# Patient Record
Sex: Male | Born: 1940
Health system: Southern US, Community
[De-identification: ages and names within clinical notes are randomized; demographics above are authoritative.]

## PROBLEM LIST (undated history)

## (undated) DIAGNOSIS — F1011 Alcohol abuse, in remission: Secondary | ICD-10-CM

## (undated) DIAGNOSIS — I219 Acute myocardial infarction, unspecified: Secondary | ICD-10-CM

## (undated) DIAGNOSIS — E119 Type 2 diabetes mellitus without complications: Secondary | ICD-10-CM

## (undated) DIAGNOSIS — I340 Nonrheumatic mitral (valve) insufficiency: Secondary | ICD-10-CM

## (undated) DIAGNOSIS — E785 Hyperlipidemia, unspecified: Secondary | ICD-10-CM

## (undated) DIAGNOSIS — J449 Chronic obstructive pulmonary disease, unspecified: Secondary | ICD-10-CM

## (undated) DIAGNOSIS — I509 Heart failure, unspecified: Secondary | ICD-10-CM

## (undated) DIAGNOSIS — I2510 Atherosclerotic heart disease of native coronary artery without angina pectoris: Secondary | ICD-10-CM

## (undated) DIAGNOSIS — E669 Obesity, unspecified: Secondary | ICD-10-CM

## (undated) DIAGNOSIS — I4892 Unspecified atrial flutter: Secondary | ICD-10-CM

## (undated) DIAGNOSIS — I4891 Unspecified atrial fibrillation: Secondary | ICD-10-CM

## (undated) DIAGNOSIS — M109 Gout, unspecified: Secondary | ICD-10-CM

## (undated) DIAGNOSIS — I639 Cerebral infarction, unspecified: Secondary | ICD-10-CM

## (undated) DIAGNOSIS — M1990 Unspecified osteoarthritis, unspecified site: Secondary | ICD-10-CM

## (undated) DIAGNOSIS — I10 Essential (primary) hypertension: Secondary | ICD-10-CM

## (undated) HISTORY — DX: Heart failure, unspecified: I50.9

## (undated) HISTORY — DX: Essential (primary) hypertension: I10

## (undated) HISTORY — DX: Hyperlipidemia, unspecified: E78.5

## (undated) HISTORY — PX: INGUINAL HERNIA REPAIR: SUR1180

## (undated) HISTORY — DX: Nonrheumatic mitral (valve) insufficiency: I34.0

## (undated) HISTORY — DX: Unspecified atrial fibrillation: I48.91

## (undated) HISTORY — DX: Atherosclerotic heart disease of native coronary artery without angina pectoris: I25.10

## (undated) HISTORY — DX: Alcohol abuse, in remission: F10.11

## (undated) HISTORY — DX: Unspecified atrial flutter: I48.92

## (undated) HISTORY — DX: Obesity, unspecified: E66.9

## (undated) HISTORY — PX: CATARACT EXTRACTION W/ INTRAOCULAR LENS  IMPLANT, BILATERAL: SHX1307

---

## 1985-07-12 DIAGNOSIS — I219 Acute myocardial infarction, unspecified: Secondary | ICD-10-CM

## 1985-07-12 HISTORY — PX: INCISION AND DRAINAGE ABSCESS ANAL: SUR669

## 1985-07-12 HISTORY — PX: CARDIAC CATHETERIZATION: SHX172

## 1985-07-12 HISTORY — DX: Acute myocardial infarction, unspecified: I21.9

## 1997-07-12 HISTORY — PX: CORONARY ARTERY BYPASS GRAFT: SHX141

## 1998-02-03 ENCOUNTER — Inpatient Hospital Stay (HOSPITAL_COMMUNITY): Admission: EM | Admit: 1998-02-03 | Discharge: 1998-02-15 | Payer: Self-pay | Admitting: Emergency Medicine

## 2000-01-10 DIAGNOSIS — I639 Cerebral infarction, unspecified: Secondary | ICD-10-CM

## 2000-01-10 HISTORY — PX: CAROTID ENDARTERECTOMY: SUR193

## 2000-01-10 HISTORY — DX: Cerebral infarction, unspecified: I63.9

## 2000-01-25 ENCOUNTER — Encounter: Payer: Self-pay | Admitting: Vascular Surgery

## 2000-01-27 ENCOUNTER — Ambulatory Visit (HOSPITAL_COMMUNITY): Admission: RE | Admit: 2000-01-27 | Discharge: 2000-01-27 | Payer: Self-pay | Admitting: Vascular Surgery

## 2000-01-27 ENCOUNTER — Encounter: Payer: Self-pay | Admitting: Vascular Surgery

## 2000-02-08 ENCOUNTER — Encounter: Payer: Self-pay | Admitting: Vascular Surgery

## 2000-02-08 ENCOUNTER — Inpatient Hospital Stay: Admission: RE | Admit: 2000-02-08 | Discharge: 2000-02-09 | Payer: Self-pay | Admitting: Vascular Surgery

## 2000-02-08 ENCOUNTER — Encounter (INDEPENDENT_AMBULATORY_CARE_PROVIDER_SITE_OTHER): Payer: Self-pay

## 2000-03-08 ENCOUNTER — Encounter: Payer: Self-pay | Admitting: Thoracic Surgery (Cardiothoracic Vascular Surgery)

## 2004-09-25 ENCOUNTER — Ambulatory Visit: Payer: Self-pay | Admitting: Cardiology

## 2004-09-26 ENCOUNTER — Encounter: Payer: Self-pay | Admitting: Cardiology

## 2004-09-30 ENCOUNTER — Encounter: Payer: Self-pay | Admitting: Cardiology

## 2004-11-09 ENCOUNTER — Ambulatory Visit: Payer: Self-pay | Admitting: Cardiology

## 2005-05-28 ENCOUNTER — Encounter: Payer: Self-pay | Admitting: Cardiology

## 2009-06-23 ENCOUNTER — Encounter: Payer: Self-pay | Admitting: Cardiology

## 2009-08-25 ENCOUNTER — Encounter: Payer: Self-pay | Admitting: Cardiology

## 2009-09-24 ENCOUNTER — Encounter (INDEPENDENT_AMBULATORY_CARE_PROVIDER_SITE_OTHER): Payer: Self-pay | Admitting: *Deleted

## 2009-09-24 ENCOUNTER — Ambulatory Visit: Payer: Self-pay | Admitting: Cardiology

## 2009-09-24 DIAGNOSIS — E785 Hyperlipidemia, unspecified: Secondary | ICD-10-CM

## 2009-09-24 DIAGNOSIS — E1169 Type 2 diabetes mellitus with other specified complication: Secondary | ICD-10-CM

## 2009-09-24 DIAGNOSIS — I509 Heart failure, unspecified: Secondary | ICD-10-CM | POA: Insufficient documentation

## 2009-09-24 DIAGNOSIS — J449 Chronic obstructive pulmonary disease, unspecified: Secondary | ICD-10-CM | POA: Insufficient documentation

## 2009-09-24 DIAGNOSIS — I2589 Other forms of chronic ischemic heart disease: Secondary | ICD-10-CM | POA: Insufficient documentation

## 2009-09-24 DIAGNOSIS — I080 Rheumatic disorders of both mitral and aortic valves: Secondary | ICD-10-CM

## 2009-09-24 DIAGNOSIS — I679 Cerebrovascular disease, unspecified: Secondary | ICD-10-CM | POA: Insufficient documentation

## 2009-09-24 DIAGNOSIS — I482 Chronic atrial fibrillation, unspecified: Secondary | ICD-10-CM

## 2009-09-24 DIAGNOSIS — I4892 Unspecified atrial flutter: Secondary | ICD-10-CM | POA: Insufficient documentation

## 2009-09-24 DIAGNOSIS — F1010 Alcohol abuse, uncomplicated: Secondary | ICD-10-CM | POA: Insufficient documentation

## 2009-09-24 DIAGNOSIS — I079 Rheumatic tricuspid valve disease, unspecified: Secondary | ICD-10-CM | POA: Insufficient documentation

## 2009-09-24 DIAGNOSIS — Z951 Presence of aortocoronary bypass graft: Secondary | ICD-10-CM

## 2009-09-24 DIAGNOSIS — E119 Type 2 diabetes mellitus without complications: Secondary | ICD-10-CM

## 2009-09-25 DIAGNOSIS — E669 Obesity, unspecified: Secondary | ICD-10-CM

## 2009-10-02 ENCOUNTER — Ambulatory Visit: Payer: Self-pay | Admitting: Cardiology

## 2009-10-02 ENCOUNTER — Encounter: Payer: Self-pay | Admitting: Cardiology

## 2010-08-11 NOTE — Assessment & Plan Note (Signed)
Summary: NP/LAST EST 2006   Visit Type:  Follow-up Referring Provider:  Dr. Leanna Battles Primary Provider:  Dr. Kirstie Peri  CC:  Arrhythmia.  History of Present Illness: The patient presents for follow up of his known he is status post bypass grafting in 1994. We last saw him in 2006. At that time he had atrial fibrillation. He apparently has been in this rhythm or flutter since that time and has been managed with anticoagulation. There has been some bradycardia noted. However, the patient does not notice this rhythm. He was referred here as he was recently noted to have an irregular heart rhythm. He does not feel this rhythm and denies any palpitations, presyncope or syncope. He does not report any chest discomfort, neck or arm discomfort. He does get dyspnea with exertion which is noticed more by his wife and by him. She thinks this is slowly progressive. He does do some exercising with walking or other activities about one half hour per day. He says he can tolerate this and he is not describing PND or orthopnea.  Preventive Screening-Counseling & Management  Alcohol-Tobacco     Smoking Status: quit > 6 months     Year Quit: 1987  Comments: smoked for about 30 yrs and quit in 1987. He quit chewing tobacco around 1988  Current Medications (verified): 1)  Lipitor 10 Mg Tabs (Atorvastatin Calcium) .... Take One Tablet By Mouth Daily. 2)  Glyburide 5 Mg Tabs (Glyburide) .... Take 1 Tablet By Mouth Two Times A Day 3)  Allopurinol 300 Mg Tabs (Allopurinol) .... Take 1 Tablet By Mouth Once A Day 4)  Quinapril Hcl 40 Mg Tabs (Quinapril Hcl) .... Take 1 Tablet By Mouth Once A Day 5)  Furosemide 40 Mg Tabs (Furosemide) .... Take One Tablet By Mouth Daily. 6)  Warfarin Sodium 5 Mg Tabs (Warfarin Sodium) .... Use As Directed By Anticoagulation Clinic 7)  Hydrocodone-Acetaminophen 5-500 Mg Tabs (Hydrocodone-Acetaminophen) .... As Needed Pain 8)  Fish Oil 1000 Mg Caps (Omega-3 Fatty Acids) ....  Take 1 Capsule By Mouth Two Times A Day 9)  Garlic Oil 1000 Mg Caps (Garlic) .... Take 1 Capsule By Mouth Two Times A Day 10)  Aspirin 81 Mg Tbec (Aspirin) .... Take One Tablet By Mouth Daily 11)  Amlodipine Besylate 5 Mg Tabs (Amlodipine Besylate) .... Take One Tablet By Mouth Daily 12)  Co Q-10 100 Mg Caps (Coenzyme Q10) .... Take 1 Capsule By Mouth Once A Day 13)  Prednisolone Acetate 1 % Susp (Prednisolone Acetate) .... Use Eye Drops As Directed 14)  Ketorolac Tromethamine 0.4 % Soln (Ketorolac Tromethamine) .... Use Eye Drops As Directed  Allergies (verified): 1)  * Some Type of Rhythm Medicaiton 2)  Augmentin  Comments:  Nurse/Medical Assistant: The patient's medications were reviewed with the patient and were updated in the Medication List. Pt brought a list of medications to office visit.  Cyril Loosen, RN, BSN (September 24, 2009 3:03 PM)  Past History:  Past Medical History: CHF (EF 37%) ATRIAL FIBRILLATION MITRAL VALVE INSUFF ATRIAL FLUTTER TOBACCO ABUSE (Previous) CAD DM  HYPERLIPIDEMIA UNSPECIFIED CEREBROVASCULAR DISEASE OBESITY HISTORY OF GOUT Hypertension Sleep apnea EtOH use (previous)  Past Surgical History: Right carotid endarterectomy with Dacron patch (July 2001) CABG (1999 x 4) Cataract Left anal hernia repair  Family History: Father died age 66 with MI Mother died age 3 unknown cause  Social History: FORMER TOBACCO STOPPED IN THE 80'S DISABLED FROM [employer] AFTER A STROKE 2001 [employer] EtOH Smoking  Status:  quit > 6 months  Review of Systems       Right heal pain.  Otherwise as stated in the history of present illness negative for all other systems.  Vital Signs:  Patient profile:   70 year old male Height:      70 inches Weight:      226 pounds BMI:     32.54 Pulse rate:   80 / minute BP sitting:   134 / 76  (left arm) Cuff size:   regular  Vitals Entered By: Cyril Loosen, RN, BSN (September 24, 2009 2:54 PM)  Nutrition  Counseling: Patient's BMI is greater than 25 and therefore counseled on weight management options. CC: Arrhythmia Comments Pt states he had CABG in '99 but hasn't had f/u since then b/c he's been doing well. He states he was told at the time of his recent eye surgery that his hear beat was irregular.    Physical Exam  General:  Well developed, well nourished, in no acute distress. Head:  normocephalic and atraumatic Eyes:  PERRLA/EOM intact; conjunctiva and lids normal. Mouth:  Oral mucosa normal. Neck:  Neck supple, no JVD. No masses, thyromegaly or abnormal cervical nodes. Chest Wall:  well-healed surgical scar Lungs:  Clear bilaterally to auscultation and percussion. Abdomen:  Bowel sounds positive; abdomen soft and non-tender without masses, organomegaly, or hernias noted. No hepatosplenomegaly, obese Msk:  Back normal, normal gait. Muscle strength and tone normal. Extremities:  No clubbing or cyanosis. Neurologic:  Alert and oriented x 3. Skin:  Intact without lesions or rashes. Cervical Nodes:  no significant adenopathy Axillary Nodes:  no significant adenopathy Psych:  Normal affect.   Detailed Cardiovascular Exam  Neck    Carotids: Carotids full and equal bilaterally without bruits.      Neck Veins: Normal, no JVD.    Heart    Inspection: no deformities or lifts noted.      Palpation: normal PMI with no thrills palpable.      Auscultation: regular rate and rhythm, S1, S2 without murmurs, rubs, gallops, or clicks.    Vascular    Abdominal Aorta: no palpable masses, pulsations, or audible bruits.      Femoral Pulses: normal femoral pulses bilaterally.      Pedal Pulses: normal pedal pulses bilaterally.      Radial Pulses: normal radial pulses bilaterally.      Peripheral Circulation: no clubbing, cyanosis, or edema noted with normal capillary refill.     EKG  Procedure date:  09/25/2009  Findings:      atrial fibrillation, slow ventricular rates, axis within  normal limits, intervals within normal limits, no acute ST-T wave changes.  Impression & Recommendations:  Problem # 1:  ATRIAL FIBRILLATION (ICD-427.31) The patient does not feel this rhythm. He tolerates Coumadin. He has a slow rate but no presyncope or syncope. No change in therapy is indicated. Orders: EKG w/ Interpretation (93000) Nuclear Med (Nuc Med)  Problem # 2:  CAD (ICD-414.00) He has CAD status post CABG. He has dyspnea. He never really had chest discomfort as an anginal equivalent. It was always shortness of breath. His bypass grafts are now 72 years old. He needs further testing with stress perfusion imaging. He does have an abnormal baseline study to compare this to in 2006. Orders: Nuclear Med (Nuc Med)  Problem # 3:  HYPERLIPIDEMIA (ICD-272.4) I don't see a recent lipid profile. I'll defer to his primary care with a goal LDL less than 70 and  HDL greater than 40.  Problem # 4:  OBESITY, UNSPECIFIED (ICD-278.00) He and I had a long discussion about weight loss with diet and exercise.  Other Orders: Carotid Duplex (Carotid Duplex)  Patient Instructions: 1)  Your physician has requested that you have an exercise stress myoview.  For further information please visit [email or web address]  Please follow instruction sheet, as given. 2)  Your physician recommends that you continue on your current medications as directed. Please refer to the Current Medication list given to you today. 3)  Your physician wants you to follow-up in: 12months. You will receive a reminder letter in the mail about two months in advance. If you don't receive a letter, please call our office to schedule the follow-up appointment. 4)  Your physician has requested that you have a carotid duplex. This test is an ultrasound of the carotid arteries in your neck. It looks at blood flow through these arteries that supply the brain with blood. Allow one hour for this exam. There are no restrictions or special  instructions.

## 2010-08-11 NOTE — Letter (Signed)
Summary: [facility] D/C DR. Weyman Pedro  [facility] D[facility]C DR. Weyman Pedro   Imported By: Zachary George 09/24/2009 14:02:49  _____________________________________________________________________  External Attachment:    Type:   Image     Comment:   External Document

## 2010-08-11 NOTE — Op Note (Signed)
Summary: Operative Report  Operative Report   Imported By: Zachary George 09/24/2009 14:03:20  _____________________________________________________________________  External Attachment:    Type:   Image     Comment:   External Document

## 2010-08-11 NOTE — Consult Note (Signed)
Summary: CARDIOLOGY CONSULT/ [facility]  CARDIOLOGY CONSULT/ [facility]   Imported By: Zachary George 09/24/2009 13:51:04  _____________________________________________________________________  External Attachment:    Type:   Image     Comment:   External Document

## 2010-08-11 NOTE — Letter (Signed)
Summary: Pharmacist, community at [facility]. [address]   [place], [place] [address]   Phone: [phone]  Fax: [phone]      [facility] Cardiovascular Services  Cardiolite Stress Test     [facility]  Your doctor has ordered a Cardiolite Stress Test to help determine the condition of your heart during stress. If you take blood pressure medicine, ask your doctor if you should take it the day of your test. You may take your medication the day of your test. You should not have anything to eat or drink at least 4 hours before your test is scheduled, and no caffeine (coffee, tea, decaf. or chocolate) for 24 hours before your test.   You will need to register at the Outpatient/Main Entrance at the hospital 30 minutes before your appointment time. It is a good idea to bring a copy of your order with you. They will direct you to the Diagnostic Imaging (Radiology) Department.  You will be asked to undress from the waist up and be given a hospital gown to wear, so dress comfortably from the waist down, for example:    Sweat pants, shorts or skirt   Rubber-soled lace up shoes (i.e. tennis shoes)  Plan on about three hours from registration to release from the hospital.    Date of Test:              Time of Test

## 2010-08-11 NOTE — Op Note (Signed)
Summary: Operative Report  Operative Report   Imported By: Zachary George 09/24/2009 14:42:35  _____________________________________________________________________  External Attachment:    Type:   Image     Comment:   External Document

## 2010-08-11 NOTE — Letter (Signed)
Summary: Internal Other/ PATIENT HISTORY FORM  Internal Other/ PATIENT HISTORY FORM   Imported By: Dorise Hiss 09/26/2009 12:10:22  _____________________________________________________________________  External Attachment:    Type:   Image     Comment:   External Document

## 2010-08-11 NOTE — Letter (Signed)
Summary: Discharge Summary  Discharge Summary   Imported By: Zachary George 09/24/2009 14:41:26  _____________________________________________________________________  External Attachment:    Type:   Image     Comment:   External Document

## 2010-11-09 ENCOUNTER — Ambulatory Visit (INDEPENDENT_AMBULATORY_CARE_PROVIDER_SITE_OTHER): Payer: Medicare Other | Admitting: Cardiology

## 2010-11-09 ENCOUNTER — Encounter: Payer: Self-pay | Admitting: Cardiology

## 2010-11-09 ENCOUNTER — Encounter: Payer: Self-pay | Admitting: *Deleted

## 2010-11-09 VITALS — BP 143/75 | HR 52 | Ht 70.0 in | Wt 226.0 lb

## 2010-11-09 DIAGNOSIS — E785 Hyperlipidemia, unspecified: Secondary | ICD-10-CM

## 2010-11-09 DIAGNOSIS — I2510 Atherosclerotic heart disease of native coronary artery without angina pectoris: Secondary | ICD-10-CM

## 2010-11-09 DIAGNOSIS — I4891 Unspecified atrial fibrillation: Secondary | ICD-10-CM

## 2010-11-09 DIAGNOSIS — E669 Obesity, unspecified: Secondary | ICD-10-CM

## 2010-11-09 NOTE — Progress Notes (Signed)
HPI The patient presents for followup of atrial fibrillation and known coronary disease. Since I last saw him he has had no new cardiovascular complaints. He does not notice his atrial fibrillation. He exercises one half hour daily. With this he denies any chest pressure, neck or arm discomfort. He denies any palpitations, and has had no presyncope. He has had no syncopal episodes. There is no shortness of breath, PND or orthopnea though he did have a pneumonia recently.  Allergies  Allergen Reactions  . WJX:[id number]+HYQMVHQIO+[id number] Acid+Aspartame     Current Outpatient Prescriptions  Medication Sig Dispense Refill  . allopurinol (ZYLOPRIM) 300 MG tablet Take 300 mg by mouth daily.        Marland Kitchen amLODipine (NORVASC) 5 MG tablet Take 5 mg by mouth daily.        Marland Kitchen aspirin 81 MG tablet Take 81 mg by mouth daily.        Marland Kitchen atorvastatin (LIPITOR) 10 MG tablet Take 10 mg by mouth daily.        . Cinnamon 500 MG TABS Take 2 tablets by mouth daily.        . Coenzyme Q10 60 MG TABS Take 2 tablets by mouth daily.        . furosemide (LASIX) 40 MG tablet Take 40 mg by mouth daily.        Marland Kitchen glyBURIDE (DIABETA) 5 MG tablet Take 5 mg by mouth 2 (two) times daily with a meal.        . HYDROcodone-acetaminophen (VICODIN) 5-500 MG per tablet Take 1 tablet by mouth every 6 (six) hours as needed.        . Omega-3 Fatty Acids (FISH OIL) 1000 MG CAPS Take 1 capsule by mouth 2 (two) times daily.        . quinapril (ACCUPRIL) 40 MG tablet Take 40 mg by mouth at bedtime.        Marland Kitchen warfarin (COUMADIN) 5 MG tablet Take 5 mg by mouth as directed.        Marland Kitchen DISCONTD: Coenzyme Q10 (COQ-10) 100 MG CAPS Take 1 capsule by mouth daily.        Marland Kitchen DISCONTD: Garlic Oil 1000 MG CAPS Take 1 capsule by mouth 2 (two) times daily.        Marland Kitchen DISCONTD: ketorolac (ACULAR) 0.4 % SOLN Place 1 drop into both eyes 4 (four) times daily.        Marland Kitchen DISCONTD: prednisoLONE acetate (PRED FORTE) 1 % ophthalmic suspension Place 1 drop into both eyes  4 (four) times daily.          Past Medical History  Diagnosis Date  . Congestive heart failure, unspecified     (EF 37%)  . Atrial fibrillation   . Atrial flutter   . Mitral valve insufficiency   . Tobacco abuse     PREVIOUS  . Coronary atherosclerosis of native coronary artery   . Type II or unspecified type diabetes mellitus without mention of complication, not stated as uncontrolled   . Hyperlipidemia   . CVA (cerebral infarction)     UNSPECIFIED  . Obesity   . Gout     HSITORY OF  . Unspecified essential hypertension   . Sleep apnea   . History of ETOH abuse     Past Surgical History  Procedure Date  . Carotid endarterectomy JULY 2001    RIGHT WITH DACRON PATCH  . Coronary artery bypass graft 1999    X 4  . Cataract extraction   .  Hernia repair     LEFT ANAL    ROS:  As stated in the HPI and negative for all other systems.  PHYSICAL EXAM BP 143/75  Pulse 52  Ht 5\' 10"  (1.778 m)  Wt 226 lb (102.513 kg)  BMI 32.43 kg/m2 GENERAL:  Well appearing HEENT:  Pupils equal round and reactive, fundi not visualized, oral mucosa unremarkable NECK:  No jugular venous distention, waveform within normal limits, carotid upstroke brisk and symmetric, no bruits, no thyromegaly, CEA scar LYMPHATICS:  No cervical, inguinal adenopathy LUNGS:  Clear to auscultation bilaterally BACK:  No CVA tenderness CHEST:  Well healed sternotomy scar. HEART:  PMI not displaced or sustained,S1 and S2 within normal limits, no S3, no S4, no clicks, no rubs, no murmurs, irregular ABD:  Flat, positive bowel sounds normal in frequency in pitch, no bruits, no rebound, no guarding, no midline pulsatile mass, no hepatomegaly, no splenomegaly EXT:  2 plus pulses throughout, no edema, no cyanosis no clubbing SKIN:  No rashes no nodules NEURO:  Cranial nerves II through XII grossly intact, motor grossly intact throughout PSYCH:  Cognitively intact, oriented to person place and time   EKG:  Atrial  fibrillation with a slow ventricular response, left axis deviation, left anterior fascicular block. No significant change since previous.  ASSESSMENT AND PLAN

## 2010-11-09 NOTE — Assessment & Plan Note (Signed)
He tolerates Coumadin. He does have a slow rate but doesn't feel this. No change in therapy is indicated.

## 2010-11-09 NOTE — Patient Instructions (Signed)
Your physician you to follow up in 1 year. You will receive a reminder letter in the mail one-two months in advance. If you don't receive a letter, please call our office to schedule the follow-up appointment. Your physician recommends that you continue on your current medications as directed. Please refer to the Current Medication list given to you today. 

## 2010-11-09 NOTE — Assessment & Plan Note (Signed)
Per Kirstie Peri, MD

## 2010-11-09 NOTE — Assessment & Plan Note (Signed)
The patient had a stress perfusion study last year demonstrating an EF of 40% with a prior inferolateral infarct. No further testing is indicated. No change in therapy is indicated. He will continue with risk reduction.

## 2010-11-09 NOTE — Assessment & Plan Note (Signed)
The patient understands the need to lose weight with diet and exercise. We have discussed specific strategies for this.  

## 2010-11-27 NOTE — Discharge Summary (Signed)
[facility]  Patient:    Adrian Barrett                       MRN: [id number] Adm. Date:  [id number] Disc. Date: [id number] Attending:  Bennye Alm Dictator:   Sherrie George, P.A. CC:         Weyman Pedro, M.D.             Rollene Rotunda, M.D. [facility]                           Discharge Summary  DATE OF BIRTH:  1941-03-06  ADMISSION DIAGNOSES: 1. Status post right posterior middle cerebral artery cerebrovascular accident    with 80-90% right internal carotid artery stenosis. 2. Coronary artery disease, status post myocardial infarction in 1984 and    1995, status post coronary artery bypass grafting, January 21, 1998,    Dr. Cornelius Moras. 3. History of cardiac arrhythmias with atrial fibrillation, sustained    ventricular tachycardia. 4. Chronic obstructive pulmonary disease with a history of tobacco use. 5. Adult onset diabetes mellitus, non-insulin dependent. 6. History of gout.  DISCHARGE DIAGNOSES: 1. Status post right posterior middle cerebral artery cerebrovascular accident    with 80-90% right internal carotid artery stenosis. 2. Coronary artery disease, status post myocardial infarction in 1984 and    1995, status post coronary artery bypass grafting, January 21, 1998,    Dr. Cornelius Moras. 3. History of cardiac arrhythmias with atrial fibrillation, sustained    ventricular tachycardia. 4. Chronic obstructive pulmonary disease with a history of tobacco use. 5. Adult onset diabetes mellitus, non-insulin dependent. 6. History of gout.  PROCEDURES:  Right carotid endarterectomy with Dacron patch angioplasty, February 08, 2000, by Dr. Edilia Bo.  BRIEF HISTORY:  The patient is a 70 year old white male, medical patient of Dr. Weyman Pedro and Dr. Rollene Rotunda, who while walking in July had an episode where his legs simply gave way.  The patient did not have any pain or any other symptoms.  He eventually struggled to a tree.  His arm strength was felt  to be normal.  He had some discomfort in the frontal part of head as well as some dizziness.  He eventually made his way back to the house.  He regained his strength over 24 hours but still felt a bit off balance.  He was subsequently seen by his physicians and an MRI was done and this was consistent with a right middle cerebral artery CVA.  Findings of a CT scan dated January 19, 2000, showed an impression consistent with a subcu infarction involving the right middle posterior cerebral artery distribution.  There is luxury perfusion and a very small amount of petechial hemorrhages.  Also noted were some soft tissue swelling along the posterior right calvarium possibly from previous trauma.  Dr. Eliberto Ivory referred the patient to CVTS and Dr. Waverly Ferrari.  Doppler studies revealed right carotid stenosis and this was repeated in the [facility] office which confirmed an 80% lesion in the ICA. Dr. Edilia Bo noted the patient had a very large neck and due to the anatomical findings on the duplex, he felt the lesion might be more severe than that and recommended arteriogram to delineate his anatomy.  This was performed and based on the study results he recommended right carotid endarterectomy.  The patient and family discussed this with Dr. Edilia Bo in detail and his is admitted for elective right  carotid endarterectomy.  PAST MEDICAL HISTORY:  Includes coronary artery disease as noted above.  He has had two infarctions in 1994 and 1999.  He subsequently underwent coronary artery bypass grafting x 4 on January 21, 1998, by Dr. Cornelius Moras.  He has also had a left inguinal hernia.  He has had multiple cardiac arrhythmias including sustained ventricular tachycardia and atrial fibrillation; currently he is in sinus rhythm.  Other diagnosis include COPD with a history of tobacco use, history of alcohol use, history of hyperlipidemia, pneumonia, mitral regurgitation, history of right ventricular dysfunction, history  of diabetes, type 2, non-insulin dependent, and gout.  ALLERGIES:  AUGMENTIN.  MEDICATIONS ON ADMISSION: 1. Colchicine ______ 2. Allopurinol 300 mg q.d. 3. Accupril 10 mg b.i.d. 4. Atenolol 50 mg q.d. 5. Glyburide 5 mg q.d. 6. Plavix 75 mg q.d. 7. Tylenol No. 3 p.r.n.  For further history and physical, please see the dictated note.  HOSPITAL COURSE:  The patient was admitted and taken to the operating room where he underwent the procedure described above.  He tolerated the procedure well and returned to the step-down care unit in satisfactory condition.  He had a good evening.  The first postoperative morning he was neurologically intact.  Wounds looked good.  He was mobile and was ambulating, was able to eat, drink, walk and void.  He was subsequently discharged home on February 09, 2000.  DISCHARGE MEDICATIONS:  Were his preadmission he was on before.  He was also given Tylox one or two p.o. q.4h. p.r.n. for pain.  FOLLOWUP:  He is scheduled to return to see Dr. Edilia Bo in two weeks.  DISCHARGE ACTIVITY:  Light to moderate.  No lifting over 10 pounds.  No driving.  No strenuous activity.  CONDITION ON DISCHARGE:  Improving. DD:  03/08/00 TD:  03/09/00 Job: [id number]

## 2010-11-27 NOTE — Op Note (Signed)
[facility]  Patient:    Adrian Barrett                       MRN: [id number] Proc. Date: 02/08/00 Adm. Date:  [id number] Disc. Date: [id number] Attending:  Bennye Alm CC:         Di Kindle. Edilia Bo, M.D.   Operative Report  PREOPERATIVE DIAGNOSIS:  Symptomatic right carotid stenosis.  POSTOPERATIVE DIAGNOSIS:  Symptomatic right carotid stenosis.  PROCEDURE PERFORMED:  Right carotid endarterectomy with Dacron patch angioplasty.  SURGEON:  Di Kindle. Edilia Bo, M.D.  ASSISTANT:  Steele Sizer Chi, R.N., F.N.A.  ANESTHESIA:  General.  BRIEF HISTORY:  This is a 70 year old gentleman who had developed the sudden onset of weakness in both lower extremities although his symptoms were more pronounced on the left side.  It was felt that these symptoms could be consistent with a right hemispheric transient ischemic attack.  He had a carotid Doppler study done which showed evidence of a tight greater than 80% right carotid stenosis.  It was very difficult to see distally as the artery was quite deep and rotated posteriorly and an arteriogram was obtained to be sure that this was surgically accessible which confirmed the duplex findings. He was brought in for elective right carotid endarterectomy in order to lower his risk of stroke.  DESCRIPTION OF PROCEDURE:  The patient was taken to the operating room and sedated by anesthesia.  The entire right neck and upper chest were prepped and draped in the usual sterile fashion.  Of note, he had a very deep neck and his neck did not turn well.  A longitudinal incision was made along the anterior border of the sternocleidomastoid and dissection carried down to the common carotid artery which was dissected free and controlled with a Rumel tourniquet.  The internal carotid artery was rotated posteriorly and I had to divide the superior thyroid artery in order to rotate the external carotid artery  laterally so that I could get access to this very deep internal carotid artery.  I was able to control the artery above the plaque with a blue vessel loop.  The external carotid artery was controlled with a blue vessel loop. The patient was then systemically heparinized and then the internal, external and common carotid arteries were clamped and a longitudinal arteriotomy made in the common carotid artery.  This was extended through the plaque into the internal carotid artery.  A 10 Bard shunt was placed into the internal carotid artery, back bled and then placed into the common carotid artery and secured with a Rumel tourniquet.  Flow was reestablished to the shunt.  An endarterectomy plane was established proximally and the plaque was sharply divided.  An eversion endarterectomy was performed of the external carotid artery.  Distally, there was a nice taper in the plaque.  One tacking suture was placed with 6-0 Prolene.  The Dacron patch was then sewn using continuous 6-0 Prolene suture.  Upon completing the patch closure, the shunt was removed, the vessels back bled and flushed appropriately, and the anastomosis completed.  Flow was reestablished first to the external carotid artery and then to the internal carotid artery.  At the completion, there was a good Doppler signal distal to the patch and a good pulse.  Hemostasis was obtained in the wound.  The wound was closed with a deep layer of 3-0 Vicryl, the platysma was closed with running 3-0 Vicryl and the skin was closed with  a 4-0 subcuticular stitch.  A sterile dressing was applied.  The patient tolerated the procedure well and was transferred to the recovery room in satisfactory condition.  All needle and sponge counts were correct. DD:  02/08/00 TD:  02/09/00 Job: [id number]

## 2011-12-14 ENCOUNTER — Encounter: Payer: Self-pay | Admitting: Cardiology

## 2011-12-14 ENCOUNTER — Ambulatory Visit (INDEPENDENT_AMBULATORY_CARE_PROVIDER_SITE_OTHER): Payer: Medicare Other | Admitting: Cardiology

## 2011-12-14 VITALS — BP 151/82 | HR 61 | Resp 16 | Ht 71.0 in | Wt 232.0 lb

## 2011-12-14 DIAGNOSIS — I10 Essential (primary) hypertension: Secondary | ICD-10-CM

## 2011-12-14 DIAGNOSIS — R5383 Other fatigue: Secondary | ICD-10-CM

## 2011-12-14 DIAGNOSIS — I2510 Atherosclerotic heart disease of native coronary artery without angina pectoris: Secondary | ICD-10-CM

## 2011-12-14 DIAGNOSIS — I6529 Occlusion and stenosis of unspecified carotid artery: Secondary | ICD-10-CM

## 2011-12-14 DIAGNOSIS — R5381 Other malaise: Secondary | ICD-10-CM

## 2011-12-14 DIAGNOSIS — I4891 Unspecified atrial fibrillation: Secondary | ICD-10-CM

## 2011-12-14 DIAGNOSIS — I509 Heart failure, unspecified: Secondary | ICD-10-CM

## 2011-12-14 DIAGNOSIS — E669 Obesity, unspecified: Secondary | ICD-10-CM

## 2011-12-14 NOTE — Assessment & Plan Note (Signed)
Because of this he will stop his beta blocker. I will check a TSH and CBC. Further evaluation will be based on these results.

## 2011-12-14 NOTE — Assessment & Plan Note (Signed)
The patient understands the need to lose weight with diet and exercise. We have discussed specific strategies for this.  

## 2011-12-14 NOTE — Patient Instructions (Signed)
Your physician recommends that you schedule a follow-up appointment in: 6 months. You will receive a reminder letter in the mail in about 4 months reminding you to call and schedule your appointment. If you don't receive this letter, please contact our office.  Your physician has recommended you make the following change in your medication: stop metoprolol.  Your physician has requested that you have a carotid duplex. This test is an ultrasound of the carotid arteries in your neck. It looks at blood flow through these arteries that supply the brain with blood. Allow one hour for this exam. There are no restrictions or special instructions.   Your physician recommends that you return for lab work in: today at [facility].

## 2011-12-14 NOTE — Progress Notes (Signed)
HPI The patient presents for followup of atrial fibrillation and known coronary disease. Since I last saw him he had problems with dizziness.  He had an echocardiogram which demonstrated an EF of 50% (slightly better than previous) and no obvious cause for the dizziness. His wife weeks he hasn't interfered problem. Apparently his blood pressure was elevated so metoprolol was started. However, he and his wife both report that he has had increased sluggishness and irritability since starting that medication. He's also had a problem with weight gain as he's had a heel spur and has not been able to be as active. He thinks this has contributed to some of his fatigue. He denies any chest pressure, neck or arm discomfort. He has had no palpitations, presyncope or syncope. He denies any PND or orthopnea. He's had no new swelling.  Allergies  Allergen Reactions  . Amoxicillin-Pot Clavulanate     Current Outpatient Prescriptions  Medication Sig Dispense Refill  . allopurinol (ZYLOPRIM) 300 MG tablet Take 300 mg by mouth daily.        Marland Kitchen amLODipine (NORVASC) 5 MG tablet Take 5 mg by mouth daily.        Marland Kitchen aspirin 81 MG tablet Take 81 mg by mouth daily.        Marland Kitchen atorvastatin (LIPITOR) 10 MG tablet Take 10 mg by mouth daily.        . Cinnamon 500 MG TABS Take 2 tablets by mouth daily.        . Coenzyme Q10 60 MG TABS Take 2 tablets by mouth daily.        . furosemide (LASIX) 40 MG tablet Take 20 mg by mouth daily.       Marland Kitchen glyBURIDE (DIABETA) 5 MG tablet Take 5 mg by mouth 2 (two) times daily with a meal.        . HYDROcodone-acetaminophen (VICODIN) 5-500 MG per tablet Take 1 tablet by mouth every 6 (six) hours as needed.        . metoprolol succinate (TOPROL-XL) 25 MG 24 hr tablet Take 12.5 mg by mouth daily.      . Omega-3 Fatty Acids (FISH OIL) 1000 MG CAPS Take 1 capsule by mouth 2 (two) times daily.        . quinapril (ACCUPRIL) 40 MG tablet Take 40 mg by mouth at bedtime.        Marland Kitchen warfarin (COUMADIN)  5 MG tablet Take 5 mg by mouth as directed.          Past Medical History  Diagnosis Date  . Congestive heart failure, unspecified     (EF 37%)  . Atrial fibrillation   . Atrial flutter   . Mitral valve insufficiency   . Tobacco abuse     PREVIOUS  . Coronary atherosclerosis of native coronary artery   . Type II or unspecified type diabetes mellitus without mention of complication, not stated as uncontrolled   . Hyperlipidemia   . CVA (cerebral infarction)     UNSPECIFIED  . Obesity   . Gout     HSITORY OF  . Unspecified essential hypertension   . Sleep apnea   . History of ETOH abuse     Past Surgical History  Procedure Date  . Carotid endarterectomy JULY 2001    RIGHT WITH DACRON PATCH  . Coronary artery bypass graft 1999    X 4  . Cataract extraction   . Hernia repair     LEFT ANAL  ROS:  As stated in the HPI and negative for all other systems.  PHYSICAL EXAM Resp 16  Ht 5\' 11"  (1.803 m)  Wt 232 lb (105.235 kg)  BMI 32.36 kg/m2 GENERAL:  Well appearing HEENT:  Pupils equal round and reactive, fundi not visualized, oral mucosa unremarkable NECK:  No jugular venous distention, waveform within normal limits, carotid upstroke brisk and symmetric, no bruits, no thyromegaly, CEA scar LYMPHATICS:  No cervical, inguinal adenopathy LUNGS:  Clear to auscultation bilaterally BACK:  No CVA tenderness CHEST:  Well healed sternotomy scar. HEART:  PMI not displaced or sustained,S1 and S2 within normal limits, no S3, no S4, no clicks, no rubs, no murmurs, irregular ABD:  Flat, positive bowel sounds normal in frequency in pitch, no bruits, no rebound, no guarding, no midline pulsatile mass, no hepatomegaly, no splenomegaly EXT:  2 plus pulses throughout, no edema, no cyanosis no clubbing SKIN:  No rashes no nodules NEURO:  Cranial nerves II through XII grossly intact, motor grossly intact throughout PSYCH:  Cognitively intact, oriented to person place and time   EKG:   Atrial fibrillation with a slow ventricular response, 55, left axis deviation, left anterior fascicular block. No significant change since previous.  12/14/2011   ASSESSMENT AND PLAN

## 2011-12-14 NOTE — Assessment & Plan Note (Signed)
He seems to be euvolemic.  At this point, no change in therapy is indicated.  We have reviewed salt and fluid restrictions.  No further cardiovascular testing is indicated.   

## 2011-12-14 NOTE — Assessment & Plan Note (Signed)
He had carotid endarterectomy on the right in the past and has 50% stenosis on the left. I will schedule followup Doppler.

## 2011-12-14 NOTE — Assessment & Plan Note (Signed)
He has had no new symptoms since his perfusion study in 2011 which demonstrated old inferior and anterolateral infarct. He will continue with risk reduction.

## 2011-12-14 NOTE — Assessment & Plan Note (Signed)
The patient  tolerates this rhythm and rate control and anticoagulation. We will continue with the meds as listed.  

## 2011-12-14 NOTE — Assessment & Plan Note (Signed)
This is slightly increased from previous. However, be stopping the beta blocker as above. He will keep a blood pressure diary.

## 2011-12-20 ENCOUNTER — Telehealth: Payer: Self-pay | Admitting: *Deleted

## 2011-12-20 NOTE — Telephone Encounter (Signed)
Patient informed. 

## 2011-12-20 NOTE — Telephone Encounter (Signed)
Message copied by Eustace Moore on Mon Dec 20, 2011  9:30 AM ------      Message from: Rollene Rotunda      Created: Fri Dec 17, 2011  1:24 PM       [facility]

## 2011-12-30 ENCOUNTER — Encounter (INDEPENDENT_AMBULATORY_CARE_PROVIDER_SITE_OTHER): Payer: Medicare Other

## 2011-12-30 DIAGNOSIS — I6529 Occlusion and stenosis of unspecified carotid artery: Secondary | ICD-10-CM

## 2012-01-05 ENCOUNTER — Telehealth: Payer: Self-pay | Admitting: *Deleted

## 2012-01-05 NOTE — Telephone Encounter (Signed)
Patient informed. Copy sent to Dr. Margaretmary Eddy office.

## 2012-01-05 NOTE — Telephone Encounter (Signed)
Left message for patient to call office.  

## 2012-01-05 NOTE — Telephone Encounter (Signed)
Message copied by Eustace Moore on Wed Jan 05, 2012  8:44 AM ------      Message from: Rollene Rotunda      Created: Fri Dec 31, 2011  9:08 PM       Follow up in 2 years.  Mild plaque.  Call Mr. Barretto with the results and send results to [facility], [place]

## 2012-07-10 ENCOUNTER — Encounter: Payer: Self-pay | Admitting: Cardiology

## 2012-07-10 ENCOUNTER — Ambulatory Visit (INDEPENDENT_AMBULATORY_CARE_PROVIDER_SITE_OTHER): Payer: Medicare Other | Admitting: Cardiology

## 2012-07-10 VITALS — BP 131/70 | HR 58 | Ht 71.0 in | Wt 229.8 lb

## 2012-07-10 DIAGNOSIS — I080 Rheumatic disorders of both mitral and aortic valves: Secondary | ICD-10-CM

## 2012-07-10 DIAGNOSIS — I509 Heart failure, unspecified: Secondary | ICD-10-CM

## 2012-07-10 DIAGNOSIS — I2510 Atherosclerotic heart disease of native coronary artery without angina pectoris: Secondary | ICD-10-CM

## 2012-07-10 DIAGNOSIS — I4892 Unspecified atrial flutter: Secondary | ICD-10-CM

## 2012-07-10 DIAGNOSIS — I10 Essential (primary) hypertension: Secondary | ICD-10-CM

## 2012-07-10 DIAGNOSIS — E785 Hyperlipidemia, unspecified: Secondary | ICD-10-CM

## 2012-07-10 DIAGNOSIS — I4891 Unspecified atrial fibrillation: Secondary | ICD-10-CM

## 2012-07-10 NOTE — Progress Notes (Signed)
HPI The patient presents for followup of atrial fibrillation and known coronary disease. Since I last saw him he had problems with dizziness. Because of that I stopped beta blockers which had recently been started. He says his blood pressure has been well controlled. He thinks that his dizziness is better. He is not describing any fatigue. He's had no presyncope or syncope. He denies any chest pressure, neck or arm discomfort. He has had no palpitations. He has had no new shortness of breath, PND or orthopnea.  Allergies  Allergen Reactions  . Amoxicillin-Pot Clavulanate     Current Outpatient Prescriptions  Medication Sig Dispense Refill  . allopurinol (ZYLOPRIM) 300 MG tablet Take 300 mg by mouth daily.        Marland Kitchen amLODipine (NORVASC) 5 MG tablet Take 5 mg by mouth daily.        Marland Kitchen aspirin 81 MG tablet Take 81 mg by mouth daily.        Marland Kitchen atorvastatin (LIPITOR) 10 MG tablet Take 10 mg by mouth daily.        . Cinnamon 500 MG TABS Take 2 tablets by mouth daily.        . Coenzyme Q10 60 MG TABS Take 2 tablets by mouth daily.        . furosemide (LASIX) 40 MG tablet Take 20 mg by mouth daily.       Marland Kitchen glyBURIDE (DIABETA) 5 MG tablet Take 5 mg by mouth 2 (two) times daily with a meal.        . HYDROcodone-acetaminophen (VICODIN) 5-500 MG per tablet Take 1 tablet by mouth every 6 (six) hours as needed.        . Omega-3 Fatty Acids (FISH OIL) 1000 MG CAPS Take 1 capsule by mouth 2 (two) times daily.        . quinapril (ACCUPRIL) 40 MG tablet Take 40 mg by mouth at bedtime.        Marland Kitchen warfarin (COUMADIN) 5 MG tablet Take 5 mg by mouth as directed.          Past Medical History  Diagnosis Date  . Congestive heart failure, unspecified     (EF 37%)  . Atrial fibrillation   . Atrial flutter   . Mitral valve insufficiency   . Tobacco abuse     PREVIOUS  . Coronary atherosclerosis of native coronary artery   . Type II or unspecified type diabetes mellitus without mention of complication, not  stated as uncontrolled   . Hyperlipidemia   . CVA (cerebral infarction)     UNSPECIFIED  . Obesity   . Gout     HSITORY OF  . Unspecified essential hypertension   . Sleep apnea   . History of ETOH abuse     Past Surgical History  Procedure Date  . Carotid endarterectomy JULY 2001    RIGHT WITH DACRON PATCH  . Coronary artery bypass graft 1999    X 4  . Cataract extraction   . Hernia repair     LEFT ANAL    ROS:  As stated in the HPI and negative for all other systems.  PHYSICAL EXAM BP 131/70  Pulse 58  Ht 5\' 11"  (1.803 m)  Wt 229 lb 12.8 oz (104.237 kg)  BMI 32.05 kg/m2 GENERAL:  Well appearing HEENT:  Pupils equal round and reactive, fundi not visualized, oral mucosa unremarkable NECK:  No jugular venous distention, waveform within normal limits, carotid upstroke brisk and symmetric, no bruits, no  thyromegaly, CEA scar LYMPHATICS:  No cervical, inguinal adenopathy LUNGS:  Clear to auscultation bilaterally CHEST:  Well healed sternotomy scar. HEART:  PMI not displaced or sustained,S1 and S2 within normal limits, no S3, no S4, no clicks, no rubs, no murmurs, irregular ABD:  Flat, positive bowel sounds normal in frequency in pitch, no bruits, no rebound, no guarding, no midline pulsatile mass, no hepatomegaly, no splenomegaly EXT:  2 plus pulses throughout, no edema, no cyanosis no clubbing     EKG:  Atrial fibrillation with a slow ventricular response, 58, left axis deviation, left anterior fascicular block. No significant change since previous.  07/10/2012   ASSESSMENT AND PLAN  ATRIAL FIBRILLATION -  The patient tolerates this rhythm and rate control and anticoagulation. He does well with warfarin and is not interested in changing to a newer therapy.  CAD -  He has had no new symptoms since his perfusion study in 2011 which demonstrated old inferior and anterolateral infarct. He will continue with risk reduction.  CHF -  He seems to be euvolemic. At this  point, no change in therapy is indicated. We have reviewed salt and fluid restrictions. No further cardiovascular testing is indicated.   Obesity, unspecified -  The patient understands the need to lose weight with diet and exercise. We have discussed specific strategies for this in the past.  Carotid stenosis -  He had on earlier this year. We will followed up again in 2 years.ly mild stenosis when we check this  Fatigue -  Fatigue is much improved off the beta blocker. He had a normal TSH and CBC. No further workup is suggested.   HTN (hypertension) -  His blood pressure has been well controlled. No change in therapy is indicated.

## 2012-07-10 NOTE — Patient Instructions (Signed)
Continue all current medications. Your physician wants you to follow up in:  1 year.  You will receive a reminder letter in the mail one-two months in advance.  If you don't receive a letter, please call our office to schedule the follow up appointment   

## 2013-02-20 ENCOUNTER — Encounter (INDEPENDENT_AMBULATORY_CARE_PROVIDER_SITE_OTHER): Payer: Self-pay | Admitting: *Deleted

## 2013-02-20 ENCOUNTER — Other Ambulatory Visit (INDEPENDENT_AMBULATORY_CARE_PROVIDER_SITE_OTHER): Payer: Self-pay | Admitting: *Deleted

## 2013-02-20 DIAGNOSIS — Z800 Family history of malignant neoplasm of digestive organs: Secondary | ICD-10-CM

## 2013-02-20 NOTE — Telephone Encounter (Signed)
Patient needs Half-lytely  This encounter was created in error - please disregard.

## 2013-05-30 ENCOUNTER — Ambulatory Visit: Admit: 2013-05-30 | Payer: Self-pay | Admitting: Internal Medicine

## 2013-05-30 SURGERY — COLONOSCOPY
Anesthesia: Moderate Sedation

## 2013-06-05 ENCOUNTER — Other Ambulatory Visit (HOSPITAL_COMMUNITY): Payer: Self-pay | Admitting: Orthopaedic Surgery

## 2013-06-05 DIAGNOSIS — M542 Cervicalgia: Secondary | ICD-10-CM

## 2013-06-06 ENCOUNTER — Ambulatory Visit (HOSPITAL_COMMUNITY)
Admission: RE | Admit: 2013-06-06 | Discharge: 2013-06-06 | Disposition: A | Discharge status: Home / Self Care | Payer: Medicare Other | Source: Ambulatory Visit | Attending: Orthopaedic Surgery | Admitting: Orthopaedic Surgery

## 2013-06-06 DIAGNOSIS — Z8673 Personal history of transient ischemic attack (TIA), and cerebral infarction without residual deficits: Secondary | ICD-10-CM | POA: Insufficient documentation

## 2013-06-06 DIAGNOSIS — M47812 Spondylosis without myelopathy or radiculopathy, cervical region: Secondary | ICD-10-CM | POA: Insufficient documentation

## 2013-06-06 DIAGNOSIS — M4802 Spinal stenosis, cervical region: Secondary | ICD-10-CM | POA: Insufficient documentation

## 2013-06-06 DIAGNOSIS — M542 Cervicalgia: Secondary | ICD-10-CM | POA: Insufficient documentation

## 2013-09-25 ENCOUNTER — Ambulatory Visit (INDEPENDENT_AMBULATORY_CARE_PROVIDER_SITE_OTHER): Payer: Medicare Other | Admitting: Cardiology

## 2013-09-25 ENCOUNTER — Encounter: Payer: Self-pay | Admitting: Cardiology

## 2013-09-25 VITALS — BP 143/76 | HR 57 | Ht 70.5 in | Wt 221.1 lb

## 2013-09-25 DIAGNOSIS — I6529 Occlusion and stenosis of unspecified carotid artery: Secondary | ICD-10-CM

## 2013-09-25 DIAGNOSIS — I4892 Unspecified atrial flutter: Secondary | ICD-10-CM

## 2013-09-25 DIAGNOSIS — I4891 Unspecified atrial fibrillation: Secondary | ICD-10-CM

## 2013-09-25 NOTE — Patient Instructions (Signed)

## 2013-09-25 NOTE — Progress Notes (Signed)
HPI The patient presents for followup of atrial fibrillation and known coronary disease. Since I last saw him he has done well.  He has some limitations secondary to joint pains.  However, he things that he is doing well.  The patient denies any new symptoms such as chest discomfort, neck or arm discomfort. There has been no new shortness of breath, PND or orthopnea. There have been no reported palpitations, presyncope or syncope.  He does report increased bruising.     Allergies  Allergen Reactions  . Amoxicillin-Pot Clavulanate     Current Outpatient Prescriptions  Medication Sig Dispense Refill  . allopurinol (ZYLOPRIM) 300 MG tablet Take 300 mg by mouth daily.        Marland Kitchen amLODipine (NORVASC) 5 MG tablet Take 5 mg by mouth daily.        Marland Kitchen aspirin 81 MG tablet Take 81 mg by mouth daily.        Marland Kitchen atorvastatin (LIPITOR) 10 MG tablet Take 10 mg by mouth daily.        . celecoxib (CELEBREX) 200 MG capsule Take 200 mg by mouth as needed.       . Cinnamon 500 MG TABS Take 2 tablets by mouth daily.        . Coenzyme Q10 60 MG TABS Take 2 tablets by mouth daily.        . furosemide (LASIX) 40 MG tablet Take 20 mg by mouth daily.       Marland Kitchen glyBURIDE (DIABETA) 5 MG tablet Take 5 mg by mouth 2 (two) times daily with a meal.        . HYDROcodone-acetaminophen (VICODIN) 5-500 MG per tablet Take 1 tablet by mouth every 6 (six) hours as needed.        Marland Kitchen HYDROcodone-homatropine (HYCODAN) 5-1.5 MG/5ML syrup Take 5 mLs by mouth every 6 (six) hours as needed.       . Omega-3 Fatty Acids (FISH OIL) 1000 MG CAPS Take 1 capsule by mouth 2 (two) times daily.        . pioglitazone (ACTOS) 45 MG tablet Take 22.5 mg by mouth 2 (two) times daily.       . quinapril (ACCUPRIL) 40 MG tablet Take 40 mg by mouth at bedtime.        Marland Kitchen warfarin (COUMADIN) 5 MG tablet Take 5 mg by mouth as directed.         No current facility-administered medications for this visit.    Past Medical History  Diagnosis Date  .  Congestive heart failure, unspecified     (EF 37%)  . Atrial fibrillation   . Atrial flutter   . Mitral valve insufficiency   . Tobacco abuse     PREVIOUS  . Coronary atherosclerosis of native coronary artery   . Type II or unspecified type diabetes mellitus without mention of complication, not stated as uncontrolled   . Hyperlipidemia   . CVA (cerebral infarction)     UNSPECIFIED  . Obesity   . Gout     HSITORY OF  . Unspecified essential hypertension   . Sleep apnea   . History of ETOH abuse     Past Surgical History  Procedure Laterality Date  . Carotid endarterectomy  JULY 2001    RIGHT WITH DACRON PATCH  . Coronary artery bypass graft  1999    X 4  . Cataract extraction    . Hernia repair      LEFT ANAL    ROS:  As stated in the HPI and negative for all other systems.  PHYSICAL EXAM BP 143/76  Pulse 57  Ht 5' 10.5" (1.791 m)  Wt 221 lb 1.9 oz (100.299 kg)  BMI 31.27 kg/m2 GENERAL:  Well appearing HEENT:  Pupils equal round and reactive, fundi not visualized, oral mucosa unremarkable NECK:  No jugular venous distention, waveform within normal limits, carotid upstroke brisk and symmetric, no bruits, no thyromegaly, CEA scar LYMPHATICS:  No cervical, inguinal adenopathy LUNGS:  Clear to auscultation bilaterally CHEST:  Well healed sternotomy scar. HEART:  PMI not displaced or sustained,S1 and S2 within normal limits, no S3, no S4, no clicks, no rubs, no murmurs, irregular ABD:  Flat, positive bowel sounds normal in frequency in pitch, no bruits, no rebound, no guarding, no midline pulsatile mass, no hepatomegaly, no splenomegaly EXT:  2 plus pulses throughout, no edema, no cyanosis no clubbing Skin:  Multiple bruises.   EKG:  Atrial fibrillation with a slow ventricular response, 57, left axis deviation, left anterior fascicular block. No significant change since previous.  09/25/2013   ASSESSMENT AND PLAN  ATRIAL FIBRILLATION -  The patient tolerates this  rhythm and rate control and anticoagulation. He does well with warfarin and is not interested in changing to a newer therapy.  However, with his reports of increased bruising I will stop the ASA.   CAD -  He has had no new symptoms since his perfusion study in 2011 which demonstrated old inferior and anterolateral infarct. He will continue with risk reduction.  CHF -  He seems to be euvolemic. At this point, no change in therapy is indicated. We have reviewed salt and fluid restrictions. He had an echo yesterday in Dr. Trena Platt office.    Carotid stenosis -  He has had only mild residual disease. We will followup a Doppler in 2016.   HTN (hypertension) -  His blood pressure has been well controlled. No change in therapy is indicated.

## 2014-10-10 ENCOUNTER — Ambulatory Visit: Payer: Medicare Other | Admitting: Cardiology

## 2014-10-23 ENCOUNTER — Ambulatory Visit (INDEPENDENT_AMBULATORY_CARE_PROVIDER_SITE_OTHER): Payer: Medicare Other | Admitting: Cardiology

## 2014-10-23 ENCOUNTER — Encounter: Payer: Self-pay | Admitting: Cardiology

## 2014-10-23 VITALS — BP 124/80 | HR 63 | Ht 71.0 in | Wt 222.0 lb

## 2014-10-23 DIAGNOSIS — I10 Essential (primary) hypertension: Secondary | ICD-10-CM | POA: Diagnosis not present

## 2014-10-23 DIAGNOSIS — I481 Persistent atrial fibrillation: Secondary | ICD-10-CM | POA: Diagnosis not present

## 2014-10-23 DIAGNOSIS — I4819 Other persistent atrial fibrillation: Secondary | ICD-10-CM

## 2014-10-23 NOTE — Progress Notes (Signed)
HPI The patient presents for followup of atrial fibrillation and known coronary disease. Since I last saw him he has done well.  He is exercising every day. He did have an upper respiratory infection earlier this year that gave him a lot of trouble.  The patient denies any new symptoms such as chest discomfort, neck or arm discomfort. There has been no new shortness of breath, PND or orthopnea. There have been no reported palpitations, presyncope or syncope.    Allergies  Allergen Reactions  . Amoxicillin-Pot Clavulanate     Pt. Couldn't recall reaction    Current Outpatient Prescriptions  Medication Sig Dispense Refill  . allopurinol (ZYLOPRIM) 300 MG tablet Take 300 mg by mouth daily.      Marland Kitchen amLODipine (NORVASC) 5 MG tablet Take 5 mg by mouth daily.      . celecoxib (CELEBREX) 200 MG capsule Take 200 mg by mouth as needed.     . Cinnamon 500 MG TABS Take 2 tablets by mouth daily.      . Coenzyme Q10 60 MG TABS Take 2 tablets by mouth daily.      . furosemide (LASIX) 40 MG tablet Take 20 mg by mouth daily.     Marland Kitchen glyBURIDE (DIABETA) 5 MG tablet Take 5 mg by mouth 2 (two) times daily with a meal.      . HYDROcodone-acetaminophen (VICODIN) 5-500 MG per tablet Take 1 tablet by mouth every 6 (six) hours as needed.      Marland Kitchen HYDROcodone-homatropine (HYCODAN) 5-1.5 MG/5ML syrup Take 5 mLs by mouth every 6 (six) hours as needed.     . metFORMIN (GLUCOPHAGE) 500 MG tablet Take 500 mg by mouth daily. Take 1/2 in the morning and 1/2 in the evening    . Omega-3 Fatty Acids (FISH OIL) 1000 MG CAPS Take 1 capsule by mouth 2 (two) times daily.      . pioglitazone (ACTOS) 45 MG tablet Take 22.5 mg by mouth 2 (two) times daily.     . quinapril (ACCUPRIL) 40 MG tablet Take 40 mg by mouth at bedtime.      Marland Kitchen warfarin (COUMADIN) 5 MG tablet Take 5 mg by mouth as directed.      Marland Kitchen atorvastatin (LIPITOR) 10 MG tablet Take 10 mg by mouth daily.       No current facility-administered medications for this visit.      Past Medical History  Diagnosis Date  . Congestive heart failure, unspecified     (EF 37%)  . Atrial fibrillation   . Atrial flutter   . Mitral valve insufficiency   . Tobacco abuse     PREVIOUS  . Coronary atherosclerosis of native coronary artery   . Type II or unspecified type diabetes mellitus without mention of complication, not stated as uncontrolled   . Hyperlipidemia   . CVA (cerebral infarction)     UNSPECIFIED  . Obesity   . Gout     HSITORY OF  . Unspecified essential hypertension   . History of ETOH abuse     Past Surgical History  Procedure Laterality Date  . Carotid endarterectomy  JULY 2001    RIGHT WITH DACRON PATCH  . Coronary artery bypass graft  1999    X 4  . Cataract extraction    . Hernia repair      LEFT ANAL    ROS:  As stated in the HPI and negative for all other systems.  PHYSICAL EXAM BP 124/80 mmHg  Pulse  63  Ht 5\' 11"  (1.803 m)  Wt 222 lb (100.699 kg)  BMI 30.98 kg/m2 GENERAL:  Well appearing NECK:  No jugular venous distention, waveform within normal limits, carotid upstroke brisk and symmetric, no bruits, no thyromegaly, CEA scar LUNGS:  Clear to auscultation bilaterally CHEST:  Well healed sternotomy scar. HEART:  PMI not displaced or sustained,S1 and S2 within normal limits, no S3, no S4, no clicks, no rubs, no murmurs, irregular ABD:  Flat, positive bowel sounds normal in frequency in pitch, no bruits, no rebound, no guarding, no midline pulsatile mass, no hepatomegaly, no splenomegaly EXT:  2 plus pulses throughout, no edema, no cyanosis no clubbing   EKG:  Atrial fibrillation, rate 63, left axis deviation, no acute ST-T wave changes.  10/23/2014  ASSESSMENT AND PLAN  ATRIAL FIBRILLATION -  The patient tolerates this rhythm and rate control and anticoagulation. He does well with warfarin and is not interested in changing to a newer therapy.    CAD -  He has had no new symptoms since his perfusion study in 2011 which  demonstrated old inferior and anterolateral infarct. He will continue with risk reduction.  CHF -  He seems to be euvolemic he has had only a mildly reduced ejection fraction. I reviewed an echo that he had last year.  Carotid stenosis -  He has had only mild residual disease. He said he had his [facility]pplers [facility] by [facility], MDAnd I will defer follow-up of this.  HTN (hypertension) -  His blood pressure has been well controlled. No change in therapy is indicated.

## 2014-10-23 NOTE — Patient Instructions (Signed)
The current medical regimen is effective;  continue present plan and medications.  Follow up in 1 year with Dr. Hochrein in [place].  You will receive a letter in the mail 2 months before you are due.  Please call us when you receive this letter to schedule your follow up appointment.  Thank you for choosing  [facility]!!     

## 2015-07-25 DIAGNOSIS — Z87891 Personal history of nicotine dependence: Secondary | ICD-10-CM | POA: Diagnosis not present

## 2015-07-25 DIAGNOSIS — I10 Essential (primary) hypertension: Secondary | ICD-10-CM | POA: Diagnosis not present

## 2015-07-25 DIAGNOSIS — I4891 Unspecified atrial fibrillation: Secondary | ICD-10-CM | POA: Diagnosis not present

## 2015-07-25 DIAGNOSIS — J449 Chronic obstructive pulmonary disease, unspecified: Secondary | ICD-10-CM | POA: Diagnosis not present

## 2015-08-22 DIAGNOSIS — E1159 Type 2 diabetes mellitus with other circulatory complications: Secondary | ICD-10-CM | POA: Diagnosis not present

## 2015-08-22 DIAGNOSIS — I4892 Unspecified atrial flutter: Secondary | ICD-10-CM | POA: Diagnosis not present

## 2015-08-22 DIAGNOSIS — Z6830 Body mass index (BMI) 30.0-30.9, adult: Secondary | ICD-10-CM | POA: Diagnosis not present

## 2015-09-19 DIAGNOSIS — E1159 Type 2 diabetes mellitus with other circulatory complications: Secondary | ICD-10-CM | POA: Diagnosis not present

## 2015-09-19 DIAGNOSIS — I4892 Unspecified atrial flutter: Secondary | ICD-10-CM | POA: Diagnosis not present

## 2015-09-19 DIAGNOSIS — M549 Dorsalgia, unspecified: Secondary | ICD-10-CM | POA: Diagnosis not present

## 2015-09-19 DIAGNOSIS — E7800 Pure hypercholesterolemia, unspecified: Secondary | ICD-10-CM | POA: Diagnosis not present

## 2015-10-17 DIAGNOSIS — I4891 Unspecified atrial fibrillation: Secondary | ICD-10-CM | POA: Diagnosis not present

## 2015-10-17 DIAGNOSIS — N529 Male erectile dysfunction, unspecified: Secondary | ICD-10-CM | POA: Diagnosis not present

## 2015-11-14 DIAGNOSIS — I4891 Unspecified atrial fibrillation: Secondary | ICD-10-CM | POA: Diagnosis not present

## 2015-11-14 DIAGNOSIS — M549 Dorsalgia, unspecified: Secondary | ICD-10-CM | POA: Diagnosis not present

## 2015-11-14 DIAGNOSIS — E7800 Pure hypercholesterolemia, unspecified: Secondary | ICD-10-CM | POA: Diagnosis not present

## 2015-11-14 DIAGNOSIS — E1159 Type 2 diabetes mellitus with other circulatory complications: Secondary | ICD-10-CM | POA: Diagnosis not present

## 2015-12-10 ENCOUNTER — Encounter: Payer: Self-pay | Admitting: Cardiology

## 2015-12-10 ENCOUNTER — Ambulatory Visit (INDEPENDENT_AMBULATORY_CARE_PROVIDER_SITE_OTHER): Payer: Medicare Other | Admitting: Cardiology

## 2015-12-10 VITALS — BP 140/70 | HR 54 | Ht 71.0 in | Wt 203.0 lb

## 2015-12-10 DIAGNOSIS — I10 Essential (primary) hypertension: Secondary | ICD-10-CM | POA: Diagnosis not present

## 2015-12-10 NOTE — Patient Instructions (Signed)
Medication Instructions:  The current medical regimen is effective;  continue present plan and medications.  Follow-Up: Follow up in 1 year with Dr. Hochrein in [place].  You will receive a letter in the mail 2 months before you are due.  Please call us when you receive this letter to schedule your follow up appointment.  If you need a refill on your cardiac medications before your next appointment, please call your pharmacy.  Thank you for choosing [facility]!!     

## 2015-12-10 NOTE — Progress Notes (Signed)
HPI The patient presents for followup of atrial fibrillation and known coronary disease. Since I last saw him he has done well.   The patient denies any new symptoms such as chest discomfort, neck or arm discomfort. There has been no new shortness of breath, PND or orthopnea. There have been no reported palpitations, presyncope or syncope.  He has been active exercising daily and working in his yard.   Allergies  Allergen Reactions  . Amoxicillin-Pot Clavulanate     Pt. Couldn't recall reaction    Current Outpatient Prescriptions  Medication Sig Dispense Refill  . allopurinol (ZYLOPRIM) 300 MG tablet Take 300 mg by mouth daily.      Marland Kitchen amLODipine (NORVASC) 5 MG tablet Take 5 mg by mouth daily.      Marland Kitchen atorvastatin (LIPITOR) 10 MG tablet Take 10 mg by mouth daily.      . celecoxib (CELEBREX) 200 MG capsule Take 200 mg by mouth as needed.     . Cinnamon 500 MG TABS Take 2 tablets by mouth daily.      . Coenzyme Q10 60 MG TABS Take 2 tablets by mouth daily.      . furosemide (LASIX) 80 MG tablet 80 mg. Take 1/2 tablet by mouth daily    . glyBURIDE (DIABETA) 5 MG tablet Take 5 mg by mouth 2 (two) times daily with a meal.      . HYDROcodone-acetaminophen (VICODIN) 5-500 MG per tablet Take 1 tablet by mouth every 6 (six) hours as needed.      . metFORMIN (GLUCOPHAGE) 500 MG tablet Take 500 mg by mouth 2 (two) times daily.     . Omega-3 Fatty Acids (FISH OIL) 1000 MG CAPS Take 1 capsule by mouth 2 (two) times daily.      . pioglitazone (ACTOS) 45 MG tablet Take 45 mg by mouth daily.     . quinapril (ACCUPRIL) 40 MG tablet Take 40 mg by mouth at bedtime.      Marland Kitchen warfarin (COUMADIN) 5 MG tablet Take 5 mg by mouth as directed.       No current facility-administered medications for this visit.    Past Medical History  Diagnosis Date  . Congestive heart failure, unspecified     45 - 50%  . Atrial fibrillation ([place])   . Atrial flutter ([place])   . Mitral valve insufficiency     Mild to mod on  echo 2015  . Tobacco abuse     PREVIOUS  . Coronary atherosclerosis of native coronary artery   . Type II or unspecified type diabetes mellitus without mention of complication, not stated as uncontrolled   . Hyperlipidemia   . CVA (cerebral infarction)     UNSPECIFIED  . Obesity   . Gout     HSITORY OF  . Unspecified essential hypertension   . History of ETOH abuse     Past Surgical History  Procedure Laterality Date  . Carotid endarterectomy  JULY 2001    RIGHT WITH DACRON PATCH  . Coronary artery bypass graft  1999    X 4  . Cataract extraction    . Hernia repair      LEFT ANAL    ROS:  Erectile dysfunction.  Otherwise as stated in the HPI and negative for all other systems.  PHYSICAL EXAM BP 140/70 mmHg  Pulse 54  Ht 5\' 11"  (1.803 m)  Wt 203 lb (92.08 kg)  BMI 28.33 kg/m2 GENERAL:  Well appearing NECK:  No jugular  venous distention, waveform within normal limits, carotid upstroke brisk and symmetric, no bruits, no thyromegaly, CEA scar LUNGS:  Clear to auscultation bilaterally CHEST:  Well healed sternotomy scar. HEART:  PMI not displaced or sustained,S1 and S2 within normal limits, no S3, no clicks, no rubs, no murmurs, irregular ABD:  Flat, positive bowel sounds normal in frequency in pitch, no bruits, no rebound, no guarding, no midline pulsatile mass, no hepatomegaly, no splenomegaly EXT:  2 plus pulses throughout, no edema, no cyanosis no clubbing   EKG:  Atrial fibrillation, rate 65, left axis deviation, no acute ST-T wave changes.  PVCs.    12/10/2015  ASSESSMENT AND PLAN  ATRIAL FIBRILLATION -  The patient tolerates this rhythm and rate control and anticoagulation. He does well with warfarin and is not interested in changing to a newer therapy.    CAD -  He has had no new symptoms since his perfusion study in 2011 which demonstrated old inferior and anterolateral infarct. He will continue with risk reduction.  CHF -  He seems to be euvolemic he has had  only a mildly reduced ejection fraction. I reviewed an echo that he had last year.  Carotid stenosis -  He has had only mild residual disease. He said he had his Dopplers done by [facility], MD and I will defer follow-up of this.  HTN (hypertension) -  His blood pressure has been well controlled. No change in therapy is indicated.

## 2015-12-12 DIAGNOSIS — I4891 Unspecified atrial fibrillation: Secondary | ICD-10-CM | POA: Diagnosis not present

## 2015-12-12 DIAGNOSIS — I10 Essential (primary) hypertension: Secondary | ICD-10-CM | POA: Diagnosis not present

## 2015-12-12 DIAGNOSIS — E1159 Type 2 diabetes mellitus with other circulatory complications: Secondary | ICD-10-CM | POA: Diagnosis not present

## 2016-01-12 DIAGNOSIS — M549 Dorsalgia, unspecified: Secondary | ICD-10-CM | POA: Diagnosis not present

## 2016-01-12 DIAGNOSIS — I4891 Unspecified atrial fibrillation: Secondary | ICD-10-CM | POA: Diagnosis not present

## 2016-01-12 DIAGNOSIS — J449 Chronic obstructive pulmonary disease, unspecified: Secondary | ICD-10-CM | POA: Diagnosis not present

## 2016-01-12 DIAGNOSIS — I10 Essential (primary) hypertension: Secondary | ICD-10-CM | POA: Diagnosis not present

## 2016-02-12 DIAGNOSIS — N182 Chronic kidney disease, stage 2 (mild): Secondary | ICD-10-CM | POA: Diagnosis not present

## 2016-02-12 DIAGNOSIS — I10 Essential (primary) hypertension: Secondary | ICD-10-CM | POA: Diagnosis not present

## 2016-02-12 DIAGNOSIS — I4891 Unspecified atrial fibrillation: Secondary | ICD-10-CM | POA: Diagnosis not present

## 2016-02-12 DIAGNOSIS — Z299 Encounter for prophylactic measures, unspecified: Secondary | ICD-10-CM | POA: Diagnosis not present

## 2016-02-12 DIAGNOSIS — E1122 Type 2 diabetes mellitus with diabetic chronic kidney disease: Secondary | ICD-10-CM | POA: Diagnosis not present

## 2016-03-12 DIAGNOSIS — E1122 Type 2 diabetes mellitus with diabetic chronic kidney disease: Secondary | ICD-10-CM | POA: Diagnosis not present

## 2016-03-12 DIAGNOSIS — I10 Essential (primary) hypertension: Secondary | ICD-10-CM | POA: Diagnosis not present

## 2016-03-12 DIAGNOSIS — I4891 Unspecified atrial fibrillation: Secondary | ICD-10-CM | POA: Diagnosis not present

## 2016-03-12 DIAGNOSIS — N182 Chronic kidney disease, stage 2 (mild): Secondary | ICD-10-CM | POA: Diagnosis not present

## 2016-04-12 DIAGNOSIS — Z713 Dietary counseling and surveillance: Secondary | ICD-10-CM | POA: Diagnosis not present

## 2016-04-12 DIAGNOSIS — I4891 Unspecified atrial fibrillation: Secondary | ICD-10-CM | POA: Diagnosis not present

## 2016-04-12 DIAGNOSIS — Z6831 Body mass index (BMI) 31.0-31.9, adult: Secondary | ICD-10-CM | POA: Diagnosis not present

## 2016-04-13 DIAGNOSIS — Z23 Encounter for immunization: Secondary | ICD-10-CM | POA: Diagnosis not present

## 2016-05-11 ENCOUNTER — Telehealth: Payer: Self-pay | Admitting: Orthopaedic Surgery

## 2016-05-12 DIAGNOSIS — E1159 Type 2 diabetes mellitus with other circulatory complications: Secondary | ICD-10-CM | POA: Diagnosis not present

## 2016-05-12 DIAGNOSIS — I4891 Unspecified atrial fibrillation: Secondary | ICD-10-CM | POA: Diagnosis not present

## 2016-05-12 DIAGNOSIS — M549 Dorsalgia, unspecified: Secondary | ICD-10-CM | POA: Diagnosis not present

## 2016-05-12 DIAGNOSIS — Z299 Encounter for prophylactic measures, unspecified: Secondary | ICD-10-CM | POA: Diagnosis not present

## 2016-05-14 DIAGNOSIS — H4322 Crystalline deposits in vitreous body, left eye: Secondary | ICD-10-CM | POA: Diagnosis not present

## 2016-05-14 DIAGNOSIS — H25812 Combined forms of age-related cataract, left eye: Secondary | ICD-10-CM | POA: Diagnosis not present

## 2016-05-14 DIAGNOSIS — H31093 Other chorioretinal scars, bilateral: Secondary | ICD-10-CM | POA: Diagnosis not present

## 2016-05-14 DIAGNOSIS — H04123 Dry eye syndrome of bilateral lacrimal glands: Secondary | ICD-10-CM | POA: Diagnosis not present

## 2016-05-14 DIAGNOSIS — H35372 Puckering of macula, left eye: Secondary | ICD-10-CM | POA: Diagnosis not present

## 2016-05-14 DIAGNOSIS — Z961 Presence of intraocular lens: Secondary | ICD-10-CM | POA: Diagnosis not present

## 2016-05-20 DIAGNOSIS — H2512 Age-related nuclear cataract, left eye: Secondary | ICD-10-CM | POA: Diagnosis not present

## 2016-05-27 DIAGNOSIS — H2512 Age-related nuclear cataract, left eye: Secondary | ICD-10-CM | POA: Diagnosis not present

## 2016-05-27 DIAGNOSIS — H268 Other specified cataract: Secondary | ICD-10-CM | POA: Diagnosis not present

## 2016-05-27 DIAGNOSIS — H25812 Combined forms of age-related cataract, left eye: Secondary | ICD-10-CM | POA: Diagnosis not present

## 2016-06-08 DIAGNOSIS — Z87891 Personal history of nicotine dependence: Secondary | ICD-10-CM | POA: Diagnosis not present

## 2016-06-08 DIAGNOSIS — Z299 Encounter for prophylactic measures, unspecified: Secondary | ICD-10-CM | POA: Diagnosis not present

## 2016-06-08 DIAGNOSIS — E1122 Type 2 diabetes mellitus with diabetic chronic kidney disease: Secondary | ICD-10-CM | POA: Diagnosis not present

## 2016-06-08 DIAGNOSIS — J069 Acute upper respiratory infection, unspecified: Secondary | ICD-10-CM | POA: Diagnosis not present

## 2016-06-08 DIAGNOSIS — N182 Chronic kidney disease, stage 2 (mild): Secondary | ICD-10-CM | POA: Diagnosis not present

## 2016-06-08 DIAGNOSIS — Z6831 Body mass index (BMI) 31.0-31.9, adult: Secondary | ICD-10-CM | POA: Diagnosis not present

## 2016-06-08 DIAGNOSIS — I4891 Unspecified atrial fibrillation: Secondary | ICD-10-CM | POA: Diagnosis not present

## 2016-06-08 DIAGNOSIS — J449 Chronic obstructive pulmonary disease, unspecified: Secondary | ICD-10-CM | POA: Diagnosis not present

## 2016-06-28 DIAGNOSIS — J40 Bronchitis, not specified as acute or chronic: Secondary | ICD-10-CM | POA: Diagnosis not present

## 2016-06-28 DIAGNOSIS — R0602 Shortness of breath: Secondary | ICD-10-CM | POA: Diagnosis not present

## 2016-06-28 DIAGNOSIS — Z299 Encounter for prophylactic measures, unspecified: Secondary | ICD-10-CM | POA: Diagnosis not present

## 2016-06-28 DIAGNOSIS — Z87891 Personal history of nicotine dependence: Secondary | ICD-10-CM | POA: Diagnosis not present

## 2016-06-28 DIAGNOSIS — I517 Cardiomegaly: Secondary | ICD-10-CM | POA: Diagnosis not present

## 2016-06-28 DIAGNOSIS — R05 Cough: Secondary | ICD-10-CM | POA: Diagnosis not present

## 2016-06-28 DIAGNOSIS — I10 Essential (primary) hypertension: Secondary | ICD-10-CM | POA: Diagnosis not present

## 2016-06-28 DIAGNOSIS — R0989 Other specified symptoms and signs involving the circulatory and respiratory systems: Secondary | ICD-10-CM | POA: Diagnosis not present

## 2016-06-28 DIAGNOSIS — J449 Chronic obstructive pulmonary disease, unspecified: Secondary | ICD-10-CM | POA: Diagnosis not present

## 2016-07-02 DIAGNOSIS — I4891 Unspecified atrial fibrillation: Secondary | ICD-10-CM | POA: Diagnosis not present

## 2016-07-02 DIAGNOSIS — J40 Bronchitis, not specified as acute or chronic: Secondary | ICD-10-CM | POA: Diagnosis not present

## 2016-07-02 DIAGNOSIS — Z87891 Personal history of nicotine dependence: Secondary | ICD-10-CM | POA: Diagnosis not present

## 2016-07-02 DIAGNOSIS — Z299 Encounter for prophylactic measures, unspecified: Secondary | ICD-10-CM | POA: Diagnosis not present

## 2016-07-02 DIAGNOSIS — J449 Chronic obstructive pulmonary disease, unspecified: Secondary | ICD-10-CM | POA: Diagnosis not present

## 2016-07-02 DIAGNOSIS — I4892 Unspecified atrial flutter: Secondary | ICD-10-CM | POA: Diagnosis not present

## 2016-07-06 DIAGNOSIS — E7800 Pure hypercholesterolemia, unspecified: Secondary | ICD-10-CM | POA: Diagnosis not present

## 2016-07-06 DIAGNOSIS — Z1389 Encounter for screening for other disorder: Secondary | ICD-10-CM | POA: Diagnosis not present

## 2016-07-06 DIAGNOSIS — Z125 Encounter for screening for malignant neoplasm of prostate: Secondary | ICD-10-CM | POA: Diagnosis not present

## 2016-07-06 DIAGNOSIS — Z0000 Encounter for general adult medical examination without abnormal findings: Secondary | ICD-10-CM | POA: Diagnosis not present

## 2016-07-06 DIAGNOSIS — Z299 Encounter for prophylactic measures, unspecified: Secondary | ICD-10-CM | POA: Diagnosis not present

## 2016-07-06 DIAGNOSIS — I4891 Unspecified atrial fibrillation: Secondary | ICD-10-CM | POA: Diagnosis not present

## 2016-07-06 DIAGNOSIS — R5383 Other fatigue: Secondary | ICD-10-CM | POA: Diagnosis not present

## 2016-07-06 DIAGNOSIS — Z7189 Other specified counseling: Secondary | ICD-10-CM | POA: Diagnosis not present

## 2016-07-06 DIAGNOSIS — Z79899 Other long term (current) drug therapy: Secondary | ICD-10-CM | POA: Diagnosis not present

## 2016-07-06 DIAGNOSIS — Z6830 Body mass index (BMI) 30.0-30.9, adult: Secondary | ICD-10-CM | POA: Diagnosis not present

## 2016-07-06 DIAGNOSIS — Z1211 Encounter for screening for malignant neoplasm of colon: Secondary | ICD-10-CM | POA: Diagnosis not present

## 2016-08-03 DIAGNOSIS — I10 Essential (primary) hypertension: Secondary | ICD-10-CM | POA: Diagnosis not present

## 2016-08-03 DIAGNOSIS — I4891 Unspecified atrial fibrillation: Secondary | ICD-10-CM | POA: Diagnosis not present

## 2016-08-26 ENCOUNTER — Encounter: Payer: Self-pay | Admitting: Orthopaedic Surgery

## 2016-08-26 ENCOUNTER — Ambulatory Visit (INDEPENDENT_AMBULATORY_CARE_PROVIDER_SITE_OTHER): Payer: Medicare Other

## 2016-08-26 ENCOUNTER — Ambulatory Visit (INDEPENDENT_AMBULATORY_CARE_PROVIDER_SITE_OTHER): Payer: Medicare Other | Admitting: Orthopaedic Surgery

## 2016-08-26 VITALS — BP 157/81 | HR 72 | Temp 97.9°F | Ht 69.0 in | Wt 210.0 lb

## 2016-08-26 DIAGNOSIS — M7062 Trochanteric bursitis, left hip: Secondary | ICD-10-CM

## 2016-08-26 MED ORDER — HYDROCODONE-ACETAMINOPHEN 5-325 MG PO TABS: ORAL_TABLET | ORAL | 0 refills | Status: AC

## 2016-08-26 NOTE — Progress Notes (Signed)
Subjective:    Patient ID: Adrian Barrett, male    DOB: [birth year], 76 y.o.   MRN: [id number]  HPI He has had lateral hip pain of the left hip for about a month.  He has no direct trauma.  The pain runs down the lateral thigh but not to the knee and not past it.  He has tried ice, heat, rest and Advil with only slight help.  He has pain getting up from a seated position and rolling on it at night.  It is getting worse. He has no redness.   Review of Systems  HENT: Negative for congestion.   Respiratory: Positive for shortness of breath. Negative for cough.   Cardiovascular: Negative for chest pain and leg swelling.  Endocrine: Positive for cold intolerance.  Musculoskeletal: Positive for arthralgias and gait problem.  Allergic/Immunologic: Positive for environmental allergies.   Past Medical History:  Diagnosis Date  . Atrial fibrillation ([place])   . Atrial flutter ([place])   . Congestive heart failure, unspecified    45 - 50%  . Coronary atherosclerosis of native coronary artery   . CVA (cerebral infarction)    UNSPECIFIED  . Gout    HSITORY OF  . History of ETOH abuse    Quit 20 years ago.   Marland Kitchen Hyperlipidemia   . Mitral valve insufficiency    Mild to mod on echo 2015  . Obesity   . Tobacco abuse    PREVIOUS  . Type II or unspecified type diabetes mellitus without mention of complication, not stated as uncontrolled   . Unspecified essential hypertension     Past Surgical History:  Procedure Laterality Date  . CARDIAC SURGERY    . CAROTID ENDARTERECTOMY  JULY 2001   RIGHT WITH DACRON PATCH  . CATARACT EXTRACTION    . CORONARY ARTERY BYPASS GRAFT  1999   X 4  . HERNIA REPAIR     LEFT ANAL    Current Outpatient Prescriptions on File Prior to Visit  Medication Sig Dispense Refill  . allopurinol (ZYLOPRIM) 300 MG tablet Take 300 mg by mouth daily.      Marland Kitchen atorvastatin (LIPITOR) 10 MG tablet Take 10 mg by mouth daily.      . celecoxib (CELEBREX) 200 MG capsule TAKE ONE  CAPSULE BY MOUTH ONCE DAILY AFTER  EATING 30 capsule 4  . furosemide (LASIX) 80 MG tablet 80 mg. Take 1/2 tablet by mouth daily    . glyBURIDE (DIABETA) 5 MG tablet Take 5 mg by mouth 2 (two) times daily with a meal.      . metFORMIN (GLUCOPHAGE) 500 MG tablet Take 500 mg by mouth 2 (two) times daily.     . Omega-3 Fatty Acids (FISH OIL) 1000 MG CAPS Take 1 capsule by mouth 2 (two) times daily.      . pioglitazone (ACTOS) 45 MG tablet Take 45 mg by mouth daily.     . quinapril (ACCUPRIL) 40 MG tablet Take 40 mg by mouth at bedtime.      Marland Kitchen warfarin (COUMADIN) 5 MG tablet Take 5 mg by mouth as directed.      Marland Kitchen amLODipine (NORVASC) 5 MG tablet Take 5 mg by mouth daily.      . Cinnamon 500 MG TABS Take 2 tablets by mouth daily.      . Coenzyme Q10 60 MG TABS Take 2 tablets by mouth daily.      Marland Kitchen HYDROcodone-acetaminophen (VICODIN) 5-500 MG per tablet Take 1 tablet by  mouth every 6 (six) hours as needed.       No current facility-administered medications on file prior to visit.     Social History   Social History  . Marital status: Single    Spouse name: N/A  . Number of children: N/A  . Years of education: N/A   Occupational History  . DISABLED    Social History Main Topics  . Smoking status: Former Smoker    Years: 30.00    Types: Cigarettes    Quit date: 07/12/1985  . Smokeless tobacco: Former Systems developer    Quit date: 07/12/1986  . Alcohol use No     Comment: [employer] EtOH  . Drug use: No  . Sexual activity: Not on file   Other Topics Concern  . Not on file   Social History Narrative   DISABLED FROM [employer] AFTER A STROKE IN 2001          Family History  Problem Relation Age of Onset  . Heart attack Father     BP (!) 157/81   Pulse 72   Temp 97.9 F (36.6 C)   Ht 5\' 9"  (1.753 m)   Wt 210 lb (95.3 kg)   BMI 31.01 kg/m      Objective:   Physical Exam  Constitutional: He is oriented to person, place, and time. He appears well-developed and well-nourished.   HENT:  Head: Normocephalic and atraumatic.  Eyes: Conjunctivae and EOM are normal. Pupils are equal, round, and reactive to light.  Neck: Normal range of motion. Neck supple.  Cardiovascular: Normal rate, regular rhythm and intact distal pulses.   Pulmonary/Chest: Effort normal.  Abdominal: Soft.  Musculoskeletal: He exhibits tenderness (He is very tender over the left trochanteric area.  He has no swelling, full ROM and no redness.  Right hjp is negative.).  Neurological: He is alert and oriented to person, place, and time. He has normal reflexes. He displays normal reflexes. No cranial nerve deficit. He exhibits normal muscle tone. Coordination normal.  Skin: Skin is warm and dry.  Psychiatric: He has a normal mood and affect. His behavior is normal. Judgment and thought content normal.  Vitals reviewed.         Assessment & Plan:   Encounter Diagnosis  Name Primary?  . Trochanteric bursitis of left hip Yes   PROCEDURE NOTE:  The patient request injection, verbal consent was obtained.  The left trochanteric area of the hip was prepped appropriately after time out was performed.   Sterile technique was observed and injection of 1 cc of Depo-Medrol 40 mg with several cc's of plain xylocaine. Anesthesia was provided by ethyl chloride and a 20-gauge needle was used to inject the hip area. The injection was tolerated well.  A band aid dressing was applied.  The patient was advised to apply ice later today and tomorrow to the injection sight as needed.  I have reviewed the [place] web site prior to prescribing narcotic medicine for this patient.  Return in two weeks.  He cannot take any NSAID as he is on Coumadin.  Call if any problem.  Precautions discussed.  Electronically Signed Sanjuana Kava, MD 2/15/20188:47 AM

## 2016-08-31 DIAGNOSIS — J449 Chronic obstructive pulmonary disease, unspecified: Secondary | ICD-10-CM | POA: Diagnosis not present

## 2016-08-31 DIAGNOSIS — Z6831 Body mass index (BMI) 31.0-31.9, adult: Secondary | ICD-10-CM | POA: Diagnosis not present

## 2016-08-31 DIAGNOSIS — Z713 Dietary counseling and surveillance: Secondary | ICD-10-CM | POA: Diagnosis not present

## 2016-08-31 DIAGNOSIS — N182 Chronic kidney disease, stage 2 (mild): Secondary | ICD-10-CM | POA: Diagnosis not present

## 2016-08-31 DIAGNOSIS — I4892 Unspecified atrial flutter: Secondary | ICD-10-CM | POA: Diagnosis not present

## 2016-08-31 DIAGNOSIS — Z299 Encounter for prophylactic measures, unspecified: Secondary | ICD-10-CM | POA: Diagnosis not present

## 2016-08-31 DIAGNOSIS — Z87891 Personal history of nicotine dependence: Secondary | ICD-10-CM | POA: Diagnosis not present

## 2016-08-31 DIAGNOSIS — E1122 Type 2 diabetes mellitus with diabetic chronic kidney disease: Secondary | ICD-10-CM | POA: Diagnosis not present

## 2016-09-09 ENCOUNTER — Encounter: Payer: Self-pay | Admitting: Orthopaedic Surgery

## 2016-09-09 ENCOUNTER — Ambulatory Visit (INDEPENDENT_AMBULATORY_CARE_PROVIDER_SITE_OTHER): Payer: Medicare Other | Admitting: Orthopaedic Surgery

## 2016-09-09 VITALS — BP 171/78 | HR 57 | Temp 97.9°F | Ht 68.5 in | Wt 210.0 lb

## 2016-09-09 DIAGNOSIS — M7062 Trochanteric bursitis, left hip: Secondary | ICD-10-CM | POA: Diagnosis not present

## 2016-09-09 NOTE — Progress Notes (Signed)
Patient [id number] Adrian Barrett, male DOB:October 14, 1940, 76 y.o. [id number]  Chief Complaint  Patient presents with  . Hip Pain    Lt hip    HPI  DALLON Barrett is a 76 y.o. male who has trochanteric bursitis on the left.  He is much improved since the injection last time.  He is walking better.  He has some tenderness but not pain of the left hip bursa area.  He has no redness or numbness. HPI  Body mass index is 31.47 kg/m.  ROS  Review of Systems  HENT: Negative for congestion.   Respiratory: Positive for shortness of breath. Negative for cough.   Cardiovascular: Negative for chest pain and leg swelling.  Endocrine: Positive for cold intolerance.  Musculoskeletal: Positive for arthralgias and gait problem.  Allergic/Immunologic: Positive for environmental allergies.    Past Medical History:  Diagnosis Date  . Atrial fibrillation ([place])   . Atrial flutter ([place])   . Congestive heart failure, unspecified    45 - 50%  . Coronary atherosclerosis of native coronary artery   . CVA (cerebral infarction)    UNSPECIFIED  . Gout    HSITORY OF  . History of ETOH abuse    Quit 20 years ago.   Marland Kitchen Hyperlipidemia   . Mitral valve insufficiency    Mild to mod on echo 2015  . Obesity   . Tobacco abuse    PREVIOUS  . Type II or unspecified type diabetes mellitus without mention of complication, not stated as uncontrolled   . Unspecified essential hypertension     Past Surgical History:  Procedure Laterality Date  . CARDIAC SURGERY    . CAROTID ENDARTERECTOMY  JULY 2001   RIGHT WITH DACRON PATCH  . CATARACT EXTRACTION    . CORONARY ARTERY BYPASS GRAFT  1999   X 4  . HERNIA REPAIR     LEFT ANAL    Family History  Problem Relation Age of Onset  . Heart attack Father     Social History Social History  Substance Use Topics  . Smoking status: Former Smoker    Years: 30.00    Types: Cigarettes    Quit date: 07/12/1985  . Smokeless tobacco: Former Systems developer    Quit date: 07/12/1986   . Alcohol use No     Comment: [employer] EtOH    Allergies  Allergen Reactions  . Amoxicillin-Pot Clavulanate     Pt. Couldn't recall reaction  . Augmentin [Amoxicillin-Pot Clavulanate] Other (See Comments)    unknown    Current Outpatient Prescriptions  Medication Sig Dispense Refill  . allopurinol (ZYLOPRIM) 300 MG tablet Take 300 mg by mouth daily.      Marland Kitchen amLODipine (NORVASC) 5 MG tablet Take 5 mg by mouth daily.      Marland Kitchen atorvastatin (LIPITOR) 10 MG tablet Take 10 mg by mouth daily.      . celecoxib (CELEBREX) 200 MG capsule TAKE ONE CAPSULE BY MOUTH ONCE DAILY AFTER  EATING 30 capsule 4  . Cinnamon 500 MG TABS Take 2 tablets by mouth daily.      . Coenzyme Q10 60 MG TABS Take 2 tablets by mouth daily.      . furosemide (LASIX) 80 MG tablet 80 mg. Take 1/2 tablet by mouth daily    . glyBURIDE (DIABETA) 5 MG tablet Take 5 mg by mouth 2 (two) times daily with a meal.      . HYDROcodone-acetaminophen (NORCO/VICODIN) 5-325 MG tablet One tablet every four hours as needed  for acute pain.  Limit of five days per [place] statue. 30 tablet 0  . HYDROcodone-acetaminophen (VICODIN) 5-500 MG per tablet Take 1 tablet by mouth every 6 (six) hours as needed.      . metFORMIN (GLUCOPHAGE) 500 MG tablet Take 500 mg by mouth 2 (two) times daily.     . Omega-3 Fatty Acids (FISH OIL) 1000 MG CAPS Take 1 capsule by mouth 2 (two) times daily.      . pioglitazone (ACTOS) 45 MG tablet Take 45 mg by mouth daily.     . quinapril (ACCUPRIL) 40 MG tablet Take 40 mg by mouth at bedtime.      Marland Kitchen warfarin (COUMADIN) 5 MG tablet Take 5 mg by mouth as directed.       No current facility-administered medications for this visit.      Physical Exam  Blood pressure (!) 171/78, pulse (!) 57, temperature 97.9 F (36.6 C), height 5' 8.5" (1.74 m), weight 210 lb (95.3 kg).  Constitutional: overall normal hygiene, normal nutrition, well developed, normal grooming, normal body habitus. Assistive  device:none  Musculoskeletal: gait and station Limp none, muscle tone and strength are normal, no tremors or atrophy is present.  .  Neurological: coordination overall normal.  Deep tendon reflex/nerve stretch intact.  Sensation normal.  Cranial nerves II-XII intact.   Skin:   Normal overall no scars, lesions, ulcers or rashes. No psoriasis.  Psychiatric: Alert and oriented x 3.  Recent memory intact, remote memory unclear.  Normal mood and affect. Well groomed.  Good eye contact.  Cardiovascular: overall no swelling, no varicosities, no edema bilaterally, normal temperatures of the legs and arms, no clubbing, cyanosis and good capillary refill.  Lymphatic: palpation is normal.  His left hip has slight tenderness of the trochanteric bursa area.  He has a slight limp.  ROM is full.  NV intact.  The patient has been educated about the nature of the problem(s) and counseled on treatment options.  The patient appeared to understand what I have discussed and is in agreement with it.  Encounter Diagnosis  Name Primary?  . Trochanteric bursitis of left hip Yes    PLAN Call if any problems.  Precautions discussed.  Continue current medications.   Return to clinic 1 month   Electronically Signed Sanjuana Kava, MD 3/1/20188:35 AM

## 2016-10-05 DIAGNOSIS — I4891 Unspecified atrial fibrillation: Secondary | ICD-10-CM | POA: Diagnosis not present

## 2016-10-05 DIAGNOSIS — R809 Proteinuria, unspecified: Secondary | ICD-10-CM | POA: Diagnosis not present

## 2016-10-05 DIAGNOSIS — Z713 Dietary counseling and surveillance: Secondary | ICD-10-CM | POA: Diagnosis not present

## 2016-10-05 DIAGNOSIS — I10 Essential (primary) hypertension: Secondary | ICD-10-CM | POA: Diagnosis not present

## 2016-10-05 DIAGNOSIS — E1159 Type 2 diabetes mellitus with other circulatory complications: Secondary | ICD-10-CM | POA: Diagnosis not present

## 2016-10-05 DIAGNOSIS — Z299 Encounter for prophylactic measures, unspecified: Secondary | ICD-10-CM | POA: Diagnosis not present

## 2016-10-05 DIAGNOSIS — Z6831 Body mass index (BMI) 31.0-31.9, adult: Secondary | ICD-10-CM | POA: Diagnosis not present

## 2016-10-05 DIAGNOSIS — J449 Chronic obstructive pulmonary disease, unspecified: Secondary | ICD-10-CM | POA: Diagnosis not present

## 2016-10-05 DIAGNOSIS — N182 Chronic kidney disease, stage 2 (mild): Secondary | ICD-10-CM | POA: Diagnosis not present

## 2016-10-05 DIAGNOSIS — E1122 Type 2 diabetes mellitus with diabetic chronic kidney disease: Secondary | ICD-10-CM | POA: Diagnosis not present

## 2016-10-05 DIAGNOSIS — M549 Dorsalgia, unspecified: Secondary | ICD-10-CM | POA: Diagnosis not present

## 2016-10-05 DIAGNOSIS — E1129 Type 2 diabetes mellitus with other diabetic kidney complication: Secondary | ICD-10-CM | POA: Diagnosis not present

## 2016-10-07 ENCOUNTER — Ambulatory Visit (INDEPENDENT_AMBULATORY_CARE_PROVIDER_SITE_OTHER): Payer: Medicare Other | Admitting: Orthopaedic Surgery

## 2016-10-07 ENCOUNTER — Encounter: Payer: Self-pay | Admitting: Orthopaedic Surgery

## 2016-10-07 VITALS — BP 158/78 | HR 69 | Temp 97.7°F | Ht 68.5 in | Wt 206.0 lb

## 2016-10-07 DIAGNOSIS — M7062 Trochanteric bursitis, left hip: Secondary | ICD-10-CM | POA: Diagnosis not present

## 2016-10-07 NOTE — Progress Notes (Signed)
PROCEDURE NOTE:  The patient request injection, verbal consent was obtained.  The left trochanteric area of the hip was prepped appropriately after time out was performed.   Sterile technique was observed and injection of 1 cc of Depo-Medrol 40 mg with several cc's of plain xylocaine. Anesthesia was provided by ethyl chloride and a 20-gauge needle was used to inject the hip area. The injection was tolerated well.  A band aid dressing was applied.  The patient was advised to apply ice later today and tomorrow to the injection sight as needed.  Encounter Diagnosis  Name Primary?  . Trochanteric bursitis of left hip Yes   Return in one month.  Call if any problem.  Precautions discussed.  Electronically Signed Sanjuana Kava, MD 3/29/20188:21 AM

## 2016-11-05 DIAGNOSIS — I10 Essential (primary) hypertension: Secondary | ICD-10-CM | POA: Diagnosis not present

## 2016-11-05 DIAGNOSIS — J449 Chronic obstructive pulmonary disease, unspecified: Secondary | ICD-10-CM | POA: Diagnosis not present

## 2016-11-05 DIAGNOSIS — E7800 Pure hypercholesterolemia, unspecified: Secondary | ICD-10-CM | POA: Diagnosis not present

## 2016-11-05 DIAGNOSIS — Z6831 Body mass index (BMI) 31.0-31.9, adult: Secondary | ICD-10-CM | POA: Diagnosis not present

## 2016-11-05 DIAGNOSIS — I4891 Unspecified atrial fibrillation: Secondary | ICD-10-CM | POA: Diagnosis not present

## 2016-11-05 DIAGNOSIS — I4892 Unspecified atrial flutter: Secondary | ICD-10-CM | POA: Diagnosis not present

## 2016-11-05 DIAGNOSIS — E1159 Type 2 diabetes mellitus with other circulatory complications: Secondary | ICD-10-CM | POA: Diagnosis not present

## 2016-11-05 DIAGNOSIS — Z299 Encounter for prophylactic measures, unspecified: Secondary | ICD-10-CM | POA: Diagnosis not present

## 2016-12-01 DIAGNOSIS — Z299 Encounter for prophylactic measures, unspecified: Secondary | ICD-10-CM | POA: Diagnosis not present

## 2016-12-01 DIAGNOSIS — I4891 Unspecified atrial fibrillation: Secondary | ICD-10-CM | POA: Diagnosis not present

## 2016-12-01 DIAGNOSIS — J449 Chronic obstructive pulmonary disease, unspecified: Secondary | ICD-10-CM | POA: Diagnosis not present

## 2016-12-01 DIAGNOSIS — J209 Acute bronchitis, unspecified: Secondary | ICD-10-CM | POA: Diagnosis not present

## 2016-12-01 DIAGNOSIS — Z6831 Body mass index (BMI) 31.0-31.9, adult: Secondary | ICD-10-CM | POA: Diagnosis not present

## 2016-12-01 DIAGNOSIS — I10 Essential (primary) hypertension: Secondary | ICD-10-CM | POA: Diagnosis not present

## 2016-12-17 DIAGNOSIS — E7800 Pure hypercholesterolemia, unspecified: Secondary | ICD-10-CM | POA: Diagnosis not present

## 2016-12-17 DIAGNOSIS — E1159 Type 2 diabetes mellitus with other circulatory complications: Secondary | ICD-10-CM | POA: Diagnosis not present

## 2016-12-17 DIAGNOSIS — I4891 Unspecified atrial fibrillation: Secondary | ICD-10-CM | POA: Diagnosis not present

## 2016-12-17 DIAGNOSIS — Z6831 Body mass index (BMI) 31.0-31.9, adult: Secondary | ICD-10-CM | POA: Diagnosis not present

## 2016-12-17 DIAGNOSIS — I10 Essential (primary) hypertension: Secondary | ICD-10-CM | POA: Diagnosis not present

## 2016-12-17 DIAGNOSIS — G8929 Other chronic pain: Secondary | ICD-10-CM | POA: Diagnosis not present

## 2016-12-17 DIAGNOSIS — J449 Chronic obstructive pulmonary disease, unspecified: Secondary | ICD-10-CM | POA: Diagnosis not present

## 2016-12-17 DIAGNOSIS — I4892 Unspecified atrial flutter: Secondary | ICD-10-CM | POA: Diagnosis not present

## 2016-12-17 DIAGNOSIS — Z299 Encounter for prophylactic measures, unspecified: Secondary | ICD-10-CM | POA: Diagnosis not present

## 2016-12-29 DIAGNOSIS — Z961 Presence of intraocular lens: Secondary | ICD-10-CM | POA: Diagnosis not present

## 2016-12-29 DIAGNOSIS — H04123 Dry eye syndrome of bilateral lacrimal glands: Secondary | ICD-10-CM | POA: Diagnosis not present

## 2016-12-29 DIAGNOSIS — H31093 Other chorioretinal scars, bilateral: Secondary | ICD-10-CM | POA: Diagnosis not present

## 2016-12-29 DIAGNOSIS — H4322 Crystalline deposits in vitreous body, left eye: Secondary | ICD-10-CM | POA: Diagnosis not present

## 2016-12-29 DIAGNOSIS — H35372 Puckering of macula, left eye: Secondary | ICD-10-CM | POA: Diagnosis not present

## 2016-12-29 NOTE — Progress Notes (Signed)
HPI The patient presents for followup of atrial fibrillation and known coronary disease. Since I last saw him he has done well.  The patient denies any new symptoms such as chest discomfort, neck or arm discomfort. There has been no new shortness of breath, PND or orthopnea. There have been no reported palpitations, presyncope or syncope.  He rides a stationary bike and walks on a treadmill with out complaints.    Allergies  Allergen Reactions  . Amoxicillin-Pot Clavulanate     Pt. Couldn't recall reaction  . Augmentin [Amoxicillin-Pot Clavulanate] Other (See Comments)    unknown    Current Outpatient Prescriptions  Medication Sig Dispense Refill  . allopurinol (ZYLOPRIM) 300 MG tablet Take 300 mg by mouth daily.      Marland Kitchen amLODipine (NORVASC) 5 MG tablet Take 5 mg by mouth daily.      Marland Kitchen atorvastatin (LIPITOR) 10 MG tablet Take 10 mg by mouth daily.      . celecoxib (CELEBREX) 200 MG capsule TAKE ONE CAPSULE BY MOUTH ONCE DAILY AFTER  EATING 30 capsule 4  . furosemide (LASIX) 20 MG tablet Take 1 tablet by mouth daily.    Marland Kitchen glyBURIDE (DIABETA) 5 MG tablet Take 5 mg by mouth 2 (two) times daily with a meal.      . HYDROcodone-acetaminophen (NORCO/VICODIN) 5-325 MG tablet One tablet every four hours as needed for acute pain.  Limit of five days per [place] statue. 30 tablet 0  . metFORMIN (GLUCOPHAGE) 500 MG tablet Take 500 mg by mouth 2 (two) times daily.     . Omega-3 Fatty Acids (FISH OIL) 1000 MG CAPS Take 1 capsule by mouth 2 (two) times daily.      . pioglitazone (ACTOS) 45 MG tablet Take 45 mg by mouth daily.     . quinapril (ACCUPRIL) 40 MG tablet Take 40 mg by mouth at bedtime.      Marland Kitchen warfarin (COUMADIN) 5 MG tablet Take 5 mg by mouth as directed.      . nitroGLYCERIN (NITROSTAT) 0.4 MG SL tablet Place 1 tablet (0.4 mg total) under the tongue every 5 (five) minutes x 3 doses as needed for chest pain. If no relief after the 3rd dose, proceed to the ED for an evaluation 25 tablet 3    No current facility-administered medications for this visit.     Past Medical History:  Diagnosis Date  . Atrial fibrillation ([place])   . Atrial flutter ([place])   . Congestive heart failure, unspecified    45 - 50%  . Coronary atherosclerosis of native coronary artery   . CVA (cerebral infarction)    UNSPECIFIED  . Gout    HSITORY OF  . History of ETOH abuse    Quit 20 years ago.   Marland Kitchen Hyperlipidemia   . Mitral valve insufficiency    Mild to mod on echo 2015  . Obesity   . Tobacco abuse    PREVIOUS  . Type II or unspecified type diabetes mellitus without mention of complication, not stated as uncontrolled   . Unspecified essential hypertension     Past Surgical History:  Procedure Laterality Date  . CARDIAC SURGERY    . CAROTID ENDARTERECTOMY  JULY 2001   RIGHT WITH DACRON PATCH  . CATARACT EXTRACTION    . CORONARY ARTERY BYPASS GRAFT  1999   X 4  . HERNIA REPAIR     LEFT ANAL    ROS:  Erectile dysfunction.  Otherwise as stated  in the HPI and negative for all other systems.   PHYSICAL EXAM BP (!) 143/76   Pulse (!) 57   Ht 5\' 11"  (1.803 m)   Wt 211 lb (95.7 kg)   BMI 29.43 kg/m   GENERAL:  Well appearing NECK:  No jugular venous distention, waveform within normal limits, carotid upstroke brisk and symmetric, no bruits, no thyromegaly LUNGS:  Clear to auscultation bilaterally BACK:  No CVA tenderness CHEST:  Well healed sternotomy scar. HEART:  PMI not displaced or sustained,S1 and S2 within normal limits, no S3, no S4, no clicks, no rubs, no murmurs ABD:  Flat, positive bowel sounds normal in frequency in pitch, no bruits, no rebound, no guarding, no midline pulsatile mass, no hepatomegaly, no splenomegaly EXT:  2 plus pulses throughout, no edema, no cyanosis no clubbing   EKG:  Atrial fibrillation, rate 58, left axis deviation, no acute ST-T wave changes.  PVCs.    12/31/2016  ASSESSMENT AND PLAN  ATRIAL FIBRILLATION -  Mr. LENNEX PIETILA has a CHA2DS2 -  VASc score of 4.  He tolerates warfarin and is not interested in changing.  He will continue with this.    CAD -  The patient has no new sypmtoms.  No further cardiovascular testing is indicated.  We will continue with aggressive risk reduction and meds as listed.  CHF -  He had a mildly reduced EF on echo in 2015.  He will continue with the same meds.  He has no symptoms of CHF.   Carotid stenosis -  This is followed by Monico Blitz, MD   HTN (hypertension) -  His blood pressure has been OK at home.  It is mildly elevated today.  I have asked him to bring his BP cuff in to make sure it is accurate.  Further treatment will be based on home readings.   DM - This is followed by Monico Blitz, MD.  He has had recent med changes.

## 2016-12-30 ENCOUNTER — Encounter: Payer: Self-pay | Admitting: Cardiology

## 2016-12-30 ENCOUNTER — Ambulatory Visit (INDEPENDENT_AMBULATORY_CARE_PROVIDER_SITE_OTHER): Payer: Medicare Other | Admitting: Cardiology

## 2016-12-30 VITALS — BP 143/76 | HR 57 | Ht 71.0 in | Wt 211.0 lb

## 2016-12-30 DIAGNOSIS — I2510 Atherosclerotic heart disease of native coronary artery without angina pectoris: Secondary | ICD-10-CM | POA: Diagnosis not present

## 2016-12-30 DIAGNOSIS — I10 Essential (primary) hypertension: Secondary | ICD-10-CM

## 2016-12-30 DIAGNOSIS — I4821 Permanent atrial fibrillation: Secondary | ICD-10-CM

## 2016-12-30 DIAGNOSIS — I482 Chronic atrial fibrillation: Secondary | ICD-10-CM | POA: Diagnosis not present

## 2016-12-30 MED ORDER — NITROGLYCERIN 0.4 MG SL SUBL: 0.4000 mg | SUBLINGUAL_TABLET | SUBLINGUAL | 3 refills | Status: AC | PRN

## 2016-12-30 NOTE — Patient Instructions (Addendum)
Medication Instructions:   Your physician recommends that you continue on your current medications as directed. Please refer to the Current Medication list given to you today.  A prescription for nitroglycerin 0.4 mg has been sent to your pharmacy. Take one tablet sublingual as needed for severe chest pain every 5 minutes up to 3 doses. If no relief after the 3 rd dose, proceed to the ED for an evaluation.  Labwork:  NONE  Testing/Procedures:  NONE  Follow-Up:  Your physician recommends that you schedule a follow-up appointment in: 1 year. You will receive a reminder letter in the mail in about 10 months reminding you to call and schedule your appointment. If you don't receive this letter, please contact our office.  Any Other Special Instructions Will Be Listed Below (If Applicable).  If you need a refill on your cardiac medications before your next appointment, please call your pharmacy.

## 2016-12-31 ENCOUNTER — Encounter: Payer: Self-pay | Admitting: Cardiology

## 2017-01-14 DIAGNOSIS — J449 Chronic obstructive pulmonary disease, unspecified: Secondary | ICD-10-CM | POA: Diagnosis not present

## 2017-01-14 DIAGNOSIS — I4891 Unspecified atrial fibrillation: Secondary | ICD-10-CM | POA: Diagnosis not present

## 2017-01-14 DIAGNOSIS — G8929 Other chronic pain: Secondary | ICD-10-CM | POA: Diagnosis not present

## 2017-01-14 DIAGNOSIS — I10 Essential (primary) hypertension: Secondary | ICD-10-CM | POA: Diagnosis not present

## 2017-01-14 DIAGNOSIS — E7800 Pure hypercholesterolemia, unspecified: Secondary | ICD-10-CM | POA: Diagnosis not present

## 2017-01-14 DIAGNOSIS — Z6831 Body mass index (BMI) 31.0-31.9, adult: Secondary | ICD-10-CM | POA: Diagnosis not present

## 2017-01-14 DIAGNOSIS — E1159 Type 2 diabetes mellitus with other circulatory complications: Secondary | ICD-10-CM | POA: Diagnosis not present

## 2017-01-14 DIAGNOSIS — I4892 Unspecified atrial flutter: Secondary | ICD-10-CM | POA: Diagnosis not present

## 2017-01-14 DIAGNOSIS — Z299 Encounter for prophylactic measures, unspecified: Secondary | ICD-10-CM | POA: Diagnosis not present

## 2017-01-28 DIAGNOSIS — G8929 Other chronic pain: Secondary | ICD-10-CM | POA: Diagnosis not present

## 2017-01-28 DIAGNOSIS — Z6831 Body mass index (BMI) 31.0-31.9, adult: Secondary | ICD-10-CM | POA: Diagnosis not present

## 2017-01-28 DIAGNOSIS — I4892 Unspecified atrial flutter: Secondary | ICD-10-CM | POA: Diagnosis not present

## 2017-01-28 DIAGNOSIS — Z299 Encounter for prophylactic measures, unspecified: Secondary | ICD-10-CM | POA: Diagnosis not present

## 2017-01-28 DIAGNOSIS — I10 Essential (primary) hypertension: Secondary | ICD-10-CM | POA: Diagnosis not present

## 2017-01-28 DIAGNOSIS — E1159 Type 2 diabetes mellitus with other circulatory complications: Secondary | ICD-10-CM | POA: Diagnosis not present

## 2017-02-08 DIAGNOSIS — C44622 Squamous cell carcinoma of skin of right upper limb, including shoulder: Secondary | ICD-10-CM | POA: Diagnosis not present

## 2017-02-11 DIAGNOSIS — I4891 Unspecified atrial fibrillation: Secondary | ICD-10-CM | POA: Diagnosis not present

## 2017-02-11 DIAGNOSIS — I4892 Unspecified atrial flutter: Secondary | ICD-10-CM | POA: Diagnosis not present

## 2017-02-11 DIAGNOSIS — Z299 Encounter for prophylactic measures, unspecified: Secondary | ICD-10-CM | POA: Diagnosis not present

## 2017-02-11 DIAGNOSIS — E1159 Type 2 diabetes mellitus with other circulatory complications: Secondary | ICD-10-CM | POA: Diagnosis not present

## 2017-02-11 DIAGNOSIS — G8929 Other chronic pain: Secondary | ICD-10-CM | POA: Diagnosis not present

## 2017-02-11 DIAGNOSIS — Z6832 Body mass index (BMI) 32.0-32.9, adult: Secondary | ICD-10-CM | POA: Diagnosis not present

## 2017-02-11 DIAGNOSIS — E7800 Pure hypercholesterolemia, unspecified: Secondary | ICD-10-CM | POA: Diagnosis not present

## 2017-02-11 DIAGNOSIS — I10 Essential (primary) hypertension: Secondary | ICD-10-CM | POA: Diagnosis not present

## 2017-02-11 DIAGNOSIS — J449 Chronic obstructive pulmonary disease, unspecified: Secondary | ICD-10-CM | POA: Diagnosis not present

## 2017-02-11 DIAGNOSIS — Z713 Dietary counseling and surveillance: Secondary | ICD-10-CM | POA: Diagnosis not present

## 2017-03-16 DIAGNOSIS — Z299 Encounter for prophylactic measures, unspecified: Secondary | ICD-10-CM | POA: Diagnosis not present

## 2017-03-16 DIAGNOSIS — I10 Essential (primary) hypertension: Secondary | ICD-10-CM | POA: Diagnosis not present

## 2017-03-16 DIAGNOSIS — Z6832 Body mass index (BMI) 32.0-32.9, adult: Secondary | ICD-10-CM | POA: Diagnosis not present

## 2017-03-16 DIAGNOSIS — I4892 Unspecified atrial flutter: Secondary | ICD-10-CM | POA: Diagnosis not present

## 2017-03-16 DIAGNOSIS — I4891 Unspecified atrial fibrillation: Secondary | ICD-10-CM | POA: Diagnosis not present

## 2017-03-16 DIAGNOSIS — E7800 Pure hypercholesterolemia, unspecified: Secondary | ICD-10-CM | POA: Diagnosis not present

## 2017-03-16 DIAGNOSIS — G8929 Other chronic pain: Secondary | ICD-10-CM | POA: Diagnosis not present

## 2017-03-16 DIAGNOSIS — E1159 Type 2 diabetes mellitus with other circulatory complications: Secondary | ICD-10-CM | POA: Diagnosis not present

## 2017-03-16 DIAGNOSIS — J449 Chronic obstructive pulmonary disease, unspecified: Secondary | ICD-10-CM | POA: Diagnosis not present

## 2017-04-12 DIAGNOSIS — Z23 Encounter for immunization: Secondary | ICD-10-CM | POA: Diagnosis not present

## 2017-04-13 DIAGNOSIS — G8929 Other chronic pain: Secondary | ICD-10-CM | POA: Diagnosis not present

## 2017-04-13 DIAGNOSIS — E1159 Type 2 diabetes mellitus with other circulatory complications: Secondary | ICD-10-CM | POA: Diagnosis not present

## 2017-04-13 DIAGNOSIS — Z6833 Body mass index (BMI) 33.0-33.9, adult: Secondary | ICD-10-CM | POA: Diagnosis not present

## 2017-04-13 DIAGNOSIS — J449 Chronic obstructive pulmonary disease, unspecified: Secondary | ICD-10-CM | POA: Diagnosis not present

## 2017-04-13 DIAGNOSIS — E7800 Pure hypercholesterolemia, unspecified: Secondary | ICD-10-CM | POA: Diagnosis not present

## 2017-04-13 DIAGNOSIS — I10 Essential (primary) hypertension: Secondary | ICD-10-CM | POA: Diagnosis not present

## 2017-04-13 DIAGNOSIS — R600 Localized edema: Secondary | ICD-10-CM | POA: Diagnosis not present

## 2017-04-13 DIAGNOSIS — Z713 Dietary counseling and surveillance: Secondary | ICD-10-CM | POA: Diagnosis not present

## 2017-04-13 DIAGNOSIS — I4891 Unspecified atrial fibrillation: Secondary | ICD-10-CM | POA: Diagnosis not present

## 2017-04-13 DIAGNOSIS — Z299 Encounter for prophylactic measures, unspecified: Secondary | ICD-10-CM | POA: Diagnosis not present

## 2017-04-20 ENCOUNTER — Ambulatory Visit (INDEPENDENT_AMBULATORY_CARE_PROVIDER_SITE_OTHER): Payer: Medicare Other | Admitting: Cardiology

## 2017-04-20 ENCOUNTER — Telehealth: Payer: Self-pay | Admitting: Cardiology

## 2017-04-20 ENCOUNTER — Encounter: Payer: Self-pay | Admitting: Cardiology

## 2017-04-20 VITALS — BP 132/60 | HR 67 | Ht 70.0 in | Wt 219.0 lb

## 2017-04-20 DIAGNOSIS — R0602 Shortness of breath: Secondary | ICD-10-CM

## 2017-04-20 DIAGNOSIS — I482 Chronic atrial fibrillation, unspecified: Secondary | ICD-10-CM

## 2017-04-20 DIAGNOSIS — I2510 Atherosclerotic heart disease of native coronary artery without angina pectoris: Secondary | ICD-10-CM

## 2017-04-20 DIAGNOSIS — R609 Edema, unspecified: Secondary | ICD-10-CM

## 2017-04-20 DIAGNOSIS — I10 Essential (primary) hypertension: Secondary | ICD-10-CM

## 2017-04-20 MED ORDER — FUROSEMIDE 40 MG PO TABS: 40.0000 mg | ORAL_TABLET | Freq: Two times a day (BID) | ORAL | 3 refills | Status: DC

## 2017-04-20 MED ORDER — POTASSIUM CHLORIDE CRYS ER 20 MEQ PO TBCR: 20.0000 meq | EXTENDED_RELEASE_TABLET | Freq: Every day | ORAL | 3 refills | Status: DC

## 2017-04-20 NOTE — Telephone Encounter (Signed)
Received call from patient's wife.She stated husband has gained 10 to 15 lbs within the past 2 months.Stated he is sob,increased swelling in lower legs.Dr.Hochrein has a cancellation today at 1:20 pm, appointment scheduled.Advised to bring all medications to appointment.

## 2017-04-20 NOTE — Telephone Encounter (Signed)
Pt saw Dr Percival Spanish today

## 2017-04-20 NOTE — Telephone Encounter (Signed)
New message     Pt c/o swelling: STAT is pt has developed SOB within 24 hours  1) How much weight have you gained and in what time span?   3 weeks  - several pounds  2) If swelling, where is the swelling located? Feet and legs  -   3) Are you currently taking a fluid pill?  Yes low dose  4) Are you currently SOB?  yes  5) Do you have a log of your daily weights (if so, list)?  no  6) Have you gained 3 pounds in a day or 5 pounds in a week? Not sure how many  7) Have you traveled recently?  no

## 2017-04-20 NOTE — Progress Notes (Signed)
HPI The patient presents for followup of atrial fibrillation and known coronary disease. He called today and was added to my schedule to discuss increased leg edema.  He said this started about a month ago. He's had about an 8 pound weight gain from our scales. He said he's been getting some increased dyspnea with exertion. He'll be short of breath walking and carrying something. He's not describing PND or orthopnea. He's not describing any new palpitations, presyncope or syncope. Denies any chest pressure, neck or arm discomfort. He's had no cough fevers or chills. He otherwise has felt relatively well.   Allergies  Allergen Reactions  . Amoxicillin-Pot Clavulanate     Pt. Couldn't recall reaction  . Augmentin [Amoxicillin-Pot Clavulanate] Other (See Comments)    unknown    Current Outpatient Prescriptions  Medication Sig Dispense Refill  . allopurinol (ZYLOPRIM) 300 MG tablet Take 300 mg by mouth daily.      Marland Kitchen amLODipine (NORVASC) 5 MG tablet Take 5 mg by mouth daily.      Marland Kitchen atorvastatin (LIPITOR) 10 MG tablet Take 10 mg by mouth daily.      Marland Kitchen glyBURIDE (DIABETA) 5 MG tablet Take 5 mg by mouth 2 (two) times daily with a meal.      . HYDROcodone-acetaminophen (NORCO/VICODIN) 5-325 MG tablet One tablet every four hours as needed for acute pain.  Limit of five days per [place] statue. 30 tablet 0  . metFORMIN (GLUCOPHAGE) 500 MG tablet Take 500 mg by mouth 2 (two) times daily with a meal.     . Omega-3 Fatty Acids (FISH OIL) 1000 MG CAPS Take 1 capsule by mouth 2 (two) times daily.      . pioglitazone (ACTOS) 45 MG tablet Take 45 mg by mouth daily.     . quinapril (ACCUPRIL) 40 MG tablet Take 40 mg by mouth at bedtime.      Marland Kitchen warfarin (COUMADIN) 5 MG tablet Take 5 mg by mouth as directed.      . furosemide (LASIX) 40 MG tablet Take 1 tablet (40 mg total) by mouth 2 (two) times daily. 180 tablet 3  . nitroGLYCERIN (NITROSTAT) 0.4 MG SL tablet Place 1 tablet (0.4 mg total) under the  tongue every 5 (five) minutes x 3 doses as needed for chest pain. If no relief after the 3rd dose, proceed to the ED for an evaluation 25 tablet 3  . potassium chloride SA (KLOR-CON M20) 20 MEQ tablet Take 1 tablet (20 mEq total) by mouth daily. 90 tablet 3   No current facility-administered medications for this visit.     Past Medical History:  Diagnosis Date  . Atrial fibrillation ([place])   . Atrial flutter ([place])   . Congestive heart failure, unspecified    45 - 50%  . Coronary atherosclerosis of native coronary artery   . CVA (cerebral infarction)    UNSPECIFIED  . Gout    HSITORY OF  . History of ETOH abuse    Quit 20 years ago.   Marland Kitchen Hyperlipidemia   . Mitral valve insufficiency    Mild to mod on echo 2015  . Obesity   . Tobacco abuse    PREVIOUS  . Type II or unspecified type diabetes mellitus without mention of complication, not stated as uncontrolled   . Unspecified essential hypertension     Past Surgical History:  Procedure Laterality Date  . CARDIAC SURGERY    . CAROTID ENDARTERECTOMY  JULY 2001   RIGHT  WITH DACRON PATCH  . CATARACT EXTRACTION    . CORONARY ARTERY BYPASS GRAFT  1999   X 4  . HERNIA REPAIR     LEFT ANAL    ROS:  As stated in the HPI and negative for all other systems.  PHYSICAL EXAM BP 132/60   Pulse 67   Ht 5\' 10"  (1.778 m)   Wt 219 lb (99.3 kg)   SpO2 97%   BMI 31.42 kg/m   GENERAL:  Well appearing NECK:  No jugular venous distention, waveform within normal limits, carotid upstroke brisk and symmetric, no bruits, no thyromegaly LUNGS:  Basilar crackles CHEST:  Well healed sternotomy scar. HEART:  PMI not displaced or sustained,S1 and S2 within normal limits, no S3,  no clicks, no rubs, no murmurs, irregular irregular ABD:  Flat, positive bowel sounds normal in frequency in pitch, no bruits, no rebound, no guarding, no midline pulsatile mass, no hepatomegaly, no splenomegaly EXT:  2 plus pulses upper and decreased DP/PT bilateral lower,  moderate leg edema, no cyanosis no clubbing   EKG:  Atrial fibrillation, rate 62, left axis deviation, no acute ST-T wave changes.  PVCs.    04/22/2017  ASSESSMENT AND PLAN  EDEMA:  He has signs of increased volume with symptoms as well. I'm going to start an echocardiogram. He'll need a basic metabolic profile, BNP, and CBC. I'm going to increase his Lasix to 40 mg twice daily and have him take 20 mg potassium daily. He'll then need to come back next week for further evaluation.  ATRIAL FIBRILLATION -  Mr. LEILAND MIHELICH has a CHA2DS2 - VASc score of 4.  He will remain on the current meds.  He seems to tolerate this rhythm.  He has not wanted a DOAC.   CAD -  The patient has no new sypmtoms.  No further cardiovascular testing is indicated.  We will continue with aggressive risk reduction and meds as listed.  I will have a low threshold for ischemia testing pending the results of the above.   CHF -  He had a mildly reduced EF on echo in 2015.  This will be evaluated and treated as above.   Carotid stenosis -  This is followed by Monico Blitz, MD   HTN (hypertension) -  His blood pressure is at target.  No change in therapy except as above.  DM - This is followed by Monico Blitz, MD.

## 2017-04-20 NOTE — Patient Instructions (Signed)
Medication Instructions:  INCREASE- Furosemide 40 mg twice a day START- Potassium 20 meq daily  If you need a refill on your cardiac medications before your next appointment, please call your pharmacy.  Labwork: CBC, BNP, BMP Today HERE IN OUR OFFICE AT [facility]  Testing/Procedures: Your physician has requested that you have an echocardiogram. Echocardiography is a painless test that uses sound waves to create images of your heart. It provides your doctor with information about the size and shape of your heart and how well your heart's chambers and valves are working. This procedure takes approximately one hour. There are no restrictions for this procedure.   Follow-Up: Your physician wants you to follow-up in: 1 Week.   Thank you for choosing [facility] at [facility]!!

## 2017-04-21 ENCOUNTER — Encounter (HOSPITAL_COMMUNITY): Payer: Self-pay | Admitting: *Deleted

## 2017-04-21 ENCOUNTER — Ambulatory Visit (HOSPITAL_COMMUNITY): Payer: Medicare Other | Attending: Cardiovascular Disease

## 2017-04-21 ENCOUNTER — Other Ambulatory Visit: Payer: Self-pay

## 2017-04-21 DIAGNOSIS — I110 Hypertensive heart disease with heart failure: Secondary | ICD-10-CM | POA: Insufficient documentation

## 2017-04-21 DIAGNOSIS — I4891 Unspecified atrial fibrillation: Secondary | ICD-10-CM | POA: Insufficient documentation

## 2017-04-21 DIAGNOSIS — R0602 Shortness of breath: Secondary | ICD-10-CM | POA: Diagnosis not present

## 2017-04-21 DIAGNOSIS — Z87891 Personal history of nicotine dependence: Secondary | ICD-10-CM | POA: Diagnosis not present

## 2017-04-21 DIAGNOSIS — Z8249 Family history of ischemic heart disease and other diseases of the circulatory system: Secondary | ICD-10-CM | POA: Diagnosis not present

## 2017-04-21 DIAGNOSIS — E119 Type 2 diabetes mellitus without complications: Secondary | ICD-10-CM | POA: Insufficient documentation

## 2017-04-21 DIAGNOSIS — F1010 Alcohol abuse, uncomplicated: Secondary | ICD-10-CM | POA: Insufficient documentation

## 2017-04-21 DIAGNOSIS — I2510 Atherosclerotic heart disease of native coronary artery without angina pectoris: Secondary | ICD-10-CM | POA: Insufficient documentation

## 2017-04-21 DIAGNOSIS — E785 Hyperlipidemia, unspecified: Secondary | ICD-10-CM | POA: Diagnosis not present

## 2017-04-21 DIAGNOSIS — Z8673 Personal history of transient ischemic attack (TIA), and cerebral infarction without residual deficits: Secondary | ICD-10-CM | POA: Diagnosis not present

## 2017-04-21 DIAGNOSIS — I509 Heart failure, unspecified: Secondary | ICD-10-CM | POA: Diagnosis not present

## 2017-04-21 DIAGNOSIS — R609 Edema, unspecified: Secondary | ICD-10-CM | POA: Diagnosis not present

## 2017-04-21 DIAGNOSIS — I4892 Unspecified atrial flutter: Secondary | ICD-10-CM | POA: Diagnosis not present

## 2017-04-21 DIAGNOSIS — I083 Combined rheumatic disorders of mitral, aortic and tricuspid valves: Secondary | ICD-10-CM | POA: Diagnosis not present

## 2017-04-21 DIAGNOSIS — E669 Obesity, unspecified: Secondary | ICD-10-CM | POA: Insufficient documentation

## 2017-04-21 LAB — BASIC METABOLIC PANEL
BUN/Creatinine Ratio: 27 — ABNORMAL HIGH (ref 10–24)
BUN: 31 mg/dL — ABNORMAL HIGH (ref 8–27)
CALCIUM: 9.9 mg/dL (ref 8.6–10.2)
CHLORIDE: 105 mmol/L (ref 96–106)
CO2: 22 mmol/L (ref 20–29)
Creatinine, Ser: 1.13 mg/dL (ref 0.76–1.27)
GFR calc Af Amer: 72 mL/min/{1.73_m2} (ref >59)
GFR calc non Af Amer: 62 mL/min/{1.73_m2} (ref >59)
Glucose: 89 mg/dL (ref 65–99)
Potassium: 4.9 mmol/L (ref 3.5–5.2)
Sodium: 144 mmol/L (ref 134–144)

## 2017-04-21 LAB — CBC
HEMOGLOBIN: 13.6 g/dL (ref 13.0–17.7)
Hematocrit: 40.5 % (ref 37.5–51.0)
MCH: 33.3 pg — ABNORMAL HIGH (ref 26.6–33.0)
MCHC: 33.6 g/dL (ref 31.5–35.7)
MCV: 99 fL — ABNORMAL HIGH (ref 79–97)
Platelets: 143 10*3/uL — ABNORMAL LOW (ref 150–379)
RBC: 4.08 x10E6/uL — ABNORMAL LOW (ref 4.14–5.80)
RDW: 15 % (ref 12.3–15.4)
WBC: 8.1 10*3/uL (ref 3.4–10.8)

## 2017-04-21 LAB — BRAIN NATRIURETIC PEPTIDE: BNP: 944.7 pg/mL — AB (ref 0.0–100.0)

## 2017-04-21 MED ORDER — PERFLUTREN LIPID MICROSPHERE
1.0000 mL | INTRAVENOUS | Status: AC | PRN
  Administered 2017-04-21: 2 mL via INTRAVENOUS

## 2017-04-21 NOTE — Progress Notes (Unsigned)
Spoke with Dr. Daneen Schick (DOD) about echo results, decreased EF and moderate TR and MR.  Follow up appointment already scheduled with Dr. Percival Spanish 04-26-17.  Dr. Tamala Julian Released patient to that appointment due to not being symptomatic.   Deliah [place], RDCS

## 2017-04-22 ENCOUNTER — Encounter: Payer: Self-pay | Admitting: Cardiology

## 2017-04-22 DIAGNOSIS — R0602 Shortness of breath: Secondary | ICD-10-CM | POA: Insufficient documentation

## 2017-04-25 NOTE — Progress Notes (Signed)
HPI The patient presents for followup of atrial fibrillation and known coronary disease. He called the other day and was added to my schedule to discuss increased leg edema.  I ordered an echo which demonstrated an EF of 30%.  I treated him with  Increased Lasix. He returns for follow up.   He is doing much better when I last saw him. His weight is down. His breathing is back to baseline. His leg swelling is improved. He denies any chest pressure, neck or arm discomfort. He's had no palpitations, presyncope or syncope. He's not been having any PND or orthopnea.   Allergies  Allergen Reactions  . Amoxicillin-Pot Clavulanate     Pt. Couldn't recall reaction  . Augmentin [Amoxicillin-Pot Clavulanate] Other (See Comments)    unknown    Current Outpatient Prescriptions  Medication Sig Dispense Refill  . allopurinol (ZYLOPRIM) 300 MG tablet Take 300 mg by mouth daily.      Marland Kitchen amLODipine (NORVASC) 5 MG tablet Take 5 mg by mouth daily.      Marland Kitchen atorvastatin (LIPITOR) 10 MG tablet Take 10 mg by mouth daily.      . furosemide (LASIX) 40 MG tablet Take 1 tablet (40 mg total) by mouth 2 (two) times daily. 180 tablet 3  . glyBURIDE (DIABETA) 5 MG tablet Take 5 mg by mouth 2 (two) times daily with a meal.      . HYDROcodone-acetaminophen (NORCO/VICODIN) 5-325 MG tablet One tablet every four hours as needed for acute pain.  Limit of five days per [place] statue. 30 tablet 0  . metFORMIN (GLUCOPHAGE) 500 MG tablet Take 500 mg by mouth 2 (two) times daily with a meal.     . nitroGLYCERIN (NITROSTAT) 0.4 MG SL tablet Place 1 tablet (0.4 mg total) under the tongue every 5 (five) minutes x 3 doses as needed for chest pain. If no relief after the 3rd dose, proceed to the ED for an evaluation 25 tablet 3  . Omega-3 Fatty Acids (FISH OIL) 1000 MG CAPS Take 1 capsule by mouth 2 (two) times daily.      . pioglitazone (ACTOS) 45 MG tablet Take 45 mg by mouth daily.     . potassium chloride SA (KLOR-CON M20) 20  MEQ tablet Take 1 tablet (20 mEq total) by mouth daily. 90 tablet 3  . quinapril (ACCUPRIL) 40 MG tablet Take 40 mg by mouth at bedtime.      Marland Kitchen warfarin (COUMADIN) 5 MG tablet Take 5 mg by mouth as directed.      . carvedilol (COREG) 3.125 MG tablet Take 1 tablet (3.125 mg total) by mouth 2 (two) times daily. 180 tablet 3   No current facility-administered medications for this visit.     Past Medical History:  Diagnosis Date  . Atrial fibrillation ([place])   . Atrial flutter ([place])   . Congestive heart failure, unspecified    45 - 50%  . Coronary atherosclerosis of native coronary artery   . CVA (cerebral infarction)    UNSPECIFIED  . Gout    History of   . History of ETOH abuse    Quit 20 years ago.   Marland Kitchen Hyperlipidemia   . Mitral valve insufficiency    Mild to mod on echo 2015  . Obesity   . Tobacco abuse    PREVIOUS  . Type II or unspecified type diabetes mellitus without mention of complication, not stated as uncontrolled   . Unspecified essential hypertension  Past Surgical History:  Procedure Laterality Date  . CARDIAC SURGERY    . CAROTID ENDARTERECTOMY  JULY 2001   RIGHT WITH DACRON PATCH  . CATARACT EXTRACTION    . CORONARY ARTERY BYPASS GRAFT  1999   X 4  . HERNIA REPAIR     LEFT ANAL    ROS:  As stated in the HPI and negative for all other systems.  PHYSICAL EXAM BP (!) 146/72   [place] 62   Ht 5\' 10"  (1.778 m)   Wt 206 lb (93.4 kg)   BMI 29.56 kg/m   GENERAL:  Well appearing NECK:  Positive jugular venous distention, waveform within normal limits, carotid upstroke brisk and symmetric, no bruits, no thyromegaly LUNGS:  Clear to auscultation bilaterally CHEST:  Unremarkable HEART:  PMI not displaced or sustained,S1 and S2 within normal limits, no S3, no clicks, no rubs, no murmurs, irregular ABD:  Flat, positive bowel sounds normal in frequency in pitch, no bruits, no rebound, no guarding, no midline pulsatile mass, no hepatomegaly, no splenomegaly EXT:   2 plus pulses upper and decreased DP/PT bilateral., mild bilateral leg edema, no cyanosis no clubbing   ASSESSMENT AND PLAN  ACUTE SYSTOLIC HF - His EF is newly reduced as above.  He needs right heart cath to assess his coronary anatomy with his almost 78 year old bypass grafts to schedule this with his wife can do it.  He will call to schedule.  He will need to hold the warfarin for four days prior.   I am going to start carvedilol 3.125 mg twice daily. He can have his Norvasc reduced as we uptitrate this and potentially switch to another medication over the course of the next many weeks. For now he will remain on the current dose of Lasix although I will check a basic metabolic profile.  ATRIAL FIBRILLATION -  [name] has a CHA2DS2 - VASc score of 4.  He will remain on current meds. He has not wanted a DOAC.   CAD -  This will be evaluated as above.  Carotid stenosis -  This is followed by Adrian Blitz, MD  This is followed by Adrian Blitz, MD   HTN (hypertension) -  This is being managed in the context of treating his CHF   DM - This is followed by Adrian Barrett.  This is followed by Adrian Blitz, MD.

## 2017-04-25 NOTE — H&P (View-Only) (Signed)
HPI The patient presents for followup of atrial fibrillation and known coronary disease. He called the other day and was added to my schedule to discuss increased leg edema.  I ordered an echo which demonstrated an EF of 30%.  I treated him with  Increased Lasix. He returns for follow up.   He is doing much better when I last saw him. His weight is down. His breathing is back to baseline. His leg swelling is improved. He denies any chest pressure, neck or arm discomfort. He's had no palpitations, presyncope or syncope. He's not been having any PND or orthopnea.   Allergies  Allergen Reactions  . Amoxicillin-Pot Clavulanate     Pt. Couldn't recall reaction  . Augmentin [Amoxicillin-Pot Clavulanate] Other (See Comments)    unknown    Current Outpatient Prescriptions  Medication Sig Dispense Refill  . allopurinol (ZYLOPRIM) 300 MG tablet Take 300 mg by mouth daily.      Marland Kitchen amLODipine (NORVASC) 5 MG tablet Take 5 mg by mouth daily.      Marland Kitchen atorvastatin (LIPITOR) 10 MG tablet Take 10 mg by mouth daily.      . furosemide (LASIX) 40 MG tablet Take 1 tablet (40 mg total) by mouth 2 (two) times daily. 180 tablet 3  . glyBURIDE (DIABETA) 5 MG tablet Take 5 mg by mouth 2 (two) times daily with a meal.      . HYDROcodone-acetaminophen (NORCO/VICODIN) 5-325 MG tablet One tablet every four hours as needed for acute pain.  Limit of five days per [place] statue. 30 tablet 0  . metFORMIN (GLUCOPHAGE) 500 MG tablet Take 500 mg by mouth 2 (two) times daily with a meal.     . nitroGLYCERIN (NITROSTAT) 0.4 MG SL tablet Place 1 tablet (0.4 mg total) under the tongue every 5 (five) minutes x 3 doses as needed for chest pain. If no relief after the 3rd dose, proceed to the ED for an evaluation 25 tablet 3  . Omega-3 Fatty Acids (FISH OIL) 1000 MG CAPS Take 1 capsule by mouth 2 (two) times daily.      . pioglitazone (ACTOS) 45 MG tablet Take 45 mg by mouth daily.     . potassium chloride SA (KLOR-CON M20) 20  MEQ tablet Take 1 tablet (20 mEq total) by mouth daily. 90 tablet 3  . quinapril (ACCUPRIL) 40 MG tablet Take 40 mg by mouth at bedtime.      Marland Kitchen warfarin (COUMADIN) 5 MG tablet Take 5 mg by mouth as directed.      . carvedilol (COREG) 3.125 MG tablet Take 1 tablet (3.125 mg total) by mouth 2 (two) times daily. 180 tablet 3   No current facility-administered medications for this visit.     Past Medical History:  Diagnosis Date  . Atrial fibrillation ([place])   . Atrial flutter ([place])   . Congestive heart failure, unspecified    45 - 50%  . Coronary atherosclerosis of native coronary artery   . CVA (cerebral infarction)    UNSPECIFIED  . Gout    History of   . History of ETOH abuse    Quit 20 years ago.   Marland Kitchen Hyperlipidemia   . Mitral valve insufficiency    Mild to mod on echo 2015  . Obesity   . Tobacco abuse    PREVIOUS  . Type II or unspecified type diabetes mellitus without mention of complication, not stated as uncontrolled   . Unspecified essential hypertension  Past Surgical History:  Procedure Laterality Date  . CARDIAC SURGERY    . CAROTID ENDARTERECTOMY  JULY 2001   RIGHT WITH DACRON PATCH  . CATARACT EXTRACTION    . CORONARY ARTERY BYPASS GRAFT  1999   X 4  . HERNIA REPAIR     LEFT ANAL    ROS:  As stated in the HPI and negative for all other systems.  PHYSICAL EXAM BP (!) 146/72   Pulse 62   Ht 5\' 10"  (1.778 m)   Wt 206 lb (93.4 kg)   BMI 29.56 kg/m   GENERAL:  Well appearing NECK:  Positive jugular venous distention, waveform within normal limits, carotid upstroke brisk and symmetric, no bruits, no thyromegaly LUNGS:  Clear to auscultation bilaterally CHEST:  Unremarkable HEART:  PMI not displaced or sustained,S1 and S2 within normal limits, no S3, no clicks, no rubs, no murmurs, irregular ABD:  Flat, positive bowel sounds normal in frequency in pitch, no bruits, no rebound, no guarding, no midline pulsatile mass, no hepatomegaly, no splenomegaly EXT:   2 plus pulses upper and decreased DP/PT bilateral., mild bilateral leg edema, no cyanosis no clubbing   ASSESSMENT AND PLAN  ACUTE SYSTOLIC HF - His EF is newly reduced as above.  He needs right heart cath to assess his coronary anatomy with his almost 78 year old bypass grafts to schedule this with his wife can do it.  He will call to schedule.  He will need to hold the warfarin for four days prior.   I am going to start carvedilol 3.125 mg twice daily. He can have his Norvasc reduced as we uptitrate this and potentially switch to another medication over the course of the next many weeks. For now he will remain on the current dose of Lasix although I will check a basic metabolic profile.  ATRIAL FIBRILLATION -  Mr. ZANIEL MARINEAU has a CHA2DS2 - VASc score of 4.  He will remain on current meds. He has not wanted a DOAC.   CAD -  This will be evaluated as above.  Carotid stenosis -  This is followed by Monico Blitz, MD  This is followed by Monico Blitz, MD   HTN (hypertension) -  This is being managed in the context of treating his CHF   DM - This is followed by Dr. Brigitte Pulse.  This is followed by Monico Blitz, MD.

## 2017-04-25 NOTE — Addendum Note (Signed)
Addended by: Vennie Homans on: 04/25/2017 08:19 AM   Modules accepted: Orders

## 2017-04-26 ENCOUNTER — Encounter: Payer: Self-pay | Admitting: Cardiology

## 2017-04-26 ENCOUNTER — Ambulatory Visit (INDEPENDENT_AMBULATORY_CARE_PROVIDER_SITE_OTHER): Payer: Medicare Other | Admitting: Cardiology

## 2017-04-26 VITALS — BP 146/72 | HR 62 | Ht 70.0 in | Wt 206.0 lb

## 2017-04-26 DIAGNOSIS — I5021 Acute systolic (congestive) heart failure: Secondary | ICD-10-CM | POA: Diagnosis not present

## 2017-04-26 DIAGNOSIS — Z79899 Other long term (current) drug therapy: Secondary | ICD-10-CM | POA: Diagnosis not present

## 2017-04-26 DIAGNOSIS — R0602 Shortness of breath: Secondary | ICD-10-CM | POA: Diagnosis not present

## 2017-04-26 DIAGNOSIS — I2510 Atherosclerotic heart disease of native coronary artery without angina pectoris: Secondary | ICD-10-CM | POA: Diagnosis not present

## 2017-04-26 MED ORDER — CARVEDILOL 3.125 MG PO TABS: 3.1250 mg | ORAL_TABLET | Freq: Two times a day (BID) | ORAL | 3 refills | Status: DC

## 2017-04-26 NOTE — Patient Instructions (Signed)
Medication Instructions:  START- Carvedilol 3.125 mg twice a day  If you need a refill on your cardiac medications before your next appointment, please call your pharmacy.  Labwork: BMP Today HERE IN OUR OFFICE AT [facility]  Testing/Procedures: None Ordered  Follow-Up: Your physician wants you to follow-up in: After Cath.    Special Instructions: Please call office when you can schedule Heart Cath in November   Thank you for choosing [facility] at [place]!!

## 2017-04-27 ENCOUNTER — Encounter: Payer: Self-pay | Admitting: Cardiology

## 2017-04-27 DIAGNOSIS — I5041 Acute combined systolic (congestive) and diastolic (congestive) heart failure: Secondary | ICD-10-CM | POA: Insufficient documentation

## 2017-04-27 LAB — BASIC METABOLIC PANEL
BUN / CREAT RATIO: 24 (ref 10–24)
BUN: 36 mg/dL — ABNORMAL HIGH (ref 8–27)
CHLORIDE: 100 mmol/L (ref 96–106)
CO2: 21 mmol/L (ref 20–29)
Calcium: 10 mg/dL (ref 8.6–10.2)
Creatinine, Ser: 1.49 mg/dL — ABNORMAL HIGH (ref 0.76–1.27)
GFR calc non Af Amer: 44 mL/min/{1.73_m2} — ABNORMAL LOW (ref >59)
GFR, EST AFRICAN AMERICAN: 51 mL/min/{1.73_m2} — AB (ref >59)
GLUCOSE: 95 mg/dL (ref 65–99)
POTASSIUM: 4.3 mmol/L (ref 3.5–5.2)
SODIUM: 144 mmol/L (ref 134–144)

## 2017-04-29 ENCOUNTER — Telehealth: Payer: Self-pay | Admitting: Cardiology

## 2017-04-29 DIAGNOSIS — R5383 Other fatigue: Secondary | ICD-10-CM

## 2017-04-29 DIAGNOSIS — D689 Coagulation defect, unspecified: Secondary | ICD-10-CM

## 2017-04-29 DIAGNOSIS — Z01812 Encounter for preprocedural laboratory examination: Secondary | ICD-10-CM

## 2017-04-29 DIAGNOSIS — I5021 Acute systolic (congestive) heart failure: Secondary | ICD-10-CM

## 2017-04-29 MED ORDER — FUROSEMIDE 40 MG PO TABS: 40.0000 mg | ORAL_TABLET | Freq: Every day | ORAL | Status: DC

## 2017-04-29 NOTE — Telephone Encounter (Signed)
Pt informed of BMET results from 10/17. He verbalized understanding of instruction to stop BID lasix 40mg  and just do once daily. He is asking if he can get a call about scheduling his cath, because he is ready to have this done.

## 2017-04-29 NOTE — Telephone Encounter (Signed)
@LOGO @  [place] [address] [place] [address] Dept: [phone] Loc: [place]  04/29/2017  You are scheduled for a Cardiac Catheterization on Monday, November 5 with Dr. Glenetta Hew.  1. Please arrive at the [facility] (Main Entrance A) at [facility]: [address] [place],  [id number] at 8:00 AM (two hours before your procedure to ensure your preparation). Free valet parking service is available.   Special note: Every effort is made to have your procedure done on time. Please understand that emergencies sometimes delay scheduled procedures.  2. Diet: Do not eat or drink anything after midnight prior to your procedure except sips of water to take medications.  3. Labs: 7 days prior to procedure  4. Medication instructions in preparation for your procedure:   Current Outpatient Prescriptions (Endocrine & Metabolic):  .  glyBURIDE (DIABETA) 5 MG tablet, Take 5 mg by mouth 2 (two) times daily with a meal.   .  metFORMIN (GLUCOPHAGE) 500 MG tablet, Take 500 mg by mouth 2 (two) times daily with a meal.  .  pioglitazone (ACTOS) 45 MG tablet, Take 45 mg by mouth daily.   Current Outpatient Prescriptions (Cardiovascular):  .  amLODipine (NORVASC) 5 MG tablet, Take 5 mg by mouth daily.   Marland Kitchen  atorvastatin (LIPITOR) 10 MG tablet, Take 10 mg by mouth daily.   .  carvedilol (COREG) 3.125 MG tablet, Take 1 tablet (3.125 mg total) by mouth 2 (two) times daily. .  furosemide (LASIX) 40 MG tablet, Take 1 tablet (40 mg total) by mouth daily. .  nitroGLYCERIN (NITROSTAT) 0.4 MG SL tablet, Place 1 tablet (0.4 mg total) under the tongue every 5 (five) minutes x 3 doses as needed for chest pain. If no relief after the 3rd dose, proceed to the ED for an evaluation .  quinapril (ACCUPRIL) 40 MG tablet, Take 40 mg by mouth at bedtime.     Current Outpatient  Prescriptions (Analgesics):  .  allopurinol (ZYLOPRIM) 300 MG tablet, Take 300 mg by mouth daily.   Marland Kitchen  HYDROcodone-acetaminophen (NORCO/VICODIN) 5-325 MG tablet, One tablet every four hours as needed for acute pain.  Limit of five days per [place] statue.  Current Outpatient Prescriptions (Hematological):  .  warfarin (COUMADIN) 5 MG tablet, Take 5 mg by mouth as directed.    Current Outpatient Prescriptions (Other):  Marland Kitchen  Omega-3 Fatty Acids (FISH OIL) 1000 MG CAPS, Take 1 capsule by mouth 2 (two) times daily.   .  potassium chloride SA (KLOR-CON M20) 20 MEQ tablet, Take 1 tablet (20 mEq total) by mouth daily. *For reference purposes while preparing patient instructions.   Delete this med list prior to printing instructions for patient.*  Stop taking Coumadin (Warfarin) on Friday, November 2.  Stop taking, Glucophage (Metformin) on Sunday, November 4.  And 2 days after procedure. Do not take Glyburide and Pioglitazone(Actos) the day of procedure.   On the morning of your procedure, take your morning medicines listed above.  You may use sips of water.  5. Plan for one night stay--bring personal belongings. 6. Bring a current list of your medications and current insurance cards. 7. You MUST have a responsible person to drive you home. 8. Someone MUST be with you the first 24 hours after you arrive home or your discharge will be delayed. 9. Please wear clothes that are easy to get on and off and wear slip-on  shoes.  Thank you for allowing [place] to care for you!   -- [facility]

## 2017-04-29 NOTE — Telephone Encounter (Signed)
Ivy ( [place]- not on DPR) is calling about the results of Adrian Barrett Lab Results . Can call Mr. Lograsso . Thanks

## 2017-04-29 NOTE — Telephone Encounter (Signed)
Patient is to have cath scheduled.

## 2017-04-29 NOTE — Telephone Encounter (Signed)
Pt is coming in for lab work October 25th told pt to asked for me to go over Cath instructions, a copy of instruction will be mailed out to pt along with his blood work.

## 2017-04-29 NOTE — Addendum Note (Signed)
Addended by: Vennie Homans on: 04/29/2017 04:37 PM   Modules accepted: Orders

## 2017-05-05 DIAGNOSIS — I5021 Acute systolic (congestive) heart failure: Secondary | ICD-10-CM | POA: Diagnosis not present

## 2017-05-05 DIAGNOSIS — Z01812 Encounter for preprocedural laboratory examination: Secondary | ICD-10-CM | POA: Diagnosis not present

## 2017-05-05 DIAGNOSIS — D689 Coagulation defect, unspecified: Secondary | ICD-10-CM | POA: Diagnosis not present

## 2017-05-05 DIAGNOSIS — R5383 Other fatigue: Secondary | ICD-10-CM | POA: Diagnosis not present

## 2017-05-05 LAB — BASIC METABOLIC PANEL
BUN / CREAT RATIO: 31 — AB (ref 10–24)
BUN: 44 mg/dL — ABNORMAL HIGH (ref 8–27)
CHLORIDE: 104 mmol/L (ref 96–106)
CO2: 23 mmol/L (ref 20–29)
Calcium: 9.3 mg/dL (ref 8.6–10.2)
Creatinine, Ser: 1.43 mg/dL — ABNORMAL HIGH (ref 0.76–1.27)
GFR calc Af Amer: 54 mL/min/{1.73_m2} — ABNORMAL LOW (ref >59)
GFR calc non Af Amer: 47 mL/min/{1.73_m2} — ABNORMAL LOW (ref >59)
Glucose: 316 mg/dL — ABNORMAL HIGH (ref 65–99)
Potassium: 5.3 mmol/L — ABNORMAL HIGH (ref 3.5–5.2)
SODIUM: 140 mmol/L (ref 134–144)

## 2017-05-05 LAB — TSH: TSH: 2.74 u[IU]/mL (ref 0.450–4.500)

## 2017-05-05 LAB — CBC
Hematocrit: 42.2 % (ref 37.5–51.0)
Hemoglobin: 14 g/dL (ref 13.0–17.7)
MCH: 33.7 pg — AB (ref 26.6–33.0)
MCHC: 33.2 g/dL (ref 31.5–35.7)
MCV: 102 fL — ABNORMAL HIGH (ref 79–97)
Platelets: 135 10*3/uL — ABNORMAL LOW (ref 150–379)
RBC: 4.15 x10E6/uL (ref 4.14–5.80)
RDW: 14.3 % (ref 12.3–15.4)
WBC: 6.7 10*3/uL (ref 3.4–10.8)

## 2017-05-05 LAB — PROTIME-INR
INR: 1.9 — ABNORMAL HIGH (ref 0.8–1.2)
Prothrombin Time: 19 s — ABNORMAL HIGH (ref 9.1–12.0)

## 2017-05-05 LAB — APTT: aPTT: 33 s (ref 24–33)

## 2017-05-13 ENCOUNTER — Telehealth: Payer: Self-pay

## 2017-05-13 NOTE — Telephone Encounter (Signed)
Left message.  No DPR on file, but needed to ensure correct medication regimen.  Left message with following information:  Take ASA and carvedilol on am of procedure Warfarin-last dose Thursday prior to procedure Sunday- do not take metformin, take half dose lasix Procedure day:  Hold accupril, actos, glyburide, lasix  Left this nurse name and # for call back if any questions.

## 2017-05-16 ENCOUNTER — Other Ambulatory Visit: Payer: Self-pay

## 2017-05-16 ENCOUNTER — Encounter (HOSPITAL_COMMUNITY)
Admission: RE | Disposition: A | Discharge status: Home / Self Care | Payer: Self-pay | Source: Ambulatory Visit | Attending: Cardiology

## 2017-05-16 ENCOUNTER — Ambulatory Visit (HOSPITAL_COMMUNITY)
Admission: RE | Admit: 2017-05-16 | Discharge: 2017-05-17 | Disposition: A | Discharge status: Home / Self Care | Payer: Medicare Other | Source: Ambulatory Visit | Attending: Cardiology | Admitting: Cardiology

## 2017-05-16 ENCOUNTER — Encounter (HOSPITAL_COMMUNITY): Payer: Self-pay | Admitting: Cardiology

## 2017-05-16 DIAGNOSIS — I255 Ischemic cardiomyopathy: Secondary | ICD-10-CM | POA: Insufficient documentation

## 2017-05-16 DIAGNOSIS — I2582 Chronic total occlusion of coronary artery: Secondary | ICD-10-CM | POA: Insufficient documentation

## 2017-05-16 DIAGNOSIS — I481 Persistent atrial fibrillation: Secondary | ICD-10-CM | POA: Insufficient documentation

## 2017-05-16 DIAGNOSIS — I5041 Acute combined systolic (congestive) and diastolic (congestive) heart failure: Secondary | ICD-10-CM | POA: Diagnosis not present

## 2017-05-16 DIAGNOSIS — Z880 Allergy status to penicillin: Secondary | ICD-10-CM | POA: Diagnosis not present

## 2017-05-16 DIAGNOSIS — I080 Rheumatic disorders of both mitral and aortic valves: Secondary | ICD-10-CM | POA: Diagnosis present

## 2017-05-16 DIAGNOSIS — Z6829 Body mass index (BMI) 29.0-29.9, adult: Secondary | ICD-10-CM | POA: Diagnosis not present

## 2017-05-16 DIAGNOSIS — Z7984 Long term (current) use of oral hypoglycemic drugs: Secondary | ICD-10-CM | POA: Diagnosis not present

## 2017-05-16 DIAGNOSIS — M109 Gout, unspecified: Secondary | ICD-10-CM | POA: Diagnosis not present

## 2017-05-16 DIAGNOSIS — I5043 Acute on chronic combined systolic (congestive) and diastolic (congestive) heart failure: Secondary | ICD-10-CM | POA: Insufficient documentation

## 2017-05-16 DIAGNOSIS — I110 Hypertensive heart disease with heart failure: Secondary | ICD-10-CM | POA: Diagnosis not present

## 2017-05-16 DIAGNOSIS — I6529 Occlusion and stenosis of unspecified carotid artery: Secondary | ICD-10-CM | POA: Insufficient documentation

## 2017-05-16 DIAGNOSIS — I25710 Atherosclerosis of autologous vein coronary artery bypass graft(s) with unstable angina pectoris: Secondary | ICD-10-CM | POA: Insufficient documentation

## 2017-05-16 DIAGNOSIS — E785 Hyperlipidemia, unspecified: Secondary | ICD-10-CM | POA: Insufficient documentation

## 2017-05-16 DIAGNOSIS — E119 Type 2 diabetes mellitus without complications: Secondary | ICD-10-CM | POA: Diagnosis not present

## 2017-05-16 DIAGNOSIS — E669 Obesity, unspecified: Secondary | ICD-10-CM | POA: Insufficient documentation

## 2017-05-16 DIAGNOSIS — I10 Essential (primary) hypertension: Secondary | ICD-10-CM | POA: Diagnosis present

## 2017-05-16 DIAGNOSIS — Z955 Presence of coronary angioplasty implant and graft: Secondary | ICD-10-CM

## 2017-05-16 DIAGNOSIS — Z7901 Long term (current) use of anticoagulants: Secondary | ICD-10-CM | POA: Diagnosis not present

## 2017-05-16 DIAGNOSIS — I4892 Unspecified atrial flutter: Secondary | ICD-10-CM | POA: Diagnosis not present

## 2017-05-16 DIAGNOSIS — Z8673 Personal history of transient ischemic attack (TIA), and cerebral infarction without residual deficits: Secondary | ICD-10-CM | POA: Insufficient documentation

## 2017-05-16 DIAGNOSIS — I2729 Other secondary pulmonary hypertension: Secondary | ICD-10-CM | POA: Diagnosis not present

## 2017-05-16 DIAGNOSIS — I482 Chronic atrial fibrillation, unspecified: Secondary | ICD-10-CM | POA: Diagnosis present

## 2017-05-16 DIAGNOSIS — I25110 Atherosclerotic heart disease of native coronary artery with unstable angina pectoris: Secondary | ICD-10-CM | POA: Diagnosis not present

## 2017-05-16 HISTORY — DX: Gout, unspecified: M10.9

## 2017-05-16 HISTORY — DX: Type 2 diabetes mellitus without complications: E11.9

## 2017-05-16 HISTORY — PX: CORONARY STENT INTERVENTION: CATH118234

## 2017-05-16 HISTORY — DX: Acute myocardial infarction, unspecified: I21.9

## 2017-05-16 HISTORY — PX: CORONARY ANGIOPLASTY WITH STENT PLACEMENT: SHX49

## 2017-05-16 HISTORY — DX: Chronic obstructive pulmonary disease, unspecified: J44.9

## 2017-05-16 HISTORY — DX: Unspecified osteoarthritis, unspecified site: M19.90

## 2017-05-16 HISTORY — PX: RIGHT/LEFT HEART CATH AND CORONARY/GRAFT ANGIOGRAPHY: CATH118267

## 2017-05-16 HISTORY — DX: Cerebral infarction, unspecified: I63.9

## 2017-05-16 LAB — POCT I-STAT 3, VENOUS BLOOD GAS (G3P V)
Acid-Base Excess: 1 mmol/L (ref 0.0–2.0)
BICARBONATE: 25.4 mmol/L (ref 20.0–28.0)
BICARBONATE: 26.3 mmol/L (ref 20.0–28.0)
O2 SAT: 64 %
O2 SAT: 70 %
PCO2 VEN: 43.4 mmHg — AB (ref 44.0–60.0)
PH VEN: 7.37 (ref 7.250–7.430)
PO2 VEN: 37 mmHg (ref 32.0–45.0)
TCO2: 27 mmol/L (ref 22–32)
TCO2: 28 mmol/L (ref 22–32)
pCO2, Ven: 44 mmHg (ref 44.0–60.0)
pH, Ven: 7.391 (ref 7.250–7.430)
pO2, Ven: 34 mmHg (ref 32.0–45.0)

## 2017-05-16 LAB — POCT I-STAT 3, ART BLOOD GAS (G3+)
ACID-BASE DEFICIT: 1 mmol/L (ref 0.0–2.0)
BICARBONATE: 24.4 mmol/L (ref 20.0–28.0)
O2 SAT: 98 %
PH ART: 7.388 (ref 7.350–7.450)
TCO2: 26 mmol/L (ref 22–32)
pCO2 arterial: 40.5 mmHg (ref 32.0–48.0)
pO2, Arterial: 101 mmHg (ref 83.0–108.0)

## 2017-05-16 LAB — PROTIME-INR
INR: 1.33
Prothrombin Time: 16.3 seconds — ABNORMAL HIGH (ref 11.4–15.2)

## 2017-05-16 LAB — GLUCOSE, CAPILLARY
Glucose-Capillary: 132 mg/dL — ABNORMAL HIGH (ref 65–99)
Glucose-Capillary: 123 mg/dL — ABNORMAL HIGH (ref 65–99)
Glucose-Capillary: 130 mg/dL — ABNORMAL HIGH (ref 65–99)

## 2017-05-16 LAB — POCT ACTIVATED CLOTTING TIME: Activated Clotting Time: 307 seconds

## 2017-05-16 SURGERY — RIGHT/LEFT HEART CATH AND CORONARY/GRAFT ANGIOGRAPHY
Anesthesia: LOCAL

## 2017-05-16 MED ORDER — VERAPAMIL HCL 2.5 MG/ML IV SOLN
INTRAVENOUS | Status: DC | PRN
  Administered 2017-05-16 (×2): 5 mL via INTRA_ARTERIAL

## 2017-05-16 MED ORDER — ALBUTEROL SULFATE (2.5 MG/3ML) 0.083% IN NEBU: 3.0000 mL | INHALATION_SOLUTION | Freq: Four times a day (QID) | RESPIRATORY_TRACT | Status: DC | PRN

## 2017-05-16 MED ORDER — FUROSEMIDE 10 MG/ML IJ SOLN
40.0000 mg | Freq: Two times a day (BID) | INTRAMUSCULAR | Status: DC
  Administered 2017-05-16 – 2017-05-17 (×2): 40 mg via INTRAVENOUS
  Filled 2017-05-16 (×2): qty 4

## 2017-05-16 MED ORDER — SODIUM CHLORIDE 0.9 % IV SOLN: 250.0000 mL | INTRAVENOUS | Status: DC | PRN

## 2017-05-16 MED ORDER — CLOPIDOGREL BISULFATE 300 MG PO TABS
ORAL_TABLET | ORAL | Status: AC
  Filled 2017-05-16: qty 2

## 2017-05-16 MED ORDER — IOPAMIDOL (ISOVUE-370) INJECTION 76%
INTRAVENOUS | Status: AC
  Filled 2017-05-16: qty 50

## 2017-05-16 MED ORDER — HEPARIN (PORCINE) IN NACL 2-0.9 UNIT/ML-% IJ SOLN
INTRAMUSCULAR | Status: AC | PRN
  Administered 2017-05-16: 1000 mL

## 2017-05-16 MED ORDER — ANGIOPLASTY BOOK
Freq: Once | Status: AC
  Administered 2017-05-16: 23:00:00 1
  Filled 2017-05-16: qty 1

## 2017-05-16 MED ORDER — SODIUM CHLORIDE 0.9% FLUSH
3.0000 mL | Freq: Two times a day (BID) | INTRAVENOUS | Status: DC
  Administered 2017-05-16: 3 mL via INTRAVENOUS

## 2017-05-16 MED ORDER — HYDROCODONE-ACETAMINOPHEN 5-325 MG PO TABS: 1.0000 | ORAL_TABLET | Freq: Four times a day (QID) | ORAL | Status: DC | PRN

## 2017-05-16 MED ORDER — ASPIRIN 81 MG PO CHEW: 81.0000 mg | CHEWABLE_TABLET | Freq: Once | ORAL | Status: DC

## 2017-05-16 MED ORDER — WARFARIN - PHARMACIST DOSING INPATIENT
Freq: Every day | Status: DC
  Administered 2017-05-16: 17:00:00

## 2017-05-16 MED ORDER — ONDANSETRON HCL 4 MG/2ML IJ SOLN: 4.0000 mg | Freq: Four times a day (QID) | INTRAMUSCULAR | Status: DC | PRN

## 2017-05-16 MED ORDER — SODIUM CHLORIDE 0.9% FLUSH
3.0000 mL | Freq: Two times a day (BID) | INTRAVENOUS | Status: DC
  Administered 2017-05-16 – 2017-05-17 (×2): 3 mL via INTRAVENOUS

## 2017-05-16 MED ORDER — ALLOPURINOL 300 MG PO TABS
300.0000 mg | ORAL_TABLET | Freq: Every day | ORAL | Status: DC
  Administered 2017-05-16 – 2017-05-17 (×2): 300 mg via ORAL
  Filled 2017-05-16 (×2): qty 1

## 2017-05-16 MED ORDER — SODIUM CHLORIDE 0.9% FLUSH: 3.0000 mL | INTRAVENOUS | Status: DC | PRN

## 2017-05-16 MED ORDER — HYDRALAZINE HCL 20 MG/ML IJ SOLN: 5.0000 mg | INTRAMUSCULAR | Status: AC | PRN

## 2017-05-16 MED ORDER — IOPAMIDOL (ISOVUE-370) INJECTION 76%
INTRAVENOUS | Status: DC | PRN
  Administered 2017-05-16: 245 mL via INTRA_ARTERIAL

## 2017-05-16 MED ORDER — SODIUM CHLORIDE 0.9 % IV SOLN: INTRAVENOUS | Status: AC

## 2017-05-16 MED ORDER — QUINAPRIL HCL 10 MG PO TABS
40.0000 mg | ORAL_TABLET | Freq: Every day | ORAL | Status: DC
  Administered 2017-05-16 – 2017-05-17 (×2): 40 mg via ORAL
  Filled 2017-05-16 (×2): qty 4

## 2017-05-16 MED ORDER — MORPHINE SULFATE (PF) 4 MG/ML IV SOLN: 1.0000 mg | INTRAVENOUS | Status: DC | PRN

## 2017-05-16 MED ORDER — CLOPIDOGREL BISULFATE 75 MG PO TABS
75.0000 mg | ORAL_TABLET | Freq: Every day | ORAL | Status: DC
  Administered 2017-05-17: 75 mg via ORAL
  Filled 2017-05-16: qty 1

## 2017-05-16 MED ORDER — TICAGRELOR 90 MG PO TABS
ORAL_TABLET | ORAL | Status: AC
Start: 2017-05-16 — End: ?
  Filled 2017-05-16: qty 2

## 2017-05-16 MED ORDER — CARVEDILOL 3.125 MG PO TABS
3.1250 mg | ORAL_TABLET | Freq: Two times a day (BID) | ORAL | Status: DC
  Administered 2017-05-17: 3.125 mg via ORAL
  Filled 2017-05-16: qty 1

## 2017-05-16 MED ORDER — MIDAZOLAM HCL 2 MG/2ML IJ SOLN
INTRAMUSCULAR | Status: AC
  Filled 2017-05-16: qty 2

## 2017-05-16 MED ORDER — GLYBURIDE 5 MG PO TABS
5.0000 mg | ORAL_TABLET | Freq: Two times a day (BID) | ORAL | Status: DC
  Administered 2017-05-16 – 2017-05-17 (×2): 5 mg via ORAL
  Filled 2017-05-16 (×2): qty 1

## 2017-05-16 MED ORDER — MORPHINE SULFATE (PF) 10 MG/ML IV SOLN: 1.0000 mg | INTRAVENOUS | Status: DC | PRN

## 2017-05-16 MED ORDER — SODIUM CHLORIDE 0.9 % IV SOLN
INTRAVENOUS | Status: DC
  Administered 2017-05-16: 08:00:00 via INTRAVENOUS

## 2017-05-16 MED ORDER — POTASSIUM CHLORIDE CRYS ER 20 MEQ PO TBCR
20.0000 meq | EXTENDED_RELEASE_TABLET | Freq: Every day | ORAL | Status: DC
  Administered 2017-05-17: 20 meq via ORAL
  Filled 2017-05-16 (×2): qty 1

## 2017-05-16 MED ORDER — WARFARIN SODIUM 7.5 MG PO TABS
7.5000 mg | ORAL_TABLET | Freq: Once | ORAL | Status: AC
  Administered 2017-05-16: 17:00:00 7.5 mg via ORAL
  Filled 2017-05-16: qty 1

## 2017-05-16 MED ORDER — ACETAMINOPHEN 325 MG PO TABS
650.0000 mg | ORAL_TABLET | ORAL | Status: DC | PRN
  Administered 2017-05-17: 650 mg via ORAL
  Filled 2017-05-16: qty 2

## 2017-05-16 MED ORDER — CLOPIDOGREL BISULFATE 300 MG PO TABS
ORAL_TABLET | ORAL | Status: DC | PRN
  Administered 2017-05-16: 600 mg via ORAL

## 2017-05-16 MED ORDER — WARFARIN SODIUM 5 MG PO TABS: 5.0000 mg | ORAL_TABLET | Freq: Every day | ORAL | Status: DC

## 2017-05-16 MED ORDER — FENTANYL CITRATE (PF) 100 MCG/2ML IJ SOLN
INTRAMUSCULAR | Status: AC
  Filled 2017-05-16: qty 2

## 2017-05-16 MED ORDER — HEPARIN SODIUM (PORCINE) 1000 UNIT/ML IJ SOLN
INTRAMUSCULAR | Status: AC
  Filled 2017-05-16: qty 1

## 2017-05-16 MED ORDER — VERAPAMIL HCL 2.5 MG/ML IV SOLN
INTRAVENOUS | Status: AC
  Filled 2017-05-16: qty 2

## 2017-05-16 MED ORDER — HEPARIN SODIUM (PORCINE) 1000 UNIT/ML IJ SOLN
INTRAMUSCULAR | Status: DC | PRN
  Administered 2017-05-16 (×2): 5000 [IU] via INTRAVENOUS

## 2017-05-16 MED ORDER — MIDAZOLAM HCL 2 MG/2ML IJ SOLN
INTRAMUSCULAR | Status: DC | PRN
  Administered 2017-05-16: 1 mg via INTRAVENOUS

## 2017-05-16 MED ORDER — IOPAMIDOL (ISOVUE-370) INJECTION 76%
INTRAVENOUS | Status: AC
  Filled 2017-05-16: qty 125

## 2017-05-16 MED ORDER — LIDOCAINE HCL (PF) 1 % IJ SOLN
INTRAMUSCULAR | Status: DC | PRN
  Administered 2017-05-16: 2 mL via INTRADERMAL
  Administered 2017-05-16: 5 mL via INTRADERMAL

## 2017-05-16 MED ORDER — NITROGLYCERIN 0.4 MG SL SUBL: 0.4000 mg | SUBLINGUAL_TABLET | SUBLINGUAL | Status: DC | PRN

## 2017-05-16 MED ORDER — LIDOCAINE HCL (PF) 1 % IJ SOLN
INTRAMUSCULAR | Status: AC
  Filled 2017-05-16: qty 30

## 2017-05-16 MED ORDER — LABETALOL HCL 5 MG/ML IV SOLN: 10.0000 mg | INTRAVENOUS | Status: AC | PRN

## 2017-05-16 MED ORDER — ATORVASTATIN CALCIUM 10 MG PO TABS
10.0000 mg | ORAL_TABLET | Freq: Every day | ORAL | Status: DC
  Administered 2017-05-16 – 2017-05-17 (×2): 10 mg via ORAL
  Filled 2017-05-16 (×3): qty 1

## 2017-05-16 MED ORDER — FENTANYL CITRATE (PF) 100 MCG/2ML IJ SOLN
INTRAMUSCULAR | Status: DC | PRN
  Administered 2017-05-16: 25 ug via INTRAVENOUS

## 2017-05-16 MED ORDER — HEPARIN (PORCINE) IN NACL 2-0.9 UNIT/ML-% IJ SOLN
INTRAMUSCULAR | Status: AC
  Filled 2017-05-16: qty 1000

## 2017-05-16 MED ORDER — AMLODIPINE BESYLATE 5 MG PO TABS
5.0000 mg | ORAL_TABLET | Freq: Every evening | ORAL | Status: DC
  Administered 2017-05-16: 17:00:00 5 mg via ORAL
  Filled 2017-05-16: qty 1

## 2017-05-16 SURGICAL SUPPLY — 25 items
BALLN EMERGE MR 2.5X8 (BALLOONS) ×2
BALLOON EMERGE MR 2.5X8 (BALLOONS) IMPLANT
CATH BALLN WEDGE 5F 110CM (CATHETERS) ×1 IMPLANT
CATH INFINITI 5FR AL1 (CATHETERS) ×1 IMPLANT
CATH INFINITI JR4 5F (CATHETERS) ×1 IMPLANT
CATH OPTITORQUE TIG 4.0 5F (CATHETERS) ×1 IMPLANT
CATH VISTA GUIDE 6FR XBLAD3.5 (CATHETERS) ×1 IMPLANT
COVER PRB 48X5XTLSCP FOLD TPE (BAG) IMPLANT
COVER PROBE 5X48 (BAG) ×2
DEVICE RAD COMP TR BAND LRG (VASCULAR PRODUCTS) ×1 IMPLANT
GLIDESHEATH SLEND A-KIT 6F 22G (SHEATH) ×1 IMPLANT
GUIDEWIRE .025 260CM (WIRE) ×1 IMPLANT
GUIDEWIRE INQWIRE 1.5J.035X260 (WIRE) IMPLANT
INQWIRE 1.5J .035X260CM (WIRE) ×2
KIT ENCORE 26 ADVANTAGE (KITS) ×1 IMPLANT
KIT HEART LEFT (KITS) ×2 IMPLANT
PACK CARDIAC CATHETERIZATION (CUSTOM PROCEDURE TRAY) ×2 IMPLANT
SHEATH GLIDE SLENDER 4/5FR (SHEATH) ×1 IMPLANT
STENT SIERRA 3.00 X 08 MM (Permanent Stent) ×1 IMPLANT
STENT SIERRA 3.00 X 12 MM (Permanent Stent) ×1 IMPLANT
TRANSDUCER W/STOPCOCK (MISCELLANEOUS) ×2 IMPLANT
TUBING CIL FLEX 10 FLL-RA (TUBING) ×2 IMPLANT
VALVE GUARDIAN II ~~LOC~~ HEMO (MISCELLANEOUS) ×1 IMPLANT
WIRE ASAHI PROWATER 180CM (WIRE) ×1 IMPLANT
WIRE HI TORQ BMW 190CM (WIRE) ×1 IMPLANT

## 2017-05-16 NOTE — Progress Notes (Signed)
ANTICOAGULATION CONSULT NOTE - Initial Consult  Pharmacy Consult for coumadin Indication: chest pain/ACS and atrial fibrillation  Allergies  Allergen Reactions  . Augmentin [Amoxicillin-Pot Clavulanate] Other (See Comments)    Unknown Has patient had a PCN reaction causing immediate rash, facial/tongue/throat swelling, SOB or lightheadedness with hypotension: Unknown Has patient had a PCN reaction causing severe rash involving mucus membranes or skin necrosis: Unknown Has patient had a PCN reaction that required hospitalization: Unknown Has patient had a PCN reaction occurring within the last 10 years: No If all of the above answers are "NO", then may proceed with Cephalosporin use.     Patient Measurements: Height: 5\' 10"  (177.8 cm) Weight: 210 lb (95.3 kg) IBW/kg (Calculated) : 73   Vital Signs: Temp: 98.4 F (36.9 C) (11/05 1213) Temp Source: Oral (11/05 1213) BP: 148/96 (11/05 1213) Pulse Rate: 43 (11/05 1213)  Labs: Recent Labs    05/16/17 0810  LABPROT 16.3*  INR 1.33    Estimated Creatinine Clearance: 49.3 mL/min (A) (by C-G formula based on SCr of 1.43 mg/dL (H)).   Medical History: Past Medical History:  Diagnosis Date  . Atrial fibrillation ([place])   . Atrial flutter ([place])   . Congestive heart failure, unspecified    45 - 50%  . Coronary atherosclerosis of native coronary artery   . CVA (cerebral infarction)    UNSPECIFIED  . Gout    History of   . History of ETOH abuse    Quit 20 years ago.   Marland Kitchen Hyperlipidemia   . Mitral valve insufficiency    Mild to mod on echo 2015  . Obesity   . Tobacco abuse    PREVIOUS  . Type II or unspecified type diabetes mellitus without mention of complication, not stated as uncontrolled   . Unspecified essential hypertension     Medications:  Medications Prior to Admission  Medication Sig Dispense Refill Last Dose  . albuterol (PROVENTIL HFA;VENTOLIN HFA) 108 (90 Base) MCG/ACT inhaler Inhale 2 puffs into the  lungs every 6 (six) hours as needed for wheezing or shortness of breath.   Past Month at Unknown time  . allopurinol (ZYLOPRIM) 300 MG tablet Take 300 mg by mouth daily.     Past Week at Unknown time  . amLODipine (NORVASC) 5 MG tablet Take 5 mg by mouth every evening.    05/15/2017 at Unknown time  . aspirin 81 MG chewable tablet Chew 81 mg once by mouth.   05/16/2017 at 0700  . atorvastatin (LIPITOR) 10 MG tablet Take 10 mg by mouth daily.     05/15/2017 at Unknown time  . carvedilol (COREG) 3.125 MG tablet Take 1 tablet (3.125 mg total) by mouth 2 (two) times daily. 180 tablet 3 05/16/2017 at 0700  . furosemide (LASIX) 40 MG tablet Take 1 tablet (40 mg total) by mouth daily.   05/15/2017 at Unknown time  . glyBURIDE (DIABETA) 5 MG tablet Take 5 mg by mouth 2 (two) times daily with a meal.     05/15/2017 at Unknown time  . HYDROcodone-acetaminophen (NORCO/VICODIN) 5-325 MG tablet One tablet every four hours as needed for acute pain.  Limit of five days per [place] statue. (Patient taking differently: Take 1 tablet by mouth daily as needed. One tablet every four hours as needed for acute pain.  Limit of five days per [place] statue.) 30 tablet 0 Past Month at Unknown time  . Ibuprofen-Diphenhydramine Cit (ADVIL PM PO) Take 1 tablet by mouth daily as needed (  sleep).    Past Week at Unknown time  . metFORMIN (GLUCOPHAGE) 500 MG tablet Take 500 mg by mouth 2 (two) times daily with a meal.    Past Week at Unknown time  . Omega-3 Fatty Acids (FISH OIL) 1000 MG CAPS Take 1,000 mg by mouth daily.    Past Week at Unknown time  . pioglitazone (ACTOS) 45 MG tablet Take 45 mg by mouth daily.    Past Week at Unknown time  . potassium chloride SA (KLOR-CON M20) 20 MEQ tablet Take 1 tablet (20 mEq total) by mouth daily. 90 tablet 3 05/15/2017 at Unknown time  . quinapril (ACCUPRIL) 40 MG tablet Take 40 mg by mouth daily.    05/15/2017 at Unknown time  . warfarin (COUMADIN) 5 MG tablet Take 5-7.5 mg by mouth daily at 6  PM. Takes 5 mg on Monday, Wednesday and Friday.  All other days takes 7.5 mg   05/12/2017  . nitroGLYCERIN (NITROSTAT) 0.4 MG SL tablet Place 1 tablet (0.4 mg total) under the tongue every 5 (five) minutes x 3 doses as needed for chest pain. If no relief after the 3rd dose, proceed to the ED for an evaluation 25 tablet 3 Unknown at Unknown time   Scheduled:  . allopurinol  300 mg Oral Daily  . amLODipine  5 mg Oral QPM  . atorvastatin  10 mg Oral Daily  . carvedilol  3.125 mg Oral BID  . [START ON 05/17/2017] clopidogrel  75 mg Oral Q breakfast  . furosemide  40 mg Intravenous BID  . glyBURIDE  5 mg Oral BID WC  . [START ON 05/17/2017] potassium chloride SA  20 mEq Oral Daily  . quinapril  40 mg Oral Daily  . sodium chloride flush  3 mL Intravenous Q12H    Assessment: 76 yo male s/p cath with DES (plans for triple therapy for 3 months). He was on coumadin PTA for afib and pharmacy consulted to dose -INR= 1.33 (noted 1.9 on 10/25)  Home coumadin dose: 7.5mg /d except 5mg  on MW  Goal of Therapy:  INR 2-3 Monitor platelets by anticoagulation protocol: Yes   Plan:  -Coumadin 7.5mg  po today -Daily PT/INR  Hildred Laser, Pharm D 05/16/2017 2:12 PM

## 2017-05-16 NOTE — Interval H&P Note (Signed)
History and Physical Interval Note:  05/16/2017 9:25 AM  Oletha Blend  has presented today for surgery, with the diagnosis of chf-new diagnosis of cardiomyopathy the various methods of treatment have been discussed with the patient and family. After consideration of risks, benefits and other options for treatment, the patient has consented to  Procedure(s): RIGHT/LEFT HEART CATH AND CORONARY/GRAFT ANGIOGRAPHY (N/A) with possible PERCUTANEOUS CORONARY INTERVENTION as a surgical intervention .  The patient's history has been reviewed, patient examined, no change in status, stable for surgery.  I have reviewed the patient's chart and labs.  Questions were answered to the patient's satisfaction.    Cath Lab Visit (complete for each Cath Lab visit)  Clinical Evaluation Leading to the Procedure:   ACS: No.  Non-ACS:    Anginal Classification: No Symptoms - CHF Sx  Anti-ischemic medical therapy: Maximal Therapy (2 or more classes of medications)  Non-Invasive Test Results: High-risk stress test findings: cardiac mortality >3%/year -severely reduced EF on echo  Prior CABG: Previous CABG    Glenetta Hew

## 2017-05-16 NOTE — Progress Notes (Signed)
Site area: right brachial  Site Prior to Removal:  Level 0  Pressure Applied For 10 MINUTES    Minutes Beginning at 1415  Manual:   Yes.    Patient Status During Pull:  AAO X 4  Post Pull Groin Site:  Level 0  Post Pull Instructions Given:  Yes.    Post Pull Pulses Present:  Yes.    Dressing Applied:  Yes.    Comments:  Tolerated procedure well

## 2017-05-16 NOTE — Care Management Note (Signed)
Case Management Note  Patient Details  Name: Adrian Barrett MRN: [id number] Date of Birth: [birth year]  Subjective/Objective:    From home, s/p coronary stent intervention, will be on plavix.  Patient has PCP and medication coverage.              Action/Plan: NCM will follow for dc needs.   Expected Discharge Date:                  Expected Discharge Plan:  Home/Self Care  In-House Referral:     Discharge planning Services  CM Consult  Post Acute Care Choice:    Choice offered to:     DME Arranged:    DME Agency:     HH Arranged:    [place] Agency:     Status of Service:  Completed, signed off  If discussed at [employer] of Stay Meetings, dates discussed:    Additional Comments:  Zenon Mayo, RN 05/16/2017, 2:55 PM

## 2017-05-16 NOTE — Progress Notes (Signed)
TR BAND REMOVAL  LOCATION:    left radial   DEFLATED PER PROTOCOL:    Yes.    TIME BAND OFF / DRESSING APPLIED:    1600   SITE UPON ARRIVAL:    Level 0  SITE AFTER BAND REMOVAL:    Level 0  CIRCULATION SENSATION AND MOVEMENT:    Within Normal Limits   Yes.    COMMENTS:   Tolerated procedure well

## 2017-05-17 DIAGNOSIS — M109 Gout, unspecified: Secondary | ICD-10-CM | POA: Diagnosis not present

## 2017-05-17 DIAGNOSIS — I482 Chronic atrial fibrillation: Secondary | ICD-10-CM

## 2017-05-17 DIAGNOSIS — I255 Ischemic cardiomyopathy: Secondary | ICD-10-CM | POA: Diagnosis not present

## 2017-05-17 DIAGNOSIS — I25110 Atherosclerotic heart disease of native coronary artery with unstable angina pectoris: Secondary | ICD-10-CM | POA: Diagnosis not present

## 2017-05-17 DIAGNOSIS — I25710 Atherosclerosis of autologous vein coronary artery bypass graft(s) with unstable angina pectoris: Secondary | ICD-10-CM | POA: Diagnosis not present

## 2017-05-17 DIAGNOSIS — E785 Hyperlipidemia, unspecified: Secondary | ICD-10-CM | POA: Diagnosis not present

## 2017-05-17 DIAGNOSIS — I2582 Chronic total occlusion of coronary artery: Secondary | ICD-10-CM | POA: Diagnosis not present

## 2017-05-17 DIAGNOSIS — I5043 Acute on chronic combined systolic (congestive) and diastolic (congestive) heart failure: Secondary | ICD-10-CM | POA: Diagnosis not present

## 2017-05-17 DIAGNOSIS — Z6829 Body mass index (BMI) 29.0-29.9, adult: Secondary | ICD-10-CM | POA: Diagnosis not present

## 2017-05-17 DIAGNOSIS — I481 Persistent atrial fibrillation: Secondary | ICD-10-CM | POA: Diagnosis not present

## 2017-05-17 DIAGNOSIS — I6529 Occlusion and stenosis of unspecified carotid artery: Secondary | ICD-10-CM | POA: Diagnosis not present

## 2017-05-17 DIAGNOSIS — E669 Obesity, unspecified: Secondary | ICD-10-CM | POA: Diagnosis not present

## 2017-05-17 DIAGNOSIS — I110 Hypertensive heart disease with heart failure: Secondary | ICD-10-CM | POA: Diagnosis not present

## 2017-05-17 DIAGNOSIS — I2729 Other secondary pulmonary hypertension: Secondary | ICD-10-CM | POA: Diagnosis not present

## 2017-05-17 LAB — BASIC METABOLIC PANEL
Anion gap: 9 (ref 5–15)
BUN: 22 mg/dL — AB (ref 6–20)
CHLORIDE: 104 mmol/L (ref 101–111)
CO2: 26 mmol/L (ref 22–32)
CREATININE: 1.06 mg/dL (ref 0.61–1.24)
Calcium: 9.2 mg/dL (ref 8.9–10.3)
GFR calc Af Amer: >60 mL/min (ref >60)
GFR calc non Af Amer: >60 mL/min (ref >60)
GLUCOSE: 113 mg/dL — AB (ref 65–99)
POTASSIUM: 3.8 mmol/L (ref 3.5–5.1)
Sodium: 139 mmol/L (ref 135–145)

## 2017-05-17 LAB — CBC
HCT: 39.8 % (ref 39.0–52.0)
HEMOGLOBIN: 13.4 g/dL (ref 13.0–17.0)
MCH: 32.9 pg (ref 26.0–34.0)
MCHC: 33.7 g/dL (ref 30.0–36.0)
MCV: 97.8 fL (ref 78.0–100.0)
PLATELETS: 127 10*3/uL — AB (ref 150–400)
RBC: 4.07 MIL/uL — AB (ref 4.22–5.81)
RDW: 14.3 % (ref 11.5–15.5)
WBC: 9.4 10*3/uL (ref 4.0–10.5)

## 2017-05-17 LAB — PROTIME-INR
INR: 1.32
PROTHROMBIN TIME: 16.2 s — AB (ref 11.4–15.2)

## 2017-05-17 MED ORDER — CLOPIDOGREL BISULFATE 75 MG PO TABS: 75.0000 mg | ORAL_TABLET | Freq: Every day | ORAL | 11 refills | Status: DC

## 2017-05-17 MED ORDER — SPIRONOLACTONE 25 MG PO TABS: 12.5000 mg | ORAL_TABLET | Freq: Every day | ORAL | 3 refills | Status: DC

## 2017-05-17 MED ORDER — ATORVASTATIN CALCIUM 40 MG PO TABS: 40.0000 mg | ORAL_TABLET | Freq: Every day | ORAL | 6 refills | Status: DC

## 2017-05-17 MED ORDER — ASPIRIN 81 MG PO CHEW: 81.0000 mg | CHEWABLE_TABLET | Freq: Every day | ORAL | 0 refills | Status: DC

## 2017-05-17 NOTE — Progress Notes (Signed)
Progress Note  Patient Name: Adrian Barrett Date of Encounter: 05/17/2017  Primary Cardiologist: Dr. Percival Spanish  Subjective   Feeling well. No chest pain, sob or palpitations.   Inpatient Medications    Scheduled Meds: . allopurinol  300 mg Oral Daily  . amLODipine  5 mg Oral QPM  . atorvastatin  10 mg Oral Daily  . carvedilol  3.125 mg Oral BID  . clopidogrel  75 mg Oral Q breakfast  . furosemide  40 mg Intravenous BID  . glyBURIDE  5 mg Oral BID WC  . potassium chloride SA  20 mEq Oral Daily  . quinapril  40 mg Oral Daily  . sodium chloride flush  3 mL Intravenous Q12H  . Warfarin - Pharmacist Dosing Inpatient   Does not apply q1800   Continuous Infusions: . sodium chloride     PRN Meds: sodium chloride, acetaminophen, albuterol, HYDROcodone-acetaminophen, morphine injection, nitroGLYCERIN, ondansetron (ZOFRAN) IV, sodium chloride flush   Vital Signs    Vitals:   05/16/17 2213 05/17/17 0006 05/17/17 0335 05/17/17 0618  BP: 127/66 (!) 145/49 (!) 145/55 (!) 157/62  Pulse: (!) 47 (!) 49 (!) 54 (!) 59  Resp: 20 (!) 21 (!) 21   Temp:  98.6 F (37 C) 98 F (36.7 C) 98.1 F (36.7 C)  TempSrc:  Oral Oral Oral  SpO2: 95% 93% 95%   Weight:   200 lb 9.9 oz (91 kg)   Height:   5\' 10"  (1.778 m)     Intake/Output Summary (Last 24 hours) at 05/17/2017 0753 Last data filed at 05/17/2017 0600 Gross per 24 hour  Intake 830 ml  Output 3700 ml  Net -2870 ml   Filed Weights   05/16/17 0802 05/17/17 0335  Weight: 210 lb (95.3 kg) 200 lb 9.9 oz (91 kg)    Telemetry    afib rate in 40-50s - Personally Reviewed  ECG    Atrial fibrillation - Personally Reviewed  Physical Exam   GEN: No acute distress.   Neck: No JVD Cardiac: RRR, no murmurs, rubs, or gallops. L radial cath site stable Respiratory: Clear to auscultation bilaterally. GI: Soft, nontender, non-distended  MS: No edema; No deformity. Neuro:  Nonfocal  Psych: Normal affect   Labs     Chemistry Recent Labs  Lab 05/17/17 0342  NA 139  K 3.8  CL 104  CO2 26  GLUCOSE 113*  BUN 22*  CREATININE 1.06  CALCIUM 9.2  GFRNONAA >60  GFRAA >60  ANIONGAP 9     Hematology Recent Labs  Lab 05/17/17 0342  WBC 9.4  RBC 4.07*  HGB 13.4  HCT 39.8  MCV 97.8  MCH 32.9  MCHC 33.7  RDW 14.3  PLT 127*    Radiology    No results found.  Cardiac Studies   CORONARY STENT INTERVENTION  RIGHT/LEFT HEART CATH AND CORONARY/GRAFT ANGIOGRAPHY  Conclusion     LESION #1: Prox Cx lesion is 75% stenosed.  A drug-eluting stent was successfully placed using a STENT SIERRA 3.00 X 12 MM.  Post intervention, there is a 0% residual stenosis.  Using a BALLOON EMERGE MR 2.5X8.  LESION #2: Mid Cx (prox OM) lesion is 85% (focal) after AV Groove branch  A drug-eluting stent was successfully placed using a STENT SIERRA 3.00 X 08 MM.  Post intervention, there is a 5% residual stenosis.  Prox RCA to Dist RCA lesion is 100% stenosed. SVG-RPDA origin to Prox Graft lesion is 100% stenosed.  Prox LAD to  Mid LAD lesion is 100% stenosed.  LIMA-dLAD is widely patent with good runoff distally. -Very difficult to engage  SVG-OM & SVG-RI(Diag)Origin to Prox Graft lesion is 100% stenosed.  Hemodynamic findings consistent with severe pulmonary hypertension.  LV end diastolic pressure is moderately elevated.   Severe native vessel disease with occluded RCA and LAD.  SVG grafts to presumably the RPDA and OM as well as either ramus or diagonal branch are all occluded. LIMA-LAD is patent. Significant left to right collaterals via AV groove branch.   The circumflex has proximal and mid (lateral OM) of 75 and 85% respectively.  Both were treated with separate DES stents thereby avoiding potential jailing of the ostium of the AV groove circumflex -which provides collaterals to the RCA.  The patient has severe CHF and secondary pulmonary hypertension, and will likely require aggressive  diuresis.  Plan:  Overnight observation post procedure, but low threshold for admitting for short-term diuresis. --I have written for Lasix 40 mill grams IV twice daily starting this afternoon  Restart warfarin tonight along with aspirin and Plavix.  Continue DAPT for 3 months and then stop aspirin -continue Plavix plus warfarin  Dr. Percival Spanish started low-dose carvedilol.  We will continue for now, but he was bradycardic.   Diagnostic Diagram       Post-Intervention Diagram          Patient Profile     76 y.o. male with hx of CAD s/p CABG, HTN, DM, HLD  Carotid stenosis followed by Dr. Manuella Ghazi and atrial fibrillation on warfarin presented for cath.   Recently seen for increased lower extremity edema. Echo showed newly depressed LVEF of 25-30% with diffuse hypokinesis. Treated with increase lasix. Reduce Norvac. Started on Coreg 3.125mg  BID. Recommended R & L cath.   Assessment & Plan    1. CAD - Cath showed patent LIMA to LAD. SVG grafts to presumably the RPDA and OM as well as either ramus or diagonal branch are all occluded. The circumflex has proximal and mid (lateral OM) of 75 and 85% respectively.  Both were treated with separate DES stents thereby avoiding potential jailing of the ostium of the AV groove circumflex -which provides collaterals to the RCA. - Continue DAPT with ASA and Plavix for 3 months. Then stop ASA and continue Plavix and coumadin.   2. Chronic systolic CHF - moderately elevated LVEDP and severe pHTN. Started on IV diuresis. Net I & O negative 2.8L. Patient on Lasix 40mg  qd at home. Here Iv lasix 40mg  BID. MD to decide diuretics dose at discharge.  - Continue BB and ACE. Dr. Percival Spanish recomended discontinuation of Norvasc and up titration of Coreg however rate in low 50s and intermittently dropping to 40s.   3. Persistent Atrial fibrillation - Resumed coumadin.   4. HTN - minimally elevated  For questions or updates, please contact [place] Please consult [email or web address] for contact info under Cardiology/STEMI.      Jarrett Soho, PA  05/17/2017, 7:53 AM    Patient seen and examined. Agree with assessment and plan. Feels better; no chest pain. Breathing better with good diuresis post iv lasix; I/O -2600.  Will add spironolactone 12.5 mg daily with HTN and reduced EF. Continue amlodipine for now. HR 56 - 60, will continue present low dose carvedilol. DC today   Troy Sine, MD, [facility] 05/17/2017 9:12 AM

## 2017-05-17 NOTE — Progress Notes (Signed)
CARDIAC REHAB PHASE I   PRE:  Rate/Rhythm: 51 afib with PVCs    BP: sitting 152/49    SaO2: 94 RA  MODE:  Ambulation: 400 ft   POST:  Rate/Rhythm: 85 afib    BP: sitting 167/52     SaO2: 90-91 RA  Tolerated well, thankful to feel better. Ed completed with pt and wife. Gave HF booklet and reviewed as well. Understands importance of Plavix/Coumadin. Will refer to [place]  [id number]   [place], ACSM 05/17/2017 9:17 AM

## 2017-05-17 NOTE — Discharge Summary (Signed)
Discharge Summary    Patient ID: Adrian Barrett,  MRN: [id number], DOB/AGE: 14-Aug-1940 76 y.o.  Admit date: 05/16/2017 Discharge date: 05/17/2017  Primary Care Provider: Monico Blitz Primary Cardiologist: Dr. Percival Spanish   Discharge Diagnoses    Principal Problem:   Cardiomyopathy, unspecified ([place]) Active Problems:   MITRAL VALVE INSUFF&AORTIC VALVE INSUFF   ATRIAL FIBRILLATION   Essential hypertension   Acute combined systolic and diastolic heart failure ([place])   Coronary artery disease involving native coronary artery of native heart with unstable angina pectoris ([place])   HLD with LDL goal of less than 70s  Allergies Allergies  Allergen Reactions  . Augmentin [Amoxicillin-Pot Clavulanate] Other (See Comments)    Unknown Has patient had a PCN reaction causing immediate rash, facial/tongue/throat swelling, SOB or lightheadedness with hypotension: Unknown Has patient had a PCN reaction causing severe rash involving mucus membranes or skin necrosis: Unknown Has patient had a PCN reaction that required hospitalization: Unknown Has patient had a PCN reaction occurring within the last 10 years: No If all of the above answers are "NO", then may proceed with Cephalosporin use.     Diagnostic Studies/Procedures    CORONARY STENT INTERVENTION  RIGHT/LEFT HEART CATH AND CORONARY/GRAFT ANGIOGRAPHY  Conclusion     LESION #1: Prox Cx lesion is 75% stenosed.  A drug-eluting stent was successfully placed using a STENT SIERRA 3.00 X 12 MM.  Post intervention, there is a 0% residual stenosis.  Using a BALLOON EMERGE MR 2.5X8.  LESION #2: Mid Cx (prox OM) lesion is 85% (focal) after AV Groove branch  A drug-eluting stent was successfully placed using a STENT SIERRA 3.00 X 08 MM.  Post intervention, there is a 5% residual stenosis.  Prox RCA to Dist RCA lesion is 100% stenosed. SVG-RPDA origin to Prox Graft lesion is 100% stenosed.  Prox LAD to Mid LAD lesion is 100%  stenosed.  LIMA-dLAD is widely patent with good runoff distally. -Very difficult to engage  SVG-OM & SVG-RI(Diag)Origin to Prox Graft lesion is 100% stenosed.  Hemodynamic findings consistent with severe pulmonary hypertension.  LV end diastolic pressure is moderately elevated.  Severe native vessel disease with occluded RCA and LAD. SVG grafts to presumably the RPDA and OM as well as either ramus or diagonal branch are all occluded. LIMA-LAD is patent. Significant left to right collaterals via AV groove branch.  The circumflex has proximal and mid (lateral OM) of 75 and 85% respectively. Both were treated with separate DES stents thereby avoiding potential jailing of the ostium of the AV groove circumflex -which provides collaterals to the RCA.  The patient has severe CHF and secondary pulmonary hypertension, and will likely require aggressive diuresis.  Plan:  Overnight observation post procedure, but low threshold for admitting for short-term diuresis. --I have written for Lasix 40 mill grams IV twice daily starting this afternoon  Restart warfarin tonight along with aspirin and Plavix.  Continue DAPT for 3 months and then stop aspirin -continue Plavix plus warfarin  Dr. Percival Spanish started low-dose carvedilol. We will continue for now, but he was bradycardic.   Diagnostic Diagram       Post-Intervention Diagram            History of Present Illness     76 y.o. male with hx of CAD s/p CABG, HTN, DM, HLD  Carotid stenosis followed by Dr. Manuella Ghazi and atrial fibrillation on warfarin presented for cath.   Recently seen for increased lower extremity edema. Echo showed newly depressed  LVEF of 25-30% with diffuse hypokinesis. Treated with increase lasix. Reduce Norvac. Started on Coreg 3.125mg  BID. Recommended R & L cath.     Hospital Course     Consultants: None  1. CAD - Cath showed patent LIMA to LAD. SVG grafts to presumably the RPDA and OM as well as either  ramus or diagonal branch are all occluded. The circumflex has proximal and mid (lateral OM) of 75 and 85% respectively. Both were treated with separate DES stents thereby avoiding potential jailing of the ostium of the AV groove circumflex -which provides collaterals to the RCA. No recurrent chest pain. Cath site stable.  - Continue DAPT with ASA and Plavix for 3 months. Then stop ASA and continue Plavix and coumadin.   2. Chronic systolic CHF - Moderately elevated LVEDP and severe pHTN by cath. Started on IV diuresis. Net I & O negative 2.8L. - Continue BB and ACE. Unable to up titration of Coreg due to rate in low 50s and intermittently dropping to 40s. Will add spironolactone 12.5 mg daily with HTN and reduced EF.   3. Persistent Atrial fibrillation - Resumed coumadin. Will give 7.5mg  tonight and then home dose. INR check  @ PCP office this Friday.   4. HTN - Minimally elevated. Meds changes as above. Continue norvasc at current dose.   5. HLD - No results found for requested labs within last 8760 hours.  - Increased lipitor to 40mg  qd  The patient has been seen by Dr. Claiborne Billings  today and deemed ready for discharge home. All follow-up appointments have been scheduled. Discharge medications are listed below.   Discharge Vitals Blood pressure (!) 151/52, pulse (!) 54, temperature 98 F (36.7 C), temperature source Oral, resp. rate (!) 23, height 5\' 10"  (1.778 m), weight 200 lb 9.9 oz (91 kg), SpO2 93 %.  Filed Weights   05/16/17 0802 05/17/17 0335  Weight: 210 lb (95.3 kg) 200 lb 9.9 oz (91 kg)    Labs & Radiologic Studies     CBC Recent Labs    05/17/17 0342  WBC 9.4  HGB 13.4  HCT 39.8  MCV 97.8  PLT 614*   Basic Metabolic Panel Recent Labs    05/17/17 0342  NA 139  K 3.8  CL 104  CO2 26  GLUCOSE 113*  BUN 22*  CREATININE 1.06  CALCIUM 9.2    No results found.  Disposition   Pt is being discharged home today in good condition.  Follow-up Plans &  Appointments    Follow-up Information    Erlene Quan, [place]. Go on 05/27/2017.   Specialties:  Cardiology, Radiology Why:  @10 :30 am for post hospital  Contact information: [place] [address] [phone]        Monico Blitz, MD. Schedule an appointment as soon as possible for a visit on 05/20/2017.   Specialty:  Internal Medicine Why:  For INR check Contact information: [place] [address] [phone]          Discharge Instructions    Amb Referral to Cardiac Rehabilitation   Complete by:  As directed    Diagnosis:   Coronary Stents PTCA     Diet - low sodium heart healthy   Complete by:  As directed    Discharge instructions   Complete by:  As directed    No driving for 48 hours. No lifting over 5 lbs for 1 week. No sexual activity for 1 week.  You may return to work on 05/23/17. Keep procedure site clean & dry. If you notice increased pain, swelling, bleeding or pus, call/return!  You may shower, but no soaking baths/hot tubs/pools for 1 week.  Take Warfarin 7.5mg  tonight and then resume daily dose. INR check Friday  Hold metfromin today. Resume tomorrow evening   Increase activity slowly   Complete by:  As directed       Discharge Medications   Current Discharge Medication List    START taking these medications   Details  clopidogrel (PLAVIX) 75 MG tablet Take 1 tablet (75 mg total) daily with breakfast by mouth. Qty: 30 tablet, Refills: 11    spironolactone (ALDACTONE) 25 MG tablet Take 0.5 tablets (12.5 mg total) daily by mouth. Qty: 30 tablet, Refills: 3      CONTINUE these medications which have CHANGED   Details  aspirin 81 MG chewable tablet Chew 1 tablet (81 mg total) daily by mouth. Qty: 1 tablet, Refills: 0    atorvastatin (LIPITOR) 40 MG tablet Take 1 tablet (40 mg total) daily by mouth. Qty: 30 tablet, Refills: 6      CONTINUE these medications which have NOT CHANGED   Details  albuterol  (PROVENTIL HFA;VENTOLIN HFA) 108 (90 Base) MCG/ACT inhaler Inhale 2 puffs into the lungs every 6 (six) hours as needed for wheezing or shortness of breath.    allopurinol (ZYLOPRIM) 300 MG tablet Take 300 mg by mouth daily.      amLODipine (NORVASC) 5 MG tablet Take 5 mg by mouth every evening.     carvedilol (COREG) 3.125 MG tablet Take 1 tablet (3.125 mg total) by mouth 2 (two) times daily. Qty: 180 tablet, Refills: 3    furosemide (LASIX) 40 MG tablet Take 1 tablet (40 mg total) by mouth daily.    glyBURIDE (DIABETA) 5 MG tablet Take 5 mg by mouth 2 (two) times daily with a meal.      HYDROcodone-acetaminophen (NORCO/VICODIN) 5-325 MG tablet One tablet every four hours as needed for acute pain.  Limit of five days per [place] statue. Qty: 30 tablet, Refills: 0    Ibuprofen-Diphenhydramine Cit (ADVIL PM PO) Take 1 tablet by mouth daily as needed (sleep).     metFORMIN (GLUCOPHAGE) 500 MG tablet Take 500 mg by mouth 2 (two) times daily with a meal.     Omega-3 Fatty Acids (FISH OIL) 1000 MG CAPS Take 1,000 mg by mouth daily.     pioglitazone (ACTOS) 45 MG tablet Take 45 mg by mouth daily.     quinapril (ACCUPRIL) 40 MG tablet Take 40 mg by mouth daily.     warfarin (COUMADIN) 5 MG tablet Take 5-7.5 mg by mouth daily at 6 PM. Takes 5 mg on Monday, Wednesday and Friday.  All other days takes 7.5 mg    nitroGLYCERIN (NITROSTAT) 0.4 MG SL tablet Place 1 tablet (0.4 mg total) under the tongue every 5 (five) minutes x 3 doses as needed for chest pain. If no relief after the 3rd dose, proceed to the ED for an evaluation Qty: 25 tablet, Refills: 3      STOP taking these medications     potassium chloride SA (KLOR-CON M20) 20 MEQ tablet           Outstanding Labs/Studies   INR check Friday BMET during follow up in clinic LFT and Lipid panel in 4 weeks  Duration of Discharge Encounter   Greater than 30 minutes including physician time.  Signed, [employer]  PA-C 05/17/2017, 10:30 AM

## 2017-05-20 DIAGNOSIS — I4891 Unspecified atrial fibrillation: Secondary | ICD-10-CM | POA: Diagnosis not present

## 2017-05-20 DIAGNOSIS — I2510 Atherosclerotic heart disease of native coronary artery without angina pectoris: Secondary | ICD-10-CM | POA: Diagnosis not present

## 2017-05-20 DIAGNOSIS — Z299 Encounter for prophylactic measures, unspecified: Secondary | ICD-10-CM | POA: Diagnosis not present

## 2017-05-20 DIAGNOSIS — Z6833 Body mass index (BMI) 33.0-33.9, adult: Secondary | ICD-10-CM | POA: Diagnosis not present

## 2017-05-20 DIAGNOSIS — G8929 Other chronic pain: Secondary | ICD-10-CM | POA: Diagnosis not present

## 2017-05-20 DIAGNOSIS — E1165 Type 2 diabetes mellitus with hyperglycemia: Secondary | ICD-10-CM | POA: Diagnosis not present

## 2017-05-20 DIAGNOSIS — J449 Chronic obstructive pulmonary disease, unspecified: Secondary | ICD-10-CM | POA: Diagnosis not present

## 2017-05-27 ENCOUNTER — Ambulatory Visit (INDEPENDENT_AMBULATORY_CARE_PROVIDER_SITE_OTHER): Payer: Medicare Other | Admitting: Cardiology

## 2017-05-27 ENCOUNTER — Encounter: Payer: Self-pay | Admitting: Cardiology

## 2017-05-27 VITALS — BP 150/69 | HR 48 | Ht 70.5 in | Wt 204.6 lb

## 2017-05-27 DIAGNOSIS — I5041 Acute combined systolic (congestive) and diastolic (congestive) heart failure: Secondary | ICD-10-CM

## 2017-05-27 DIAGNOSIS — I2510 Atherosclerotic heart disease of native coronary artery without angina pectoris: Secondary | ICD-10-CM | POA: Diagnosis not present

## 2017-05-27 DIAGNOSIS — I482 Chronic atrial fibrillation, unspecified: Secondary | ICD-10-CM

## 2017-05-27 DIAGNOSIS — Z9861 Coronary angioplasty status: Secondary | ICD-10-CM

## 2017-05-27 DIAGNOSIS — Z7901 Long term (current) use of anticoagulants: Secondary | ICD-10-CM | POA: Diagnosis not present

## 2017-05-27 DIAGNOSIS — I255 Ischemic cardiomyopathy: Secondary | ICD-10-CM

## 2017-05-27 DIAGNOSIS — I10 Essential (primary) hypertension: Secondary | ICD-10-CM

## 2017-05-27 DIAGNOSIS — E119 Type 2 diabetes mellitus without complications: Secondary | ICD-10-CM | POA: Diagnosis not present

## 2017-05-27 DIAGNOSIS — Z951 Presence of aortocoronary bypass graft: Secondary | ICD-10-CM

## 2017-05-27 MED ORDER — LOSARTAN POTASSIUM 50 MG PO TABS: 50.0000 mg | ORAL_TABLET | Freq: Every day | ORAL | 3 refills | Status: DC

## 2017-05-27 NOTE — Assessment & Plan Note (Signed)
Pt seen as an OP in Oct 2018 with CHF. Echo showed new LVD

## 2017-05-27 NOTE — Assessment & Plan Note (Signed)
EF 25-30% by echo Oct 2018

## 2017-05-27 NOTE — Assessment & Plan Note (Signed)
Controlled.  

## 2017-05-27 NOTE — Assessment & Plan Note (Addendum)
CHADs VASc=4, on Coumadin Stop ASA after 3 months, then continue Plavix and Coumadin

## 2017-05-27 NOTE — Assessment & Plan Note (Signed)
On Glucophage 

## 2017-05-27 NOTE — Patient Instructions (Addendum)
Medication Instructions:  1. STOP ACCUPRIL  2. START LOSARTAN 50 MG DAILY; RX HAS BEEN SENT IN  3. STOP ASPIRIN IN MONTHS WHICH WILL BE 08/17/17 THIS WILL BE 3 MONTHS FROM WHEN YOUR STENT WAS PLACED   Labwork: NONE ORDERED TODAY  Testing/Procedures: 1. Your physician has requested that you have an echocardiogram. Echocardiography is a painless test that uses sound waves to create images of your heart. It provides your doctor with information about the size and shape of your heart and how well your heart's chambers and valves are working. This procedure takes approximately one hour. There are no restrictions for this procedure. THIS CAN BE DONE SOMETIME IN FEB 2019    Follow-Up: DR. Percival Spanish A FEW DAYS AFTER ECHO HAS BEEN COMPLETED  Any Other Special Instructions Will Be Listed Below (If Applicable). MONITOR WEIGHT DAILY; CALL IF YOUR WEIGHT IS UP 5 LB'S IN 1 WEEK  MONITOR BLOOD PRESSURE AND CALL IF YOUR BLOOD PRESSURE CONTINUES TO BE ELEVATED, IF YOUR SYSTOLIC (TOP NUMBER) IS CONSISTENTLY 150 OR HIGHER OR ID DIASTOLIC (BOTTOM NUMBER) IS CONSISTENTLY 85 OR HIGHER.  FOLLOW A LOW SALT DIET; SEE BELOW  If you need a refill on your cardiac medications before your next appointment, please call your pharmacy.   Low-Sodium Eating Plan Sodium, which is an element that makes up salt, helps you maintain a healthy balance of fluids in your body. Too much sodium can increase your blood pressure and cause fluid and waste to be held in your body. Your health care provider or dietitian may recommend following this plan if you have high blood pressure (hypertension), kidney disease, liver disease, or heart failure. Eating less sodium can help lower your blood pressure, reduce swelling, and protect your heart, liver, and kidneys. What are tips for following this plan? General guidelines  Most people on this plan should limit their sodium intake to 1,500-2,000 mg (milligrams) of sodium each  day. Reading food labels  The Nutrition Facts label lists the amount of sodium in one serving of the food. If you eat more than one serving, you must multiply the listed amount of sodium by the number of servings.  Choose foods with less than 140 mg of sodium per serving.  Avoid foods with 300 mg of sodium or more per serving. Shopping  Look for lower-sodium products, often labeled as "low-sodium" or "no salt added."  Always check the sodium content even if foods are labeled as "unsalted" or "no salt added".  Buy fresh foods. ? Avoid canned foods and premade or frozen meals. ? Avoid canned, cured, or processed meats  Buy breads that have less than 80 mg of sodium per slice. Cooking  Eat more home-cooked food and less restaurant, buffet, and fast food.  Avoid adding salt when cooking. Use salt-free seasonings or herbs instead of table salt or sea salt. Check with your health care provider or pharmacist before using salt substitutes.  Cook with plant-based oils, such as canola, sunflower, or olive oil. Meal planning  When eating at a restaurant, ask that your food be prepared with less salt or no salt, if possible.  Avoid foods that contain MSG (monosodium glutamate). MSG is sometimes added to [place] food, bouillon, and some canned foods. What foods are recommended? The items listed may not be a complete list. Talk with your dietitian about what dietary choices are best for you. Grains Low-sodium cereals, including oats, puffed wheat and rice, and shredded wheat. Low-sodium crackers. Unsalted rice. Unsalted pasta. Low-sodium  bread. Whole-grain breads and whole-grain pasta. Vegetables Fresh or frozen vegetables. "No salt added" canned vegetables. "No salt added" tomato sauce and paste. Low-sodium or reduced-sodium tomato and vegetable juice. Fruits Fresh, frozen, or canned fruit. Fruit juice. Meats and other protein foods Fresh or frozen (no salt added) meat, poultry, seafood,  and fish. Low-sodium canned tuna and salmon. Unsalted nuts. Dried peas, beans, and lentils without added salt. Unsalted canned beans. Eggs. Unsalted nut butters. Dairy Milk. Soy milk. Cheese that is naturally low in sodium, such as ricotta cheese, fresh mozzarella, or Swiss cheese Low-sodium or reduced-sodium cheese. Cream cheese. Yogurt. Fats and oils Unsalted butter. Unsalted margarine with no trans fat. Vegetable oils such as canola or olive oils. Seasonings and other foods Fresh and dried herbs and spices. Salt-free seasonings. Low-sodium mustard and ketchup. Sodium-free salad dressing. Sodium-free light mayonnaise. Fresh or refrigerated horseradish. Lemon juice. Vinegar. Homemade, reduced-sodium, or low-sodium soups. Unsalted popcorn and pretzels. Low-salt or salt-free chips. What foods are not recommended? The items listed may not be a complete list. Talk with your dietitian about what dietary choices are best for you. Grains Instant hot cereals. Bread stuffing, pancake, and biscuit mixes. Croutons. Seasoned rice or pasta mixes. Noodle soup cups. Boxed or frozen macaroni and cheese. Regular salted crackers. Self-rising flour. Vegetables Sauerkraut, pickled vegetables, and relishes. Olives. [place] fries. Onion rings. Regular canned vegetables (not low-sodium or reduced-sodium). Regular canned tomato sauce and paste (not low-sodium or reduced-sodium). Regular tomato and vegetable juice (not low-sodium or reduced-sodium). Frozen vegetables in sauces. Meats and other protein foods Meat or fish that is salted, canned, smoked, spiced, or pickled. Bacon, ham, sausage, hotdogs, corned beef, chipped beef, packaged lunch meats, salt pork, jerky, pickled herring, anchovies, regular canned tuna, sardines, salted nuts. Dairy Processed cheese and cheese spreads. Cheese curds. Blue cheese. Feta cheese. String cheese. Regular cottage cheese. Buttermilk. Canned milk. Fats and oils Salted butter. Regular  margarine. Ghee. Bacon fat. Seasonings and other foods Onion salt, garlic salt, seasoned salt, table salt, and sea salt. Canned and packaged gravies. Worcestershire sauce. Tartar sauce. Barbecue sauce. Teriyaki sauce. Soy sauce, including reduced-sodium. Steak sauce. Fish sauce. Oyster sauce. Cocktail sauce. Horseradish that you find on the shelf. Regular ketchup and mustard. Meat flavorings and tenderizers. Bouillon cubes. Hot sauce and Tabasco sauce. Premade or packaged marinades. Premade or packaged taco seasonings. Relishes. Regular salad dressings. Salsa. Potato and tortilla chips. Corn chips and puffs. Salted popcorn and pretzels. Canned or dried soups. Pizza. Frozen entrees and pot pies. Summary  Eating less sodium can help lower your blood pressure, reduce swelling, and protect your heart, liver, and kidneys.  Most people on this plan should limit their sodium intake to 1,500-2,000 mg (milligrams) of sodium each day.  Canned, boxed, and frozen foods are high in sodium. Restaurant foods, fast foods, and pizza are also very high in sodium. You also get sodium by adding salt to food.  Try to cook at home, eat more fresh fruits and vegetables, and eat less fast food, canned, processed, or prepared foods. This information is not intended to replace advice given to you by your health care provider. Make sure you discuss any questions you have with your health care provider. Document Released: 12/18/2001 Document Revised: 06/21/2016 Document Reviewed: 06/21/2016 Elsevier Interactive Patient Education  2017 [employer].

## 2017-05-27 NOTE — Assessment & Plan Note (Signed)
CABG x 4 in 1999

## 2017-05-27 NOTE — Progress Notes (Signed)
05/27/2017 Adrian Barrett   1941-03-22  [id number]  Primary Physician Monico Blitz, MD Primary Cardiologist: Dr Percival Spanish  HPI:  76 y/o male with a history of remote CABG, CAF, HTN, DM, and HLD. He saw Dr Percival Spanish in Oct and was felt to have acute CHF. An echo showed new LVD with an EF of 25-30%. He was diuresed as an OP, Coumadin held, and admitted for Rt and Lt cath 05/16/17. Cath revealed occluded SVGs and a patent LIMA-LAD. He underwent PCI with DES to his native OM and CFX. His LVED was also elevated and he was diuresed before discharge. He is in the office today or follow up. He has done well since discharge, no chest pain, orthopnea, edema, or unusual dyspnea. He does have a dry cough, no fever or chills.    Current Outpatient Medications  Medication Sig Dispense Refill  . albuterol (PROVENTIL HFA;VENTOLIN HFA) 108 (90 Base) MCG/ACT inhaler Inhale 2 puffs into the lungs every 6 (six) hours as needed for wheezing or shortness of breath.    . allopurinol (ZYLOPRIM) 300 MG tablet Take 300 mg by mouth daily.      Marland Kitchen amLODipine (NORVASC) 5 MG tablet Take 5 mg by mouth every evening.     Marland Kitchen aspirin 81 MG chewable tablet Chew 1 tablet (81 mg total) daily by mouth. 1 tablet 0  . atorvastatin (LIPITOR) 40 MG tablet Take 1 tablet (40 mg total) daily by mouth. 30 tablet 6  . carvedilol (COREG) 3.125 MG tablet Take 1 tablet (3.125 mg total) by mouth 2 (two) times daily. 180 tablet 3  . clopidogrel (PLAVIX) 75 MG tablet Take 1 tablet (75 mg total) daily with breakfast by mouth. 30 tablet 11  . furosemide (LASIX) 40 MG tablet Take 1 tablet (40 mg total) by mouth daily.    Marland Kitchen glyBURIDE (DIABETA) 5 MG tablet Take 5 mg by mouth 2 (two) times daily with a meal.      . HYDROcodone-acetaminophen (NORCO/VICODIN) 5-325 MG tablet One tablet every four hours as needed for acute pain.  Limit of five days per [place] statue. (Patient taking differently: Take 1 tablet by mouth daily as needed. One tablet every  four hours as needed for acute pain.  Limit of five days per [place] statue.) 30 tablet 0  . Ibuprofen-Diphenhydramine Cit (ADVIL PM PO) Take 1 tablet by mouth daily as needed (sleep).     . metFORMIN (GLUCOPHAGE) 500 MG tablet Take 500 mg by mouth 2 (two) times daily with a meal.     . Omega-3 Fatty Acids (FISH OIL) 1000 MG CAPS Take 1,000 mg by mouth daily.     . pioglitazone (ACTOS) 45 MG tablet Take 45 mg by mouth daily.     Marland Kitchen spironolactone (ALDACTONE) 25 MG tablet Take 0.5 tablets (12.5 mg total) daily by mouth. 30 tablet 3  . warfarin (COUMADIN) 5 MG tablet Take 5-7.5 mg by mouth daily at 6 PM. Takes 5 mg on Monday, Wednesday and Friday.  All other days takes 7.5 mg    . losartan (COZAAR) 50 MG tablet Take 1 tablet (50 mg total) daily by mouth. 90 tablet 3  . nitroGLYCERIN (NITROSTAT) 0.4 MG SL tablet Place 1 tablet (0.4 mg total) under the tongue every 5 (five) minutes x 3 doses as needed for chest pain. If no relief after the 3rd dose, proceed to the ED for an evaluation 25 tablet 3   No current facility-administered medications for this visit.  Allergies  Allergen Reactions  . Augmentin [Amoxicillin-Pot Clavulanate] Other (See Comments)    Unknown Has patient had a PCN reaction causing immediate rash, facial/tongue/throat swelling, SOB or lightheadedness with hypotension: Unknown Has patient had a PCN reaction causing severe rash involving mucus membranes or skin necrosis: Unknown Has patient had a PCN reaction that required hospitalization: Unknown Has patient had a PCN reaction occurring within the last 10 years: No If all of the above answers are "NO", then may proceed with Cephalosporin use.     Past Medical History:  Diagnosis Date  . Arthritis    "neck, lower back" (05/16/2017)  . Atrial fibrillation ([place])   . Atrial flutter ([place])   . Congestive heart failure, unspecified    45 - 50%  . COPD (chronic obstructive pulmonary disease) ([place])   . Coronary  atherosclerosis of native coronary artery   . Gout    History of   . Gout    "on daily RX:" (05/16/2017)  . History of ETOH abuse    Quit 20 years ago.   Marland Kitchen Hyperlipidemia   . Mitral valve insufficiency    Mild to mod on echo 2015  . Myocardial infarction ([place]) 1987  . Obesity   . Pneumonia    "twice; not been in the last several years" (05/16/2017)  . Stroke [facility]) 01/2000   right posterior middle cerebral artery cerebrovascular accident/notes 51/11/2010; denies residual on 05/16/2017  . Type II diabetes mellitus ([place])   . Unspecified essential hypertension     Social History   Socioeconomic History  . Marital status: Married    Spouse name: Not on file  . Number of children: Not on file  . Years of education: Not on file  . Highest education level: Not on file  Social Needs  . Financial resource strain: Not on file  . Food insecurity - worry: Not on file  . Food insecurity - inability: Not on file  . Transportation needs - medical: Not on file  . Transportation needs - non-medical: Not on file  Occupational History  . Occupation: DISABLED  Tobacco Use  . Smoking status: Former Smoker    Packs/day: 2.00    Years: 32.00    Pack years: 64.00    Types: Cigarettes    Last attempt to quit: 07/12/1985    Years since quitting: 31.8  . Smokeless tobacco: Former Systems developer    Types: [place] date: 07/12/1986  Substance and Sexual Activity  . Alcohol use: No    Frequency: Never    Comment: [place] EtOH; "stopped in the 1990s"  . Drug use: No  . Sexual activity: No  Other Topics Concern  . Not on file  Social History Narrative   DISABLED FROM [employer] AFTER A STROKE IN 2001           Family History  Problem Relation Age of Onset  . Heart attack Father      Review of Systems: General: negative for chills, fever, night sweats or weight changes.  Cardiovascular: negative for chest pain, dyspnea on exertion, edema, orthopnea, palpitations, paroxysmal nocturnal  dyspnea or shortness of breath Dermatological: negative for rash Respiratory: negative for cough or wheezing Urologic: negative for hematuria Abdominal: negative for nausea, vomiting, diarrhea, bright red blood per rectum, melena, or hematemesis Neurologic: negative for visual changes, syncope, or dizziness All other systems reviewed and are otherwise negative except as noted above.    Blood pressure (!) 150/69, pulse (!) 48, height  5' 10.5" (1.791 m), weight 204 lb 9.6 oz (92.8 kg).  General appearance: alert, cooperative, no distress and mildly obese Lungs: few basilar crackles Heart: irregularly irregular rhythm Extremities: no edema Skin: Skin color, texture, turgor normal. No rashes or lesions Neurologic: Grossly normal  EKG AF with VR 48  ASSESSMENT AND PLAN:   Acute combined systolic and diastolic heart failure (HCC) Pt seen as an OP in Oct 2018 with CHF. Echo showed new LVD  Hx of CABG CABG x 4 in 1999  CAD S/P percutaneous coronary angioplasty Native CFX and OM PCI with DES 05/16/17 LIMA-LAD patent, SVGs occluded  Ischemic cardiomyopathy EF 25-30% by echo Oct 2018  Essential hypertension Controlled  Non-insulin dependent type 2 diabetes mellitus (HCC) On Glucophage  Chronic atrial fibrillation (HCC) Slow rates limit beta blocker titration  Chronic anticoagulation CHADs VASc=4, on Coumadin Stop ASA after 3 months, then continue Plavix and Coumadin   PLAN  Stop ASA after 3 months. Check echo in 3 months, if his EF is still depressed consider ICD and Entresto. Since he has a dry cough I'll go ahead and transition him now to an ARB in preparation for Entresto.  He'll F/U with Dr Percival Spanish after the echo. He knows to watch his salt intake and his wgt.   Kerin Ransom PA-C 05/27/2017 11:17 AM

## 2017-05-27 NOTE — Assessment & Plan Note (Signed)
Slow rates limit beta blocker titration

## 2017-05-27 NOTE — Assessment & Plan Note (Signed)
Native CFX and OM PCI with DES 05/16/17 LIMA-LAD patent, SVGs occluded

## 2017-06-03 DIAGNOSIS — I10 Essential (primary) hypertension: Secondary | ICD-10-CM | POA: Diagnosis not present

## 2017-06-03 DIAGNOSIS — Z87891 Personal history of nicotine dependence: Secondary | ICD-10-CM | POA: Diagnosis not present

## 2017-06-03 DIAGNOSIS — I4891 Unspecified atrial fibrillation: Secondary | ICD-10-CM | POA: Diagnosis not present

## 2017-06-03 DIAGNOSIS — Z299 Encounter for prophylactic measures, unspecified: Secondary | ICD-10-CM | POA: Diagnosis not present

## 2017-06-03 DIAGNOSIS — Z6830 Body mass index (BMI) 30.0-30.9, adult: Secondary | ICD-10-CM | POA: Diagnosis not present

## 2017-06-03 DIAGNOSIS — R05 Cough: Secondary | ICD-10-CM | POA: Diagnosis not present

## 2017-06-30 DIAGNOSIS — Z87891 Personal history of nicotine dependence: Secondary | ICD-10-CM | POA: Diagnosis not present

## 2017-06-30 DIAGNOSIS — I4891 Unspecified atrial fibrillation: Secondary | ICD-10-CM | POA: Diagnosis not present

## 2017-06-30 DIAGNOSIS — I4892 Unspecified atrial flutter: Secondary | ICD-10-CM | POA: Diagnosis not present

## 2017-06-30 DIAGNOSIS — Z6831 Body mass index (BMI) 31.0-31.9, adult: Secondary | ICD-10-CM | POA: Diagnosis not present

## 2017-06-30 DIAGNOSIS — J329 Chronic sinusitis, unspecified: Secondary | ICD-10-CM | POA: Diagnosis not present

## 2017-06-30 DIAGNOSIS — Z299 Encounter for prophylactic measures, unspecified: Secondary | ICD-10-CM | POA: Diagnosis not present

## 2017-07-07 DIAGNOSIS — Z299 Encounter for prophylactic measures, unspecified: Secondary | ICD-10-CM | POA: Diagnosis not present

## 2017-07-07 DIAGNOSIS — Z6830 Body mass index (BMI) 30.0-30.9, adult: Secondary | ICD-10-CM | POA: Diagnosis not present

## 2017-07-07 DIAGNOSIS — I4892 Unspecified atrial flutter: Secondary | ICD-10-CM | POA: Diagnosis not present

## 2017-07-07 DIAGNOSIS — I10 Essential (primary) hypertension: Secondary | ICD-10-CM | POA: Diagnosis not present

## 2017-07-07 DIAGNOSIS — E7800 Pure hypercholesterolemia, unspecified: Secondary | ICD-10-CM | POA: Diagnosis not present

## 2017-07-07 DIAGNOSIS — I4891 Unspecified atrial fibrillation: Secondary | ICD-10-CM | POA: Diagnosis not present

## 2017-07-07 DIAGNOSIS — E1165 Type 2 diabetes mellitus with hyperglycemia: Secondary | ICD-10-CM | POA: Diagnosis not present

## 2017-07-08 DIAGNOSIS — I10 Essential (primary) hypertension: Secondary | ICD-10-CM | POA: Diagnosis not present

## 2017-07-08 DIAGNOSIS — Z1331 Encounter for screening for depression: Secondary | ICD-10-CM | POA: Diagnosis not present

## 2017-07-08 DIAGNOSIS — Z0000 Encounter for general adult medical examination without abnormal findings: Secondary | ICD-10-CM | POA: Diagnosis not present

## 2017-07-08 DIAGNOSIS — Z1339 Encounter for screening examination for other mental health and behavioral disorders: Secondary | ICD-10-CM | POA: Diagnosis not present

## 2017-07-08 DIAGNOSIS — E7800 Pure hypercholesterolemia, unspecified: Secondary | ICD-10-CM | POA: Diagnosis not present

## 2017-07-08 DIAGNOSIS — Z6830 Body mass index (BMI) 30.0-30.9, adult: Secondary | ICD-10-CM | POA: Diagnosis not present

## 2017-07-08 DIAGNOSIS — Z79899 Other long term (current) drug therapy: Secondary | ICD-10-CM | POA: Diagnosis not present

## 2017-07-08 DIAGNOSIS — Z7189 Other specified counseling: Secondary | ICD-10-CM | POA: Diagnosis not present

## 2017-07-08 DIAGNOSIS — Z299 Encounter for prophylactic measures, unspecified: Secondary | ICD-10-CM | POA: Diagnosis not present

## 2017-07-08 DIAGNOSIS — R5383 Other fatigue: Secondary | ICD-10-CM | POA: Diagnosis not present

## 2017-07-08 DIAGNOSIS — Z125 Encounter for screening for malignant neoplasm of prostate: Secondary | ICD-10-CM | POA: Diagnosis not present

## 2017-07-08 DIAGNOSIS — Z1211 Encounter for screening for malignant neoplasm of colon: Secondary | ICD-10-CM | POA: Diagnosis not present

## 2017-08-03 DIAGNOSIS — I4891 Unspecified atrial fibrillation: Secondary | ICD-10-CM | POA: Diagnosis not present

## 2017-08-03 DIAGNOSIS — E1165 Type 2 diabetes mellitus with hyperglycemia: Secondary | ICD-10-CM | POA: Diagnosis not present

## 2017-08-03 DIAGNOSIS — I4892 Unspecified atrial flutter: Secondary | ICD-10-CM | POA: Diagnosis not present

## 2017-08-03 DIAGNOSIS — Z87891 Personal history of nicotine dependence: Secondary | ICD-10-CM | POA: Diagnosis not present

## 2017-08-03 DIAGNOSIS — Z299 Encounter for prophylactic measures, unspecified: Secondary | ICD-10-CM | POA: Diagnosis not present

## 2017-08-03 DIAGNOSIS — I10 Essential (primary) hypertension: Secondary | ICD-10-CM | POA: Diagnosis not present

## 2017-08-03 DIAGNOSIS — M549 Dorsalgia, unspecified: Secondary | ICD-10-CM | POA: Diagnosis not present

## 2017-08-03 DIAGNOSIS — Z6830 Body mass index (BMI) 30.0-30.9, adult: Secondary | ICD-10-CM | POA: Diagnosis not present

## 2017-08-17 ENCOUNTER — Encounter: Payer: Self-pay | Admitting: Cardiology

## 2017-08-19 ENCOUNTER — Ambulatory Visit (HOSPITAL_COMMUNITY): Payer: Medicare Other | Attending: Cardiovascular Disease

## 2017-08-19 ENCOUNTER — Other Ambulatory Visit: Payer: Self-pay

## 2017-08-19 DIAGNOSIS — I081 Rheumatic disorders of both mitral and tricuspid valves: Secondary | ICD-10-CM | POA: Diagnosis not present

## 2017-08-19 DIAGNOSIS — E119 Type 2 diabetes mellitus without complications: Secondary | ICD-10-CM | POA: Diagnosis not present

## 2017-08-19 DIAGNOSIS — Z8673 Personal history of transient ischemic attack (TIA), and cerebral infarction without residual deficits: Secondary | ICD-10-CM | POA: Insufficient documentation

## 2017-08-19 DIAGNOSIS — I4892 Unspecified atrial flutter: Secondary | ICD-10-CM | POA: Insufficient documentation

## 2017-08-19 DIAGNOSIS — J449 Chronic obstructive pulmonary disease, unspecified: Secondary | ICD-10-CM | POA: Diagnosis not present

## 2017-08-19 DIAGNOSIS — I4891 Unspecified atrial fibrillation: Secondary | ICD-10-CM | POA: Insufficient documentation

## 2017-08-19 DIAGNOSIS — I252 Old myocardial infarction: Secondary | ICD-10-CM | POA: Diagnosis not present

## 2017-08-19 DIAGNOSIS — I110 Hypertensive heart disease with heart failure: Secondary | ICD-10-CM | POA: Insufficient documentation

## 2017-08-19 DIAGNOSIS — I5041 Acute combined systolic (congestive) and diastolic (congestive) heart failure: Secondary | ICD-10-CM | POA: Diagnosis not present

## 2017-08-19 DIAGNOSIS — E785 Hyperlipidemia, unspecified: Secondary | ICD-10-CM | POA: Diagnosis not present

## 2017-08-19 MED ORDER — PERFLUTREN LIPID MICROSPHERE
1.0000 mL | INTRAVENOUS | Status: AC | PRN
  Administered 2017-08-19: 2 mL via INTRAVENOUS

## 2017-08-23 NOTE — Progress Notes (Signed)
HPI The patient presents for followup of atrial fibrillation,  coronary disease and acute systolic HF with EF of 32%.  He had a cardiac cath 05/16/17. Cath revealed occluded SVGs and a patent LIMA-LAD. He underwent PCI with DES to his native OM and CFX. His LVED was also elevated and he was diuresed before discharge.  The EF was increased to 30 - 35% on follow up echo earlier this month.   He presents for med titration.    He says he is feeling much better than he was in early October and is down about 17 pounds since that time.  He been biking half hour every day.  Since being switched off ACE inhibitor to Cozaar his cough has improved.  He is not having any PND or orthopnea.  Is not having any palpitations, presyncope or syncope.  He denies any chest pressure, neck or arm discomfort.  He has had a couple of episodes of low blood sugar.   Allergies  Allergen Reactions  . Augmentin [Amoxicillin-Pot Clavulanate] Other (See Comments)    Unknown Has patient had a PCN reaction causing immediate rash, facial/tongue/throat swelling, SOB or lightheadedness with hypotension: Unknown Has patient had a PCN reaction causing severe rash involving mucus membranes or skin necrosis: Unknown Has patient had a PCN reaction that required hospitalization: Unknown Has patient had a PCN reaction occurring within the last 10 years: No If all of the above answers are "NO", then may proceed with Cephalosporin use.     Current Outpatient Medications  Medication Sig Dispense Refill  . albuterol (PROVENTIL HFA;VENTOLIN HFA) 108 (90 Base) MCG/ACT inhaler Inhale 2 puffs into the lungs every 6 (six) hours as needed for wheezing or shortness of breath.    . allopurinol (ZYLOPRIM) 300 MG tablet Take 300 mg by mouth daily.      Marland Kitchen amLODipine (NORVASC) 5 MG tablet Take 5 mg by mouth every evening.     Marland Kitchen aspirin 81 MG chewable tablet Chew 1 tablet (81 mg total) daily by mouth. 1 tablet 0  . atorvastatin (LIPITOR) 40 MG  tablet Take 1 tablet (40 mg total) daily by mouth. 30 tablet 6  . clopidogrel (PLAVIX) 75 MG tablet Take 1 tablet (75 mg total) daily with breakfast by mouth. 30 tablet 11  . furosemide (LASIX) 40 MG tablet Take 40 mg by mouth daily.    Marland Kitchen glyBURIDE (DIABETA) 5 MG tablet Take 5 mg by mouth 2 (two) times daily with a meal.      . HYDROcodone-acetaminophen (NORCO/VICODIN) 5-325 MG tablet One tablet every four hours as needed for acute pain.  Limit of five days per [place] statue. (Patient taking differently: Take 1 tablet by mouth daily as needed. One tablet every four hours as needed for acute pain.  Limit of five days per [place] statue.) 30 tablet 0  . losartan (COZAAR) 50 MG tablet Take 1 tablet (50 mg total) daily by mouth. 90 tablet 3  . metFORMIN (GLUCOPHAGE) 500 MG tablet Take 500 mg by mouth 2 (two) times daily with a meal.     . Omega-3 Fatty Acids (FISH OIL) 1000 MG CAPS Take 1,000 mg by mouth daily.     . pioglitazone (ACTOS) 45 MG tablet Take 45 mg by mouth daily.     Marland Kitchen spironolactone (ALDACTONE) 25 MG tablet Take 0.5 tablets (12.5 mg total) daily by mouth. 30 tablet 3  . warfarin (COUMADIN) 5 MG tablet Take 5-7.5 mg by mouth daily at  6 PM. Takes 5 mg on Monday, Wednesday and Friday.  All other days takes 7.5 mg    . carvedilol (COREG) 3.125 MG tablet Take 1 tablet (3.125 mg total) by mouth 2 (two) times daily. 180 tablet 3  . furosemide (LASIX) 40 MG tablet Take 1 tablet (40 mg total) by mouth daily.    . nitroGLYCERIN (NITROSTAT) 0.4 MG SL tablet Place 1 tablet (0.4 mg total) under the tongue every 5 (five) minutes x 3 doses as needed for chest pain. If no relief after the 3rd dose, proceed to the ED for an evaluation 25 tablet 3   No current facility-administered medications for this visit.     Past Medical History:  Diagnosis Date  . Arthritis    "neck, lower back" (05/16/2017)  . Atrial fibrillation ([place])   . Atrial flutter ([place])   . Congestive heart failure, unspecified     45 - 50%  . COPD (chronic obstructive pulmonary disease) ([place])   . Coronary atherosclerosis of native coronary artery   . Gout    History of   . Gout    "on daily RX:" (05/16/2017)  . History of ETOH abuse    Quit 20 years ago.   Marland Kitchen Hyperlipidemia   . Mitral valve insufficiency    Mild to mod on echo 2015  . Myocardial infarction ([place]) 1987  . Obesity   . Pneumonia    "twice; not been in the last several years" (05/16/2017)  . Stroke [facility]) 01/2000   right posterior middle cerebral artery cerebrovascular accident/notes 51/11/2010; denies residual on 05/16/2017  . Type II diabetes mellitus ([place])   . Unspecified essential hypertension     Past Surgical History:  Procedure Laterality Date  . CARDIAC CATHETERIZATION  1987   "couldn't get thru"  . CAROTID ENDARTERECTOMY Right 01/2000   WITH DACRON PATCH  . CATARACT EXTRACTION W/ INTRAOCULAR LENS  IMPLANT, BILATERAL    . CORONARY ANGIOPLASTY WITH STENT PLACEMENT  05/16/2017  . CORONARY ARTERY BYPASS GRAFT  1999   CABG X 4  . CORONARY STENT INTERVENTION N/A 05/16/2017   Procedure: CORONARY STENT INTERVENTION;  Surgeon: Leonie Man, MD;  Location: [facility];  Service: Cardiovascular;  Laterality: N/A;  . INCISION AND DRAINAGE ABSCESS ANAL  1987  . INGUINAL HERNIA REPAIR Left   . RIGHT/LEFT HEART CATH AND CORONARY/GRAFT ANGIOGRAPHY N/A 05/16/2017   Procedure: RIGHT/LEFT HEART CATH AND CORONARY/GRAFT ANGIOGRAPHY;  Surgeon: Leonie Man, MD;  Location: [place] [facility];  Service: Cardiovascular;  Laterality: N/A;    ROS:  As stated in the HPI and negative for all other systems.  PHYSICAL EXAM BP 136/62   Pulse (!) 54   Ht 5\' 10"  (1.778 m)   Wt 202 lb (91.6 kg)   BMI 28.98 kg/m   GENERAL:  Well appearing NECK:  No jugular venous distention, waveform within normal limits, carotid upstroke brisk and symmetric, no bruits, no thyromegaly LUNGS:  Clear to auscultation bilaterally CHEST:  Well healed sternotomy  scar. HEART:  PMI not displaced or sustained,S1 and S2 within normal limits, no S3,  no clicks, no rubs, no murmurs, irregular ABD:  Flat, positive bowel sounds normal in frequency in pitch, no bruits, no rebound, no guarding, no midline pulsatile mass, no hepatomegaly, no splenomegaly EXT:  2 plus pulses upper and decreased DP/PT bilateral, no edema, no cyanosis no clubbing   EKG:  NA  ASSESSMENT AND PLAN  CHRONIC SYSTOLIC HF - EF was better.  I will continue with med titration.  Today I am going to start [facility].  I did look at blood work drawn at the end of December from Monico Blitz, MD office and his creatinine was 1.19 with a potassium 4.4.  He was stop his Cozaar.  I will check his blood work again in 1 week.   ATRIAL FIBRILLATION -  Mr. LOIC HOBIN has a CHA2DS2 - VASc score of 4.  He will continue meds as listed.   CAD -  The patient has no new sypmtoms.  No further cardiovascular testing is indicated.  We will continue with aggressive risk reduction and meds as listed.  Carotid stenosis -  No change in therapy.  Continue with risk reduction.   This is followed by Monico Blitz, MD  This is followed by Monico Blitz, MD   HTN (hypertension) -  This is being managed in the context of treating his CHF  DM - Per Dr. Manuella Ghazi.  He has had some low blood sugars recently and he is encouraged to discuss this with Dr. Brigitte Pulse.  I wonder if he might benefit from use ofJardiance for the cardiovascular benefits.

## 2017-08-24 ENCOUNTER — Ambulatory Visit (INDEPENDENT_AMBULATORY_CARE_PROVIDER_SITE_OTHER): Payer: Medicare Other | Admitting: Cardiology

## 2017-08-24 ENCOUNTER — Encounter: Payer: Self-pay | Admitting: Cardiology

## 2017-08-24 VITALS — BP 136/62 | HR 54 | Ht 70.0 in | Wt 202.0 lb

## 2017-08-24 DIAGNOSIS — E118 Type 2 diabetes mellitus with unspecified complications: Secondary | ICD-10-CM

## 2017-08-24 DIAGNOSIS — I10 Essential (primary) hypertension: Secondary | ICD-10-CM | POA: Diagnosis not present

## 2017-08-24 DIAGNOSIS — I482 Chronic atrial fibrillation, unspecified: Secondary | ICD-10-CM

## 2017-08-24 DIAGNOSIS — I5022 Chronic systolic (congestive) heart failure: Secondary | ICD-10-CM | POA: Diagnosis not present

## 2017-08-24 DIAGNOSIS — I2510 Atherosclerotic heart disease of native coronary artery without angina pectoris: Secondary | ICD-10-CM | POA: Diagnosis not present

## 2017-08-24 MED ORDER — SACUBITRIL-VALSARTAN 49-51 MG PO TABS: 1.0000 | ORAL_TABLET | Freq: Two times a day (BID) | ORAL | 11 refills | Status: DC

## 2017-08-24 NOTE — Patient Instructions (Signed)
Medication Instructions:  Please stop Cozaar and start Entresto 49-51 mg twice a day. Continue all other medications as listed.  Labwork: Please have blood work 1 week after starting [employer].  Follow-Up: Follow up in 1 month with Dr Percival Spanish in [place].  If you need a refill on your cardiac medications before your next appointment, please call your pharmacy.  Thank you for choosing [facility]!!

## 2017-09-05 ENCOUNTER — Other Ambulatory Visit: Payer: Self-pay | Admitting: Cardiology

## 2017-09-05 DIAGNOSIS — I509 Heart failure, unspecified: Secondary | ICD-10-CM | POA: Diagnosis not present

## 2017-09-06 LAB — BASIC METABOLIC PANEL
BUN / CREAT RATIO: 27 — AB (ref 10–24)
BUN: 34 mg/dL — ABNORMAL HIGH (ref 8–27)
CO2: 23 mmol/L (ref 20–29)
CREATININE: 1.28 mg/dL — AB (ref 0.76–1.27)
Calcium: 9.3 mg/dL (ref 8.6–10.2)
Chloride: 104 mmol/L (ref 96–106)
GFR calc non Af Amer: 53 mL/min/{1.73_m2} — ABNORMAL LOW (ref >59)
GFR, EST AFRICAN AMERICAN: 62 mL/min/{1.73_m2} (ref >59)
GLUCOSE: 191 mg/dL — AB (ref 65–99)
Potassium: 4.5 mmol/L (ref 3.5–5.2)
SODIUM: 143 mmol/L (ref 134–144)

## 2017-09-07 ENCOUNTER — Telehealth: Payer: Self-pay | Admitting: *Deleted

## 2017-09-07 DIAGNOSIS — Z299 Encounter for prophylactic measures, unspecified: Secondary | ICD-10-CM | POA: Diagnosis not present

## 2017-09-07 DIAGNOSIS — I10 Essential (primary) hypertension: Secondary | ICD-10-CM | POA: Diagnosis not present

## 2017-09-07 DIAGNOSIS — Z6830 Body mass index (BMI) 30.0-30.9, adult: Secondary | ICD-10-CM | POA: Diagnosis not present

## 2017-09-07 DIAGNOSIS — E1165 Type 2 diabetes mellitus with hyperglycemia: Secondary | ICD-10-CM | POA: Diagnosis not present

## 2017-09-07 DIAGNOSIS — Z79899 Other long term (current) drug therapy: Secondary | ICD-10-CM

## 2017-09-07 DIAGNOSIS — I4891 Unspecified atrial fibrillation: Secondary | ICD-10-CM | POA: Diagnosis not present

## 2017-09-07 DIAGNOSIS — J449 Chronic obstructive pulmonary disease, unspecified: Secondary | ICD-10-CM | POA: Diagnosis not present

## 2017-09-07 NOTE — Telephone Encounter (Signed)
Pt aware of his blood work BMP ordered and mail to pt for pt to get done in 2 weeks.

## 2017-09-07 NOTE — Telephone Encounter (Signed)
-----   Message from Minus Breeding, MD sent at 09/07/2017 10:36 AM EST ----- Creat is slightly elevated.  Repeat BMET in 2 weeks.  Call Mr. Speegle with the results and send results to Monico Blitz, MD

## 2017-09-14 ENCOUNTER — Encounter: Payer: Self-pay | Admitting: Cardiology

## 2017-09-14 ENCOUNTER — Ambulatory Visit (INDEPENDENT_AMBULATORY_CARE_PROVIDER_SITE_OTHER): Payer: Medicare Other | Admitting: Cardiology

## 2017-09-14 VITALS — BP 133/63 | HR 51 | Ht 70.0 in | Wt 202.0 lb

## 2017-09-14 DIAGNOSIS — I255 Ischemic cardiomyopathy: Secondary | ICD-10-CM | POA: Diagnosis not present

## 2017-09-14 DIAGNOSIS — I10 Essential (primary) hypertension: Secondary | ICD-10-CM | POA: Diagnosis not present

## 2017-09-14 DIAGNOSIS — I5041 Acute combined systolic (congestive) and diastolic (congestive) heart failure: Secondary | ICD-10-CM

## 2017-09-14 MED ORDER — SACUBITRIL-VALSARTAN 97-103 MG PO TABS: 1.0000 | ORAL_TABLET | Freq: Two times a day (BID) | ORAL | 6 refills | Status: DC

## 2017-09-14 NOTE — Progress Notes (Addendum)
HPI The patient presents for followup of atrial fibrillation,  coronary disease and acute systolic HF with EF of 29%.  He had a cardiac cath 05/16/17. Cath revealed occluded SVGs and a patent LIMA-LAD. He underwent PCI with DES to his native OM and CFX. His LVED was also elevated and he was diuresed before discharge.  The EF was increased to 30 - 35% on follow up echo last month .   He presents for med titration.  At the last visit I started [facility].   Creat did increase slightly.    He felt well with this med change.  The patient denies any new symptoms such as chest discomfort, neck or arm discomfort. There has been no new shortness of breath, PND or orthopnea. There have been no reported palpitations, presyncope or syncope.     Allergies  Allergen Reactions  . Augmentin [Amoxicillin-Pot Clavulanate] Other (See Comments)    Unknown Has patient had a PCN reaction causing immediate rash, facial/tongue/throat swelling, SOB or lightheadedness with hypotension: Unknown Has patient had a PCN reaction causing severe rash involving mucus membranes or skin necrosis: Unknown Has patient had a PCN reaction that required hospitalization: Unknown Has patient had a PCN reaction occurring within the last 10 years: No If all of the above answers are "NO", then may proceed with Cephalosporin use.     Current Outpatient Medications  Medication Sig Dispense Refill  . allopurinol (ZYLOPRIM) 300 MG tablet Take 300 mg by mouth daily.      Marland Kitchen amLODipine (NORVASC) 5 MG tablet Take 5 mg by mouth every evening.     Marland Kitchen aspirin 81 MG chewable tablet Chew 1 tablet (81 mg total) daily by mouth. 1 tablet 0  . atorvastatin (LIPITOR) 40 MG tablet Take 1 tablet (40 mg total) daily by mouth. 30 tablet 6  . carvedilol (COREG) 3.125 MG tablet Take 1 tablet (3.125 mg total) by mouth 2 (two) times daily. 180 tablet 3  . clopidogrel (PLAVIX) 75 MG tablet Take 1 tablet (75 mg total) daily with breakfast by mouth. 30 tablet  11  . furosemide (LASIX) 40 MG tablet Take 40 mg by mouth daily.    Marland Kitchen glyBURIDE (DIABETA) 5 MG tablet Take 5 mg by mouth 2 (two) times daily with a meal.      . HYDROcodone-acetaminophen (NORCO/VICODIN) 5-325 MG tablet One tablet every four hours as needed for acute pain.  Limit of five days per [place] statue. (Patient taking differently: Take 1 tablet by mouth daily as needed. One tablet every four hours as needed for acute pain.  Limit of five days per [place] statue.) 30 tablet 0  . metFORMIN (GLUCOPHAGE) 500 MG tablet Take 500 mg by mouth 2 (two) times daily with a meal.     . Omega-3 Fatty Acids (FISH OIL) 1000 MG CAPS Take 1,000 mg by mouth daily.     . pioglitazone (ACTOS) 45 MG tablet Take 45 mg by mouth daily.     Marland Kitchen spironolactone (ALDACTONE) 25 MG tablet Take 0.5 tablets (12.5 mg total) daily by mouth. 30 tablet 3  . warfarin (COUMADIN) 5 MG tablet Take 5-7.5 mg by mouth daily at 6 PM. Takes 5 mg on Monday, Wednesday and Friday.  All other days takes 7.5 mg    . albuterol (PROVENTIL HFA;VENTOLIN HFA) 108 (90 Base) MCG/ACT inhaler Inhale 2 puffs into the lungs every 6 (six) hours as needed for wheezing or shortness of breath.    . nitroGLYCERIN (  NITROSTAT) 0.4 MG SL tablet Place 1 tablet (0.4 mg total) under the tongue every 5 (five) minutes x 3 doses as needed for chest pain. If no relief after the 3rd dose, proceed to the ED for an evaluation 25 tablet 3  . sacubitril-valsartan (ENTRESTO) 97-103 MG Take 1 tablet by mouth 2 (two) times daily. 60 tablet 6   No current facility-administered medications for this visit.     Past Medical History:  Diagnosis Date  . Arthritis    "neck, lower back" (05/16/2017)  . Atrial fibrillation ([place])   . Atrial flutter ([place])   . Congestive heart failure, unspecified    45 - 50%  . COPD (chronic obstructive pulmonary disease) ([place])   . Coronary atherosclerosis of native coronary artery   . Gout    History of   . Gout    "on daily RX:"  (05/16/2017)  . History of ETOH abuse    Quit 20 years ago.   Marland Kitchen Hyperlipidemia   . Mitral valve insufficiency    Mild to mod on echo 2015  . Myocardial infarction ([place]) 1987  . Obesity   . Pneumonia    "twice; not been in the last several years" (05/16/2017)  . Stroke [facility]) 01/2000   right posterior middle cerebral artery cerebrovascular accident/notes 51/11/2010; denies residual on 05/16/2017  . Type II diabetes mellitus ([place])   . Unspecified essential hypertension     Past Surgical History:  Procedure Laterality Date  . CARDIAC CATHETERIZATION  1987   "couldn't get thru"  . CAROTID ENDARTERECTOMY Right 01/2000   WITH DACRON PATCH  . CATARACT EXTRACTION W/ INTRAOCULAR LENS  IMPLANT, BILATERAL    . CORONARY ANGIOPLASTY WITH STENT PLACEMENT  05/16/2017  . CORONARY ARTERY BYPASS GRAFT  1999   CABG X 4  . CORONARY STENT INTERVENTION N/A 05/16/2017   Procedure: CORONARY STENT INTERVENTION;  Surgeon: Leonie Man, MD;  Location: [place] [facility];  Service: Cardiovascular;  Laterality: N/A;  . INCISION AND DRAINAGE ABSCESS ANAL  1987  . INGUINAL HERNIA REPAIR Left   . RIGHT/LEFT HEART CATH AND CORONARY/GRAFT ANGIOGRAPHY N/A 05/16/2017   Procedure: RIGHT/LEFT HEART CATH AND CORONARY/GRAFT ANGIOGRAPHY;  Surgeon: Leonie Man, MD;  Location: [place] [facility];  Service: Cardiovascular;  Laterality: N/A;    ROS: As stated in the HPI and negative for all other systems.    PHYSICAL EXAM BP 133/63   Pulse (!) 51   Ht 5\' 10"  (1.778 m)   Wt 202 lb (91.6 kg)   BMI 28.98 kg/m   GENERAL:  Well appearing NECK:  No jugular venous distention, waveform within normal limits, carotid upstroke brisk and symmetric, no bruits, no thyromegaly LUNGS:  Clear to auscultation bilaterally CHEST:  Unremarkable HEART:  PMI not displaced or sustained,S1 and S2 within normal limits, no S3, no S4, no clicks, no rubs, no murmurs ABD:  Flat, positive bowel sounds normal in frequency in pitch, no bruits,  no rebound, no guarding, no midline pulsatile mass, no hepatomegaly, no splenomegaly EXT:  2 plus pulses upper and decreased DP/PT bilateral, no edema, no cyanosis no clubbing   EKG: Atrial fibrillation, rate 47, interventricular conduction delay, left axis deviation, no change from previous.  ASSESSMENT AND PLAN  CHRONIC SYSTOLIC HF - EF was better.  I will increase Entresto to 97/103 bid.  He has a BMET scheduled for next week.  I will follow up an echo in the future.   ATRIAL FIBRILLATION -  Mr. Hiroki  L [place] has a CHA2DS2 - VASc score of 4.  He will continue meds as listed.   CAD -  The patient has no new sypmtoms.  No further cardiovascular testing is indicated.  We will continue with aggressive risk reduction and meds as listed.  Carotid stenosis -  This is followed by Monico Blitz, MD  HTN (hypertension) -  This is being managed in the context of treating his CHF.  I will stop his Norvasc as I uptitrate the Entresto  DM - Per Dr. Manuella Ghazi.   I wonder if he might benefit from use ofJardiance for the cardiovascular benefits.

## 2017-09-14 NOTE — Patient Instructions (Signed)
Medication Instructions:  Please in your Entresto to 97-103 mg twice a day. Continue all other medications as listed.  Follow-Up: Follow up in 3 months with Dr Percival Spanish.  If you need a refill on your cardiac medications before your next appointment, please call your pharmacy.  Thank you for choosing [facility]!!

## 2017-09-21 DIAGNOSIS — Z79899 Other long term (current) drug therapy: Secondary | ICD-10-CM | POA: Diagnosis not present

## 2017-09-22 LAB — BASIC METABOLIC PANEL
BUN / CREAT RATIO: 22 (ref 10–24)
BUN: 36 mg/dL — ABNORMAL HIGH (ref 8–27)
CO2: 23 mmol/L (ref 20–29)
Calcium: 9.8 mg/dL (ref 8.6–10.2)
Chloride: 101 mmol/L (ref 96–106)
Creatinine, Ser: 1.64 mg/dL — ABNORMAL HIGH (ref 0.76–1.27)
GFR, EST AFRICAN AMERICAN: 46 mL/min/{1.73_m2} — AB (ref >59)
GFR, EST NON AFRICAN AMERICAN: 39 mL/min/{1.73_m2} — AB (ref >59)
Glucose: 158 mg/dL — ABNORMAL HIGH (ref 65–99)
POTASSIUM: 4.5 mmol/L (ref 3.5–5.2)
SODIUM: 140 mmol/L (ref 134–144)

## 2017-09-26 ENCOUNTER — Telehealth: Payer: Self-pay | Admitting: *Deleted

## 2017-09-26 DIAGNOSIS — Z79899 Other long term (current) drug therapy: Secondary | ICD-10-CM

## 2017-09-26 NOTE — Telephone Encounter (Signed)
Pt aware of his blood work BMP ordered for pt to get done in 2 weeks

## 2017-09-26 NOTE — Telephone Encounter (Signed)
-----   Message from Minus Breeding, MD sent at 09/22/2017 10:18 PM EDT ----- Creat is up slightly.  Please check BMET in two weeks.  Call Mr. Kerwin with the results and send results to Monico Blitz, MD

## 2017-09-28 ENCOUNTER — Ambulatory Visit: Payer: Medicare Other | Admitting: Cardiology

## 2017-10-05 DIAGNOSIS — M549 Dorsalgia, unspecified: Secondary | ICD-10-CM | POA: Diagnosis not present

## 2017-10-05 DIAGNOSIS — I4891 Unspecified atrial fibrillation: Secondary | ICD-10-CM | POA: Diagnosis not present

## 2017-10-05 DIAGNOSIS — Z6830 Body mass index (BMI) 30.0-30.9, adult: Secondary | ICD-10-CM | POA: Diagnosis not present

## 2017-10-05 DIAGNOSIS — Z713 Dietary counseling and surveillance: Secondary | ICD-10-CM | POA: Diagnosis not present

## 2017-10-05 DIAGNOSIS — Z299 Encounter for prophylactic measures, unspecified: Secondary | ICD-10-CM | POA: Diagnosis not present

## 2017-10-10 DIAGNOSIS — Z79899 Other long term (current) drug therapy: Secondary | ICD-10-CM | POA: Diagnosis not present

## 2017-10-11 LAB — BASIC METABOLIC PANEL
BUN / CREAT RATIO: 31 — AB (ref 10–24)
BUN: 46 mg/dL — ABNORMAL HIGH (ref 8–27)
CALCIUM: 9.5 mg/dL (ref 8.6–10.2)
CHLORIDE: 105 mmol/L (ref 96–106)
CO2: 17 mmol/L — ABNORMAL LOW (ref 20–29)
Creatinine, Ser: 1.5 mg/dL — ABNORMAL HIGH (ref 0.76–1.27)
GFR, EST AFRICAN AMERICAN: 51 mL/min/{1.73_m2} — AB (ref >59)
GFR, EST NON AFRICAN AMERICAN: 44 mL/min/{1.73_m2} — AB (ref >59)
Glucose: 255 mg/dL — ABNORMAL HIGH (ref 65–99)
POTASSIUM: 5.6 mmol/L — AB (ref 3.5–5.2)
Sodium: 144 mmol/L (ref 134–144)

## 2017-10-19 ENCOUNTER — Telehealth: Payer: Self-pay | Admitting: Cardiology

## 2017-10-19 DIAGNOSIS — Z79899 Other long term (current) drug therapy: Secondary | ICD-10-CM

## 2017-10-19 DIAGNOSIS — I10 Essential (primary) hypertension: Secondary | ICD-10-CM

## 2017-10-19 NOTE — Telephone Encounter (Signed)
Returned the call to the patient's wife. Per the patient, it was permissible to discuss his lab work with his wife since he is hard of hearing.  Notes recorded by Minus Breeding, MD on 10/16/2017 at 1:01 PM EDT Stop the spironolactone and repeat a BMET in 5 days. Call Adrian Barrett with the results and send results to Monico Blitz, MD  The patient's wife verbalized her understanding to stop the spironolactone. Lab orders have been placed.

## 2017-10-19 NOTE — Telephone Encounter (Signed)
New Message:     Pt's wife is returning a call pertaining to lab results.

## 2017-10-25 DIAGNOSIS — I10 Essential (primary) hypertension: Secondary | ICD-10-CM | POA: Diagnosis not present

## 2017-10-25 DIAGNOSIS — Z79899 Other long term (current) drug therapy: Secondary | ICD-10-CM | POA: Diagnosis not present

## 2017-10-26 LAB — BASIC METABOLIC PANEL
BUN / CREAT RATIO: 30 — AB (ref 10–24)
BUN: 40 mg/dL — AB (ref 8–27)
CALCIUM: 9.3 mg/dL (ref 8.6–10.2)
CHLORIDE: 103 mmol/L (ref 96–106)
CO2: 23 mmol/L (ref 20–29)
CREATININE: 1.35 mg/dL — AB (ref 0.76–1.27)
GFR, EST AFRICAN AMERICAN: 58 mL/min/{1.73_m2} — AB (ref >59)
GFR, EST NON AFRICAN AMERICAN: 50 mL/min/{1.73_m2} — AB (ref >59)
Glucose: 238 mg/dL — ABNORMAL HIGH (ref 65–99)
Potassium: 4.9 mmol/L (ref 3.5–5.2)
Sodium: 141 mmol/L (ref 134–144)

## 2017-11-02 DIAGNOSIS — J449 Chronic obstructive pulmonary disease, unspecified: Secondary | ICD-10-CM | POA: Diagnosis not present

## 2017-11-02 DIAGNOSIS — Z299 Encounter for prophylactic measures, unspecified: Secondary | ICD-10-CM | POA: Diagnosis not present

## 2017-11-02 DIAGNOSIS — M542 Cervicalgia: Secondary | ICD-10-CM | POA: Diagnosis not present

## 2017-11-02 DIAGNOSIS — I4891 Unspecified atrial fibrillation: Secondary | ICD-10-CM | POA: Diagnosis not present

## 2017-11-02 DIAGNOSIS — E1165 Type 2 diabetes mellitus with hyperglycemia: Secondary | ICD-10-CM | POA: Diagnosis not present

## 2017-11-02 DIAGNOSIS — Z6831 Body mass index (BMI) 31.0-31.9, adult: Secondary | ICD-10-CM | POA: Diagnosis not present

## 2017-11-02 DIAGNOSIS — I10 Essential (primary) hypertension: Secondary | ICD-10-CM | POA: Diagnosis not present

## 2017-11-02 DIAGNOSIS — I4892 Unspecified atrial flutter: Secondary | ICD-10-CM | POA: Diagnosis not present

## 2017-11-16 ENCOUNTER — Telehealth: Payer: Self-pay | Admitting: Cardiology

## 2017-11-16 DIAGNOSIS — Z79899 Other long term (current) drug therapy: Secondary | ICD-10-CM

## 2017-11-16 NOTE — Telephone Encounter (Signed)
New Message:       Pt's wife states he is retaining fluid again and wants to know if he can start back taking this medication (spironolactone (ALDACTONE) 25 MG tablet)Pt has put on 7-10 lbs. Pt has been taken off of this medication.

## 2017-11-16 NOTE — Telephone Encounter (Signed)
With verbal approval from pt, spoke with wife who states she has notice a gradual increase in pt's weight since he was taken off spironolactone. She reports his weight was steady around 200lbs and he is now 210. She reports Sob and mild feet and abdominal swelling. Wife is calling to request if pt can continue be put back on spirolactone. Routed to Dr. Warren Lacy.

## 2017-11-17 MED ORDER — FUROSEMIDE 20 MG PO TABS: 60.0000 mg | ORAL_TABLET | Freq: Every day | ORAL | 11 refills | Status: DC

## 2017-11-17 NOTE — Telephone Encounter (Signed)
No.  The spironolactone made the potassium go up too high.  He can increase his Lasix to 60 mg PO daily.  He will need a new prescription to reflect this and needs a BMET in about 10 days after he has started the higher dose.

## 2017-11-17 NOTE — Telephone Encounter (Signed)
Spoke with Adrian Barrett about the changes in his medication to increase lasix to 60 mg daily and not to restart spironolactone and check BMP in 10 daily, pt voice understanding.

## 2017-11-28 DIAGNOSIS — Z79899 Other long term (current) drug therapy: Secondary | ICD-10-CM | POA: Diagnosis not present

## 2017-11-29 LAB — BASIC METABOLIC PANEL
BUN/Creatinine Ratio: 24 (ref 10–24)
BUN: 31 mg/dL — AB (ref 8–27)
CHLORIDE: 103 mmol/L (ref 96–106)
CO2: 24 mmol/L (ref 20–29)
CREATININE: 1.29 mg/dL — AB (ref 0.76–1.27)
Calcium: 9.5 mg/dL (ref 8.6–10.2)
GFR calc Af Amer: 61 mL/min/{1.73_m2} (ref >59)
GFR calc non Af Amer: 53 mL/min/{1.73_m2} — ABNORMAL LOW (ref >59)
GLUCOSE: 262 mg/dL — AB (ref 65–99)
Potassium: 4.7 mmol/L (ref 3.5–5.2)
SODIUM: 142 mmol/L (ref 134–144)

## 2017-11-30 ENCOUNTER — Encounter: Payer: Self-pay | Admitting: *Deleted

## 2017-11-30 DIAGNOSIS — Z299 Encounter for prophylactic measures, unspecified: Secondary | ICD-10-CM | POA: Diagnosis not present

## 2017-11-30 DIAGNOSIS — I509 Heart failure, unspecified: Secondary | ICD-10-CM | POA: Diagnosis not present

## 2017-11-30 DIAGNOSIS — I4891 Unspecified atrial fibrillation: Secondary | ICD-10-CM | POA: Diagnosis not present

## 2017-11-30 DIAGNOSIS — Z6832 Body mass index (BMI) 32.0-32.9, adult: Secondary | ICD-10-CM | POA: Diagnosis not present

## 2017-11-30 DIAGNOSIS — I10 Essential (primary) hypertension: Secondary | ICD-10-CM | POA: Diagnosis not present

## 2017-11-30 DIAGNOSIS — R001 Bradycardia, unspecified: Secondary | ICD-10-CM | POA: Diagnosis not present

## 2017-11-30 DIAGNOSIS — E1165 Type 2 diabetes mellitus with hyperglycemia: Secondary | ICD-10-CM | POA: Diagnosis not present

## 2017-12-07 DIAGNOSIS — M549 Dorsalgia, unspecified: Secondary | ICD-10-CM | POA: Diagnosis not present

## 2017-12-07 DIAGNOSIS — I509 Heart failure, unspecified: Secondary | ICD-10-CM | POA: Diagnosis not present

## 2017-12-07 DIAGNOSIS — E1165 Type 2 diabetes mellitus with hyperglycemia: Secondary | ICD-10-CM | POA: Diagnosis not present

## 2017-12-07 DIAGNOSIS — I10 Essential (primary) hypertension: Secondary | ICD-10-CM | POA: Diagnosis not present

## 2017-12-07 DIAGNOSIS — I2510 Atherosclerotic heart disease of native coronary artery without angina pectoris: Secondary | ICD-10-CM | POA: Diagnosis not present

## 2017-12-07 DIAGNOSIS — Z299 Encounter for prophylactic measures, unspecified: Secondary | ICD-10-CM | POA: Diagnosis not present

## 2017-12-07 DIAGNOSIS — I4891 Unspecified atrial fibrillation: Secondary | ICD-10-CM | POA: Diagnosis not present

## 2017-12-07 DIAGNOSIS — Z6832 Body mass index (BMI) 32.0-32.9, adult: Secondary | ICD-10-CM | POA: Diagnosis not present

## 2017-12-12 ENCOUNTER — Other Ambulatory Visit: Payer: Self-pay | Admitting: Physician Assistant

## 2017-12-13 NOTE — Telephone Encounter (Signed)
Rx sent to pharmacy   

## 2017-12-21 ENCOUNTER — Ambulatory Visit: Payer: Medicare Other | Admitting: Cardiology

## 2017-12-28 DIAGNOSIS — Z6830 Body mass index (BMI) 30.0-30.9, adult: Secondary | ICD-10-CM | POA: Diagnosis not present

## 2017-12-28 DIAGNOSIS — M549 Dorsalgia, unspecified: Secondary | ICD-10-CM | POA: Diagnosis not present

## 2017-12-28 DIAGNOSIS — Z299 Encounter for prophylactic measures, unspecified: Secondary | ICD-10-CM | POA: Diagnosis not present

## 2017-12-28 DIAGNOSIS — I4891 Unspecified atrial fibrillation: Secondary | ICD-10-CM | POA: Diagnosis not present

## 2017-12-28 DIAGNOSIS — I10 Essential (primary) hypertension: Secondary | ICD-10-CM | POA: Diagnosis not present

## 2017-12-28 DIAGNOSIS — E1165 Type 2 diabetes mellitus with hyperglycemia: Secondary | ICD-10-CM | POA: Diagnosis not present

## 2018-01-16 DIAGNOSIS — H04123 Dry eye syndrome of bilateral lacrimal glands: Secondary | ICD-10-CM | POA: Diagnosis not present

## 2018-01-16 DIAGNOSIS — Z961 Presence of intraocular lens: Secondary | ICD-10-CM | POA: Diagnosis not present

## 2018-01-16 DIAGNOSIS — H31093 Other chorioretinal scars, bilateral: Secondary | ICD-10-CM | POA: Diagnosis not present

## 2018-01-16 DIAGNOSIS — H35372 Puckering of macula, left eye: Secondary | ICD-10-CM | POA: Diagnosis not present

## 2018-01-16 DIAGNOSIS — E119 Type 2 diabetes mellitus without complications: Secondary | ICD-10-CM | POA: Diagnosis not present

## 2018-01-16 DIAGNOSIS — H4322 Crystalline deposits in vitreous body, left eye: Secondary | ICD-10-CM | POA: Diagnosis not present

## 2018-01-25 DIAGNOSIS — E1165 Type 2 diabetes mellitus with hyperglycemia: Secondary | ICD-10-CM | POA: Diagnosis not present

## 2018-01-25 DIAGNOSIS — Z299 Encounter for prophylactic measures, unspecified: Secondary | ICD-10-CM | POA: Diagnosis not present

## 2018-01-25 DIAGNOSIS — I4891 Unspecified atrial fibrillation: Secondary | ICD-10-CM | POA: Diagnosis not present

## 2018-01-25 DIAGNOSIS — M549 Dorsalgia, unspecified: Secondary | ICD-10-CM | POA: Diagnosis not present

## 2018-01-25 DIAGNOSIS — I10 Essential (primary) hypertension: Secondary | ICD-10-CM | POA: Diagnosis not present

## 2018-01-25 DIAGNOSIS — Z6830 Body mass index (BMI) 30.0-30.9, adult: Secondary | ICD-10-CM | POA: Diagnosis not present

## 2018-01-25 DIAGNOSIS — I4892 Unspecified atrial flutter: Secondary | ICD-10-CM | POA: Diagnosis not present

## 2018-01-31 ENCOUNTER — Encounter: Payer: Self-pay | Admitting: Cardiology

## 2018-01-31 NOTE — Progress Notes (Signed)
HPI The patient presents for followup of atrial fibrillation,  coronary disease and acute systolic HF with EF of 42%.  He had a cardiac cath 05/16/17. Cath revealed occluded SVGs and a patent LIMA-LAD. He underwent PCI with DES to his native OM and CFX. His LVED was also elevated and he was diuresed before discharge.  The EF was increased to 30 - 35% on follow up echo last month .   He presents for med titration.  I have titrated his Entresto.  However, I stopped his spironolactone secondary to hyperkalemia.   However, he had increased symptoms and he had spironolactone was restarted.   Potassium and creat were stable.   I reviewed phone notes and labs for this appt.   He says he has been feeling well.  I do note from his previous office note that he was supposed to stop the aspirin but he does not seem to have gotten this message.  He is actually been doing well.  He exercises 20 minutes every day. The patient denies any new symptoms such as chest discomfort, neck or arm discomfort. There has been no new shortness of breath, PND or orthopnea. There have been no reported palpitations, presyncope or syncope.    Allergies  Allergen Reactions  . Augmentin [Amoxicillin-Pot Clavulanate] Other (See Comments)    Unknown Has patient had a PCN reaction causing immediate rash, facial/tongue/throat swelling, SOB or lightheadedness with hypotension: Unknown Has patient had a PCN reaction causing severe rash involving mucus membranes or skin necrosis: Unknown Has patient had a PCN reaction that required hospitalization: Unknown Has patient had a PCN reaction occurring within the last 10 years: No If all of the above answers are "NO", then may proceed with Cephalosporin use.     Current Outpatient Medications  Medication Sig Dispense Refill  . allopurinol (ZYLOPRIM) 300 MG tablet Take 300 mg by mouth daily.      Marland Kitchen atorvastatin (LIPITOR) 40 MG tablet TAKE 1 TABLET BY MOUTH ONCE DAILY 30 tablet 6  .  clopidogrel (PLAVIX) 75 MG tablet Take 1 tablet (75 mg total) daily with breakfast by mouth. 30 tablet 11  . furosemide (LASIX) 20 MG tablet Take 3 tablets (60 mg total) by mouth daily. 90 tablet 11  . glyBURIDE (DIABETA) 5 MG tablet Take 5 mg by mouth daily with breakfast.     . HYDROcodone-acetaminophen (NORCO/VICODIN) 5-325 MG tablet One tablet every four hours as needed for acute pain.  Limit of five days per [place] statue. (Patient taking differently: Take 1 tablet by mouth daily as needed. One tablet every four hours as needed for acute pain.  Limit of five days per [place] statue.) 30 tablet 0  . metFORMIN (GLUCOPHAGE) 500 MG tablet Take 500 mg by mouth 2 (two) times daily with a meal.     . Omega-3 Fatty Acids (FISH OIL) 1000 MG CAPS Take 1,000 mg by mouth daily.     . sacubitril-valsartan (ENTRESTO) 97-103 MG Take 1 tablet by mouth 2 (two) times daily. 60 tablet 6  . warfarin (COUMADIN) 5 MG tablet Take 5-7.5 mg by mouth daily at 6 PM. Takes 5 mg on Monday, Wednesday and Friday.  All other days takes 7.5 mg    . albuterol (PROVENTIL HFA;VENTOLIN HFA) 108 (90 Base) MCG/ACT inhaler Inhale 2 puffs into the lungs every 6 (six) hours as needed for wheezing or shortness of breath.    . nitroGLYCERIN (NITROSTAT) 0.4 MG SL tablet Place 1 tablet (  0.4 mg total) under the tongue every 5 (five) minutes x 3 doses as needed for chest pain. If no relief after the 3rd dose, proceed to the ED for an evaluation 25 tablet 3   No current facility-administered medications for this visit.     Past Medical History:  Diagnosis Date  . Arthritis    "neck, lower back" (05/16/2017)  . Atrial fibrillation ([place])   . Atrial flutter ([place])   . Congestive heart failure, unspecified    45 - 50%  . COPD (chronic obstructive pulmonary disease) ([place])   . Coronary atherosclerosis of native coronary artery   . Gout    "on daily RX:" (05/16/2017)  . History of ETOH abuse    Quit 20 years ago.   Marland Kitchen Hyperlipidemia   .  Mitral valve insufficiency    Mild to mod on echo 2015  . Myocardial infarction ([place]) 1987  . Obesity   . Stroke [facility]) 01/2000   right posterior middle cerebral artery cerebrovascular accident/notes 51/11/2010; denies residual on 05/16/2017  . Type II diabetes mellitus ([place])   . Unspecified essential hypertension     Past Surgical History:  Procedure Laterality Date  . CARDIAC CATHETERIZATION  1987   "couldn't get thru"  . CAROTID ENDARTERECTOMY Right 01/2000   WITH DACRON PATCH  . CATARACT EXTRACTION W/ INTRAOCULAR LENS  IMPLANT, BILATERAL    . CORONARY ANGIOPLASTY WITH STENT PLACEMENT  05/16/2017  . CORONARY ARTERY BYPASS GRAFT  1999   CABG X 4  . CORONARY STENT INTERVENTION N/A 05/16/2017   Procedure: CORONARY STENT INTERVENTION;  Surgeon: Leonie Man, MD;  Location: [place] [facility];  Service: Cardiovascular;  Laterality: N/A;  . INCISION AND DRAINAGE ABSCESS ANAL  1987  . INGUINAL HERNIA REPAIR Left   . RIGHT/LEFT HEART CATH AND CORONARY/GRAFT ANGIOGRAPHY N/A 05/16/2017   Procedure: RIGHT/LEFT HEART CATH AND CORONARY/GRAFT ANGIOGRAPHY;  Surgeon: Leonie Man, MD;  Location: [place] [facility];  Service: Cardiovascular;  Laterality: N/A;    ROS:   Positive for back pain and neck pain.  Otherwise as stated in the HPI and negative for all other systems.  PHYSICAL EXAM BP 102/68   Pulse 60   Ht 5' 10.5" (1.791 m)   Wt 188 lb (85.3 kg)   BMI 26.59 kg/m   GENERAL:  Well appearing NECK:  No jugular venous distention, waveform within normal limits, carotid upstroke brisk and symmetric, no bruits, no thyromegaly LUNGS:  Clear to auscultation bilaterally CHEST:  Unremarkable HEART:  PMI not displaced or sustained,S1 and S2 within normal limits, no S3, no S4, no clicks, no rubs, no murmurs ABD:  Flat, positive bowel sounds normal in frequency in pitch, no bruits, no rebound, no guarding, no midline pulsatile mass, no hepatomegaly, no splenomegaly EXT:  2 plus pulses upper,  dorsalis pedis and posterior tibialis decreased bilateral, no edema, no cyanosis no clubbing SKIN:  Bruising  EKG: Atrial fibrillation, rate 59, interventricular conduction delay, left axis deviation, no change from previous.  02/01/2018  ASSESSMENT AND PLAN  CHRONIC SYSTOLIC HF - He seems to be euvolemic.   EF was better in Feb.   his blood pressure will not allow further med titration and is on a good regimen.  I will check his potassium and renal function.    ATRIAL FIBRILLATION -  Mr. RUBIN DAIS has a CHA2DS2 - VASc score of 4.  He will continue the meds as listed.  CAD -  He is going to  stop his aspirin.  For now I am going to continue the Plavix with the Coumadin.  No further testing is planned at this point.  Carotid stenosis -  This has been followed by Monico Blitz, MD   HTN (hypertension) -  The blood pressure is at target. No change in medications is indicated. We will continue with therapeutic lifestyle changes (TLC).  DM - This is followed by Monico Blitz, MD   DYSLIPIDEMIA - I will check a lipid profile.

## 2018-02-01 ENCOUNTER — Ambulatory Visit (INDEPENDENT_AMBULATORY_CARE_PROVIDER_SITE_OTHER): Payer: Medicare Other | Admitting: Cardiology

## 2018-02-01 ENCOUNTER — Encounter: Payer: Self-pay | Admitting: Cardiology

## 2018-02-01 VITALS — BP 102/68 | HR 60 | Ht 70.5 in | Wt 188.0 lb

## 2018-02-01 DIAGNOSIS — I10 Essential (primary) hypertension: Secondary | ICD-10-CM

## 2018-02-01 DIAGNOSIS — I255 Ischemic cardiomyopathy: Secondary | ICD-10-CM

## 2018-02-01 DIAGNOSIS — Z79899 Other long term (current) drug therapy: Secondary | ICD-10-CM | POA: Diagnosis not present

## 2018-02-01 DIAGNOSIS — I482 Chronic atrial fibrillation, unspecified: Secondary | ICD-10-CM

## 2018-02-01 DIAGNOSIS — E785 Hyperlipidemia, unspecified: Secondary | ICD-10-CM

## 2018-02-01 DIAGNOSIS — I5022 Chronic systolic (congestive) heart failure: Secondary | ICD-10-CM | POA: Insufficient documentation

## 2018-02-01 NOTE — Patient Instructions (Signed)
Medication Instructions:  Stop your Aspirin. Continue all other medications as listed.  Labwork: Please have fasting lipid and CMP. (order given)  Follow-Up: Follow up in 3 months with Dr Percival Spanish.  If you need a refill on your cardiac medications before your next appointment, please call your pharmacy.  Thank you for choosing [facility]!!

## 2018-02-03 ENCOUNTER — Other Ambulatory Visit: Payer: Self-pay | Admitting: Cardiology

## 2018-02-03 DIAGNOSIS — E785 Hyperlipidemia, unspecified: Secondary | ICD-10-CM | POA: Diagnosis not present

## 2018-02-03 DIAGNOSIS — Z79899 Other long term (current) drug therapy: Secondary | ICD-10-CM | POA: Diagnosis not present

## 2018-02-04 LAB — COMPREHENSIVE METABOLIC PANEL
A/G RATIO: 1.6 (ref 1.2–2.2)
ALT: 32 IU/L (ref 0–44)
AST: 34 IU/L (ref 0–40)
Albumin: 4.6 g/dL (ref 3.5–4.8)
Alkaline Phosphatase: 110 IU/L (ref 39–117)
BUN / CREAT RATIO: 33 — AB (ref 10–24)
BUN: 50 mg/dL — ABNORMAL HIGH (ref 8–27)
Bilirubin Total: 0.5 mg/dL (ref 0.0–1.2)
CALCIUM: 9.6 mg/dL (ref 8.6–10.2)
CO2: 20 mmol/L (ref 20–29)
CREATININE: 1.5 mg/dL — AB (ref 0.76–1.27)
Chloride: 103 mmol/L (ref 96–106)
GFR, EST AFRICAN AMERICAN: 51 mL/min/{1.73_m2} — AB (ref >59)
GFR, EST NON AFRICAN AMERICAN: 44 mL/min/{1.73_m2} — AB (ref >59)
GLOBULIN, TOTAL: 2.8 g/dL (ref 1.5–4.5)
Glucose: 149 mg/dL — ABNORMAL HIGH (ref 65–99)
Potassium: 5 mmol/L (ref 3.5–5.2)
Sodium: 143 mmol/L (ref 134–144)
TOTAL PROTEIN: 7.4 g/dL (ref 6.0–8.5)

## 2018-02-04 LAB — LIPID PANEL W/O CHOL/HDL RATIO
CHOLESTEROL TOTAL: 124 mg/dL (ref 100–199)
HDL: 48 mg/dL (ref >39)
LDL CALC: 52 mg/dL (ref 0–99)
Triglycerides: 121 mg/dL (ref 0–149)
VLDL CHOLESTEROL CAL: 24 mg/dL (ref 5–40)

## 2018-02-08 ENCOUNTER — Telehealth: Payer: Self-pay | Admitting: Cardiology

## 2018-02-08 DIAGNOSIS — Z79899 Other long term (current) drug therapy: Secondary | ICD-10-CM

## 2018-02-08 NOTE — Telephone Encounter (Signed)
Notes recorded by Minus Breeding, MD on 02/07/2018 at 3:25 PM EDT Creat is mildly elevated. Continue current therapy and repeat labs in two weeks. Call Mr. Mcquarrie with the results and send results to Monico Blitz, MD  Wife notified of results. Patient will recheck labs in [place]

## 2018-02-08 NOTE — Telephone Encounter (Signed)
New Message:      Pt is returning a call from yesterday for lab results.

## 2018-02-22 DIAGNOSIS — I4891 Unspecified atrial fibrillation: Secondary | ICD-10-CM | POA: Diagnosis not present

## 2018-02-22 DIAGNOSIS — I10 Essential (primary) hypertension: Secondary | ICD-10-CM | POA: Diagnosis not present

## 2018-02-22 DIAGNOSIS — Z299 Encounter for prophylactic measures, unspecified: Secondary | ICD-10-CM | POA: Diagnosis not present

## 2018-02-22 DIAGNOSIS — I509 Heart failure, unspecified: Secondary | ICD-10-CM | POA: Diagnosis not present

## 2018-02-22 DIAGNOSIS — M1990 Unspecified osteoarthritis, unspecified site: Secondary | ICD-10-CM | POA: Diagnosis not present

## 2018-02-22 DIAGNOSIS — Z6829 Body mass index (BMI) 29.0-29.9, adult: Secondary | ICD-10-CM | POA: Diagnosis not present

## 2018-02-23 DIAGNOSIS — Z79899 Other long term (current) drug therapy: Secondary | ICD-10-CM | POA: Diagnosis not present

## 2018-02-24 LAB — BASIC METABOLIC PANEL
BUN/Creatinine Ratio: 29 — ABNORMAL HIGH (ref 10–24)
BUN: 40 mg/dL — AB (ref 8–27)
CALCIUM: 9.4 mg/dL (ref 8.6–10.2)
CO2: 26 mmol/L (ref 20–29)
Chloride: 102 mmol/L (ref 96–106)
Creatinine, Ser: 1.36 mg/dL — ABNORMAL HIGH (ref 0.76–1.27)
GFR, EST AFRICAN AMERICAN: 57 mL/min/{1.73_m2} — AB (ref >59)
GFR, EST NON AFRICAN AMERICAN: 49 mL/min/{1.73_m2} — AB (ref >59)
Glucose: 220 mg/dL — ABNORMAL HIGH (ref 65–99)
Potassium: 4.6 mmol/L (ref 3.5–5.2)
Sodium: 141 mmol/L (ref 134–144)

## 2018-03-10 ENCOUNTER — Telehealth: Payer: Self-pay | Admitting: Cardiology

## 2018-03-10 MED ORDER — CLOPIDOGREL BISULFATE 75 MG PO TABS: 75.0000 mg | ORAL_TABLET | Freq: Every day | ORAL | 3 refills | Status: DC

## 2018-03-10 NOTE — Telephone Encounter (Addendum)
rx sent to pharmacy

## 2018-03-10 NOTE — Telephone Encounter (Signed)
New Message          *STAT* If patient is at the pharmacy, call can be transferred to refill team.   1. Which medications need to be refilled? (please list name of each medication and dose if known) clopidogrel (PLAVIX) 75 MG tablet  2. Which pharmacy/location (including street and city if local pharmacy) is medication to be sent to? [facility] in [place]  3. Do they need a 30 day or 90 day supply? [place]

## 2018-03-24 DIAGNOSIS — Z6828 Body mass index (BMI) 28.0-28.9, adult: Secondary | ICD-10-CM | POA: Diagnosis not present

## 2018-03-24 DIAGNOSIS — I509 Heart failure, unspecified: Secondary | ICD-10-CM | POA: Diagnosis not present

## 2018-03-24 DIAGNOSIS — M542 Cervicalgia: Secondary | ICD-10-CM | POA: Diagnosis not present

## 2018-03-24 DIAGNOSIS — I4891 Unspecified atrial fibrillation: Secondary | ICD-10-CM | POA: Diagnosis not present

## 2018-03-24 DIAGNOSIS — I10 Essential (primary) hypertension: Secondary | ICD-10-CM | POA: Diagnosis not present

## 2018-03-24 DIAGNOSIS — E1165 Type 2 diabetes mellitus with hyperglycemia: Secondary | ICD-10-CM | POA: Diagnosis not present

## 2018-03-24 DIAGNOSIS — Z299 Encounter for prophylactic measures, unspecified: Secondary | ICD-10-CM | POA: Diagnosis not present

## 2018-04-14 ENCOUNTER — Other Ambulatory Visit: Payer: Self-pay | Admitting: Cardiology

## 2018-04-14 DIAGNOSIS — Z23 Encounter for immunization: Secondary | ICD-10-CM | POA: Diagnosis not present

## 2018-04-20 DIAGNOSIS — M549 Dorsalgia, unspecified: Secondary | ICD-10-CM | POA: Diagnosis not present

## 2018-04-20 DIAGNOSIS — E1165 Type 2 diabetes mellitus with hyperglycemia: Secondary | ICD-10-CM | POA: Diagnosis not present

## 2018-04-20 DIAGNOSIS — Z6828 Body mass index (BMI) 28.0-28.9, adult: Secondary | ICD-10-CM | POA: Diagnosis not present

## 2018-04-20 DIAGNOSIS — Z299 Encounter for prophylactic measures, unspecified: Secondary | ICD-10-CM | POA: Diagnosis not present

## 2018-04-20 DIAGNOSIS — I10 Essential (primary) hypertension: Secondary | ICD-10-CM | POA: Diagnosis not present

## 2018-04-20 DIAGNOSIS — I4891 Unspecified atrial fibrillation: Secondary | ICD-10-CM | POA: Diagnosis not present

## 2018-05-10 ENCOUNTER — Ambulatory Visit: Payer: Medicare Other | Admitting: Cardiology

## 2018-05-22 DIAGNOSIS — I509 Heart failure, unspecified: Secondary | ICD-10-CM | POA: Diagnosis not present

## 2018-05-22 DIAGNOSIS — Z299 Encounter for prophylactic measures, unspecified: Secondary | ICD-10-CM | POA: Diagnosis not present

## 2018-05-22 DIAGNOSIS — Z6828 Body mass index (BMI) 28.0-28.9, adult: Secondary | ICD-10-CM | POA: Diagnosis not present

## 2018-05-22 DIAGNOSIS — I4892 Unspecified atrial flutter: Secondary | ICD-10-CM | POA: Diagnosis not present

## 2018-05-22 DIAGNOSIS — J449 Chronic obstructive pulmonary disease, unspecified: Secondary | ICD-10-CM | POA: Diagnosis not present

## 2018-05-22 DIAGNOSIS — E1165 Type 2 diabetes mellitus with hyperglycemia: Secondary | ICD-10-CM | POA: Diagnosis not present

## 2018-06-03 ENCOUNTER — Other Ambulatory Visit: Payer: Self-pay | Admitting: Cardiology

## 2018-06-06 DIAGNOSIS — I4891 Unspecified atrial fibrillation: Secondary | ICD-10-CM | POA: Diagnosis not present

## 2018-06-06 DIAGNOSIS — I10 Essential (primary) hypertension: Secondary | ICD-10-CM | POA: Diagnosis not present

## 2018-06-06 DIAGNOSIS — E1165 Type 2 diabetes mellitus with hyperglycemia: Secondary | ICD-10-CM | POA: Diagnosis not present

## 2018-06-06 DIAGNOSIS — Z299 Encounter for prophylactic measures, unspecified: Secondary | ICD-10-CM | POA: Diagnosis not present

## 2018-06-06 DIAGNOSIS — I509 Heart failure, unspecified: Secondary | ICD-10-CM | POA: Diagnosis not present

## 2018-06-06 DIAGNOSIS — Z6828 Body mass index (BMI) 28.0-28.9, adult: Secondary | ICD-10-CM | POA: Diagnosis not present

## 2018-06-18 NOTE — Progress Notes (Signed)
HPI The patient presents for followup of atrial fibrillation,  coronary disease and acute systolic HF with EF of 78%.  He had a cardiac cath 05/16/17. Cath revealed occluded SVGs and a patent LIMA-LAD. He underwent PCI with DES to his native OM and CFX. His LVED was also elevated and he was diuresed before discharge.  The EF was increased to 35 -40% on follow up echo last in Feb. since I last saw him he is done well.  He feels excellent.  He does not feel any palpitations, presyncope or syncope despite his slow heart rate.  He rides an exercise bike 1/2-hour a day.  He has no limitations with this.  He denies any chest pressure, neck or arm discomfort.  He has had no PND or orthopnea.  He has had none of the shortness of breath that prompted his previous admission and treatment for coronary disease as above.   Allergies  Allergen Reactions  . Augmentin [Amoxicillin-Pot Clavulanate] Other (See Comments)    Unknown Has patient had a PCN reaction causing immediate rash, facial/tongue/throat swelling, SOB or lightheadedness with hypotension: Unknown Has patient had a PCN reaction causing severe rash involving mucus membranes or skin necrosis: Unknown Has patient had a PCN reaction that required hospitalization: Unknown Has patient had a PCN reaction occurring within the last 10 years: No If all of the above answers are "NO", then may proceed with Cephalosporin use.     Current Outpatient Medications  Medication Sig Dispense Refill  . albuterol (PROVENTIL HFA;VENTOLIN HFA) 108 (90 Base) MCG/ACT inhaler Inhale 2 puffs into the lungs every 6 (six) hours as needed for wheezing or shortness of breath.    . allopurinol (ZYLOPRIM) 300 MG tablet Take 300 mg by mouth daily.      Marland Kitchen atorvastatin (LIPITOR) 40 MG tablet TAKE 1 TABLET BY MOUTH ONCE DAILY 90 tablet 1  . clopidogrel (PLAVIX) 75 MG tablet Take 1 tablet (75 mg total) by mouth daily with breakfast. 90 tablet 3  . furosemide (LASIX) 20 MG  tablet Take 3 tablets (60 mg total) by mouth daily. 90 tablet 11  . HYDROcodone-acetaminophen (NORCO/VICODIN) 5-325 MG tablet One tablet every four hours as needed for acute pain.  Limit of five days per [place] statue. (Patient taking differently: Take 1 tablet by mouth daily as needed. One tablet every four hours as needed for acute pain.  Limit of five days per [place] statue.) 30 tablet 0  . JARDIANCE 25 MG TABS tablet Take 25 mg by mouth daily.  2  . metFORMIN (GLUCOPHAGE) 500 MG tablet Take 500 mg by mouth 2 (two) times daily with a meal.     . nitroGLYCERIN (NITROSTAT) 0.4 MG SL tablet Place 1 tablet (0.4 mg total) under the tongue every 5 (five) minutes x 3 doses as needed for chest pain. If no relief after the 3rd dose, proceed to the ED for an evaluation 25 tablet 3  . Omega-3 Fatty Acids (FISH OIL) 1000 MG CAPS Take 1,000 mg by mouth daily.     . sacubitril-valsartan (ENTRESTO) 97-103 MG Take 1 tablet by mouth 2 (two) times daily. 180 tablet 2  . warfarin (COUMADIN) 5 MG tablet Take 5-7.5 mg by mouth daily at 6 PM. Takes 5 mg on Monday, Wednesday and Friday.  All other days takes 7.5 mg     No current facility-administered medications for this visit.     Past Medical History:  Diagnosis Date  . Arthritis    "  neck, lower back" (05/16/2017)  . Atrial fibrillation ([place])   . Atrial flutter ([place])   . Congestive heart failure, unspecified    45 - 50%  . COPD (chronic obstructive pulmonary disease) ([place])   . Coronary atherosclerosis of native coronary artery   . Gout    "on daily RX:" (05/16/2017)  . History of ETOH abuse    Quit 20 years ago.   Marland Kitchen Hyperlipidemia   . Mitral valve insufficiency    Mild to mod on echo 2015  . Myocardial infarction ([place]) 1987  . Obesity   . Stroke [facility]) 01/2000   right posterior middle cerebral artery cerebrovascular accident/notes 51/11/2010; denies residual on 05/16/2017  . Type II diabetes mellitus ([place])   . Unspecified essential hypertension      Past Surgical History:  Procedure Laterality Date  . CARDIAC CATHETERIZATION  1987   "couldn't get thru"  . CAROTID ENDARTERECTOMY Right 01/2000   WITH DACRON PATCH  . CATARACT EXTRACTION W/ INTRAOCULAR LENS  IMPLANT, BILATERAL    . CORONARY ANGIOPLASTY WITH STENT PLACEMENT  05/16/2017  . CORONARY ARTERY BYPASS GRAFT  1999   CABG X 4  . CORONARY STENT INTERVENTION N/A 05/16/2017   Procedure: CORONARY STENT INTERVENTION;  Surgeon: Leonie Man, MD;  Location: [place] [facility];  Service: Cardiovascular;  Laterality: N/A;  . INCISION AND DRAINAGE ABSCESS ANAL  1987  . INGUINAL HERNIA REPAIR Left   . RIGHT/LEFT HEART CATH AND CORONARY/GRAFT ANGIOGRAPHY N/A 05/16/2017   Procedure: RIGHT/LEFT HEART CATH AND CORONARY/GRAFT ANGIOGRAPHY;  Surgeon: Leonie Man, MD;  Location: [place] [facility];  Service: Cardiovascular;  Laterality: N/A;    ROS:   As stated in the HPI and negative for all other systems.  PHYSICAL EXAM BP 120/62   Pulse (!) 45   Ht 5\' 10"  (1.778 m)   Wt 186 lb (84.4 kg)   BMI 26.69 kg/m   GENERAL:  Well appearing NECK:  No jugular venous distention, waveform within normal limits, carotid upstroke brisk and symmetric, no bruits, no thyromegaly LUNGS:  Clear to auscultation bilaterally CHEST:  Unremarkable HEART:  PMI not displaced or sustained,S1 and S2 within normal limits, no S3, no S4, no clicks, no rubs, no murmurs ABD:  Flat, positive bowel sounds normal in frequency in pitch, no bruits, no rebound, no guarding, no midline pulsatile mass, no hepatomegaly, no splenomegaly EXT:  2 plus pulses throughout, no edema, no cyanosis no clubbing      EKG: Atrial fibrillation, rate 45, interventricular conduction delay, left axis deviation, no change from previous.  PVCs 06/21/2018  ASSESSMENT AND PLAN  CHRONIC SYSTOLIC HF - He is euvolemic.  His ejection fraction was improved last year.  At this point no change in therapy.   ATRIAL FIBRILLATION -  [name] has a CHA2DS2 - VASc score of 4.   He tolerates anticoagulation.  He understands that should he have any presyncope or syncope in the future I would need to know since he has a baseline bradycardia.   CAD -  Given the extent of his coronary disease in the past I am going to continue his Plavix and his aspirin we discussed the risk benefits of this.  Carotid stenosis -  No change in therapy.  This followed by Dr. Manuella Ghazi.  HTN (hypertension) -  The blood pressure is at target.  No change in therapy.  DM - This is followed by  Monico Blitz, MD   DYSLIPIDEMIA - The  LDL was 52.  Continue current therapy.

## 2018-06-21 ENCOUNTER — Ambulatory Visit (INDEPENDENT_AMBULATORY_CARE_PROVIDER_SITE_OTHER): Payer: Medicare Other | Admitting: Cardiology

## 2018-06-21 ENCOUNTER — Encounter: Payer: Self-pay | Admitting: Cardiology

## 2018-06-21 VITALS — BP 120/62 | HR 45 | Ht 70.0 in | Wt 186.0 lb

## 2018-06-21 DIAGNOSIS — I255 Ischemic cardiomyopathy: Secondary | ICD-10-CM

## 2018-06-21 DIAGNOSIS — I482 Chronic atrial fibrillation, unspecified: Secondary | ICD-10-CM

## 2018-06-21 DIAGNOSIS — I5022 Chronic systolic (congestive) heart failure: Secondary | ICD-10-CM

## 2018-06-21 DIAGNOSIS — I10 Essential (primary) hypertension: Secondary | ICD-10-CM

## 2018-06-21 DIAGNOSIS — I2510 Atherosclerotic heart disease of native coronary artery without angina pectoris: Secondary | ICD-10-CM | POA: Diagnosis not present

## 2018-06-21 NOTE — Patient Instructions (Signed)
Medication Instructions:  The current medical regimen is effective;  continue present plan and medications.  If you need a refill on your cardiac medications before your next appointment, please call your pharmacy.   Follow-Up: Follow up in 1 year with Dr. Hochrein.  You will receive a letter in the mail 2 months before you are due.  Please call us when you receive this letter to schedule your follow up appointment.  Thank you for choosing [facility]!!     

## 2018-07-10 DIAGNOSIS — Z125 Encounter for screening for malignant neoplasm of prostate: Secondary | ICD-10-CM | POA: Diagnosis not present

## 2018-07-10 DIAGNOSIS — Z7189 Other specified counseling: Secondary | ICD-10-CM | POA: Diagnosis not present

## 2018-07-10 DIAGNOSIS — Z0000 Encounter for general adult medical examination without abnormal findings: Secondary | ICD-10-CM | POA: Diagnosis not present

## 2018-07-10 DIAGNOSIS — Z299 Encounter for prophylactic measures, unspecified: Secondary | ICD-10-CM | POA: Diagnosis not present

## 2018-07-10 DIAGNOSIS — Z1331 Encounter for screening for depression: Secondary | ICD-10-CM | POA: Diagnosis not present

## 2018-07-10 DIAGNOSIS — K4090 Unilateral inguinal hernia, without obstruction or gangrene, not specified as recurrent: Secondary | ICD-10-CM | POA: Diagnosis not present

## 2018-07-10 DIAGNOSIS — Z79899 Other long term (current) drug therapy: Secondary | ICD-10-CM | POA: Diagnosis not present

## 2018-07-10 DIAGNOSIS — E7800 Pure hypercholesterolemia, unspecified: Secondary | ICD-10-CM | POA: Diagnosis not present

## 2018-07-10 DIAGNOSIS — Z1339 Encounter for screening examination for other mental health and behavioral disorders: Secondary | ICD-10-CM | POA: Diagnosis not present

## 2018-07-10 DIAGNOSIS — Z1211 Encounter for screening for malignant neoplasm of colon: Secondary | ICD-10-CM | POA: Diagnosis not present

## 2018-07-10 DIAGNOSIS — Z6827 Body mass index (BMI) 27.0-27.9, adult: Secondary | ICD-10-CM | POA: Diagnosis not present

## 2018-07-10 DIAGNOSIS — R5383 Other fatigue: Secondary | ICD-10-CM | POA: Diagnosis not present

## 2018-07-10 DIAGNOSIS — I5022 Chronic systolic (congestive) heart failure: Secondary | ICD-10-CM | POA: Diagnosis not present

## 2018-07-10 DIAGNOSIS — I4891 Unspecified atrial fibrillation: Secondary | ICD-10-CM | POA: Diagnosis not present

## 2018-07-14 ENCOUNTER — Ambulatory Visit: Payer: Self-pay | Admitting: Surgery

## 2018-07-14 DIAGNOSIS — K4090 Unilateral inguinal hernia, without obstruction or gangrene, not specified as recurrent: Secondary | ICD-10-CM | POA: Diagnosis not present

## 2018-07-18 NOTE — Progress Notes (Signed)
06-21-18 (Epic) EKG  08-19-17 (Epic) ECHO

## 2018-07-18 NOTE — Patient Instructions (Addendum)
Adrian Barrett  07/18/2018   Your procedure is scheduled on: 07-25-18    Report to [facility] Main  Entrance    Report to Admitting at 5:30 AM    Call this number if you have problems the morning of surgery [phone]    Remember: Do not eat food or drink liquids :After Midnight. BRUSH YOUR TEETH MORNING OF SURGERY AND RINSE YOUR MOUTH OUT, NO CHEWING GUM CANDY OR MINTS.     Take these medicines the morning of surgery with A SIP OF WATER: Allopurinol (Zyloprim) and Atorvastatin (Lipitor)   DO NOT TAKE ANY DIABETIC MEDICATIONS DAY OF YOUR SURGERY                               You may not have any metal on your body including hair pins and              piercings  Do not wear cologne, lotions, powders or perfumes, deodorant             Men may shave face and neck.   Do not bring valuables to the hospital. [place].  Contacts, dentures or bridgework may not be worn into surgery.  .     Patients discharged the day of surgery will not be allowed to drive home. IF YOU ARE HAVING SURGERY AND GOING HOME THE SAME DAY, YOU MUST HAVE AN ADULT TO DRIVE YOU HOME AND BE WITH YOU FOR 24 HOURS. YOU MAY GO HOME BY TAXI OR UBER OR ORTHERWISE, BUT AN ADULT MUST ACCOMPANY YOU HOME AND STAY WITH YOU FOR 24 HOURS.  Name and phone number of your driver: Adrian Barrett [phone]                Please read over the following fact sheets you were given: _____________________________________________________________________  How to Manage Your Diabetes Before and After Surgery  Why is it important to control my blood sugar before and after surgery? . Improving blood sugar levels before and after surgery helps healing and can limit problems. . A way of improving blood sugar control is eating a healthy diet by: o  Eating less sugar and carbohydrates o  Increasing activity/exercise o  Talking with your doctor about reaching your  blood sugar goals . High blood sugars (greater than 180 mg/dL) can raise your risk of infections and slow your recovery, so you will need to focus on controlling your diabetes during the weeks before surgery. . Make sure that the doctor who takes care of your diabetes knows about your planned surgery including the date and location.  How do I manage my blood sugar before surgery? . Check your blood sugar at least 4 times a day, starting 2 days before surgery, to make sure that the level is not too high or low. o Check your blood sugar the morning of your surgery when you wake up and every 2 hours until you get to the Short Stay unit. . If your blood sugar is less than 70 mg/dL, you will need to treat for low blood sugar: o Do not take insulin. o Treat a low blood sugar (less than 70 mg/dL) with  cup of clear juice (cranberry or apple), 4 glucose tablets,  OR glucose gel. o Recheck blood sugar in 15 minutes after treatment (to make sure it is greater than 70 mg/dL). If your blood sugar is not greater than 70 mg/dL on recheck, call [phone] for further instructions. . Report your blood sugar to the short stay nurse when you get to Short Stay.  . If you are admitted to the hospital after surgery: o Your blood sugar will be checked by the staff and you will probably be given insulin after surgery (instead of oral diabetes medicines) to make sure you have good blood sugar levels. o The goal for blood sugar control after surgery is 80-180 mg/dL.   WHAT DO I DO ABOUT MY DIABETES MEDICATION?  Marland Kitchen Do not take oral diabetes medicines (pills) the morning of surgery.  . THE NIGHT BEFORE SURGERY, take your usual Metformin. However,  DONOT take your Jardiance.      Pati Reviewed and Endorsed by [facility] Patient Education Committee, August 2015           [facility] - Preparing for Surgery Before surgery, you can play an important role.  Because skin is not sterile, your skin needs to be as free  of germs as possible.  You can reduce the number of germs on your skin by washing with CHG (chlorahexidine gluconate) soap before surgery.  CHG is an antiseptic cleaner which kills germs and bonds with the skin to continue killing germs even after washing. Please DO NOT use if you have an allergy to CHG or antibacterial soaps.  If your skin becomes reddened/irritated stop using the CHG and inform your nurse when you arrive at Short Stay. Do not shave (including legs and underarms) for at least 48 hours prior to the first CHG shower.  You may shave your face/neck. Please follow these instructions carefully:  1.  Shower with CHG Soap the night before surgery and the  morning of Surgery.  2.  If you choose to wash your hair, wash your hair first as usual with your  normal  shampoo.  3.  After you shampoo, rinse your hair and body thoroughly to remove the  shampoo.                           4.  Use CHG as you would any other liquid soap.  You can apply chg directly  to the skin and wash                       Gently with a scrungie or clean washcloth.  5.  Apply the CHG Soap to your body ONLY FROM THE NECK DOWN.   Do not use on face/ open                           Wound or open sores. Avoid contact with eyes, ears mouth and genitals (private parts).                       Wash face,  Genitals (private parts) with your normal soap.             6.  Wash thoroughly, paying special attention to the area where your surgery  will be performed.  7.  Thoroughly rinse your body with warm water from the neck down.  8.  DO NOT shower/wash with your normal soap after using and rinsing off  the CHG Soap.                9.  Pat yourself dry with a clean towel.            10.  Wear clean pajamas.            11.  Place clean sheets on your bed the night of your first shower and do not  sleep with pets. Day of Surgery : Do not apply any lotions/deodorants the morning of surgery.  Please wear clean clothes to the  hospital/surgery center.  FAILURE TO FOLLOW THESE INSTRUCTIONS MAY RESULT IN THE CANCELLATION OF YOUR SURGERY PATIENT SIGNATURE_________________________________  NURSE SIGNATURE__________________________________  ________________________________________________________________________

## 2018-07-19 ENCOUNTER — Encounter (HOSPITAL_COMMUNITY): Payer: Self-pay

## 2018-07-19 ENCOUNTER — Encounter (HOSPITAL_COMMUNITY)
Admission: RE | Admit: 2018-07-19 | Discharge: 2018-07-19 | Disposition: A | Discharge status: Home / Self Care | Payer: Medicare Other | Source: Ambulatory Visit | Attending: Surgery | Admitting: Surgery

## 2018-07-19 ENCOUNTER — Other Ambulatory Visit: Payer: Self-pay

## 2018-07-19 ENCOUNTER — Telehealth: Payer: Self-pay | Admitting: Cardiology

## 2018-07-19 DIAGNOSIS — Z01812 Encounter for preprocedural laboratory examination: Secondary | ICD-10-CM | POA: Diagnosis not present

## 2018-07-19 LAB — BASIC METABOLIC PANEL
Anion gap: 8 (ref 5–15)
BUN: 52 mg/dL — ABNORMAL HIGH (ref 8–23)
CO2: 27 mmol/L (ref 22–32)
Calcium: 9.9 mg/dL (ref 8.9–10.3)
Chloride: 104 mmol/L (ref 98–111)
Creatinine, Ser: 1.6 mg/dL — ABNORMAL HIGH (ref 0.61–1.24)
GFR calc non Af Amer: 40 mL/min — ABNORMAL LOW (ref >60)
GFR, EST AFRICAN AMERICAN: 47 mL/min — AB (ref >60)
Glucose, Bld: 207 mg/dL — ABNORMAL HIGH (ref 70–99)
Potassium: 4.6 mmol/L (ref 3.5–5.1)
Sodium: 139 mmol/L (ref 135–145)

## 2018-07-19 LAB — GLUCOSE, CAPILLARY: GLUCOSE-CAPILLARY: 198 mg/dL — AB (ref 70–99)

## 2018-07-19 LAB — HEMOGLOBIN A1C
Hgb A1c MFr Bld: 9.4 % — ABNORMAL HIGH (ref 4.8–5.6)
Mean Plasma Glucose: 223.08 mg/dL

## 2018-07-19 LAB — CBC
HCT: 50 % (ref 39.0–52.0)
Hemoglobin: 16.1 g/dL (ref 13.0–17.0)
MCH: 31.9 pg (ref 26.0–34.0)
MCHC: 32.2 g/dL (ref 30.0–36.0)
MCV: 99 fL (ref 80.0–100.0)
Platelets: 168 10*3/uL (ref 150–400)
RBC: 5.05 MIL/uL (ref 4.22–5.81)
RDW: 13.1 % (ref 11.5–15.5)
WBC: 11.1 10*3/uL — ABNORMAL HIGH (ref 4.0–10.5)
nRBC: 0 % (ref 0.0–0.2)

## 2018-07-19 NOTE — Progress Notes (Signed)
PCP: Monico Blitz  CARDIOLOGIST: Dr. Minus Breeding  INFO IN Epic:EKG (06-21-18)..., ECHO (08-29-17)... and LOV w Cardiologist on 06-21-18  INFO ON CHART: Hx of CHF, CAD, HTN, CABG, Stent, Aortic Valve Insufficiency.  BLOOD THINNERS AND LAST DOSES: Pt is on Coumadin, and Plavix.  ____________________________________  PATIENT SYMPTOMS AT TIME OF PREOP: None note. However, Anesthesia Consult requested related to EF of 30-40, CAD, CHF, and pt being on anticoagulation.

## 2018-07-19 NOTE — Progress Notes (Addendum)
Anesthesia Chart Review   Case:  [id number] Date/Time:  07/25/18 0715   Procedure:  RIGHT HERNIA REPAIR INGUINAL ADULT WITH MESH (Right )   Anesthesia type:  General   Pre-op diagnosis:  RIGHT INGUINAL HERNIA   Location:  [facility]   Surgeon:  [name], MD      DISCUSSION: 78 yo former smoker (quit 07/12/85) with h/o CHF, CAD, A-fib, atrial flutter, stroke (08/1999), DM II, HTN, h/o MI (1987), mitral valve insufficiency, COPD scheduled for above surgery on 07/25/18 with Dr. [name].   Pt underwent cardiac cath 05/16/17. Cath revealed occluded SVGs and a patent LIMA-LAD. He underwent PCI with DES to his native OM and CFX. His LVED was also elevated and he was diuresed before discharge.  The EF was increased to 35 -40% on follow up echo last in Feb.  He was last seen by cardiology on 06/21/18, Dr. Minus Breeding.  At this visit he was asymptomatic.  Per Dr. Percival Spanish pt on Plavix and Coumadin. Cardiologist contacted for clearance and clarification of anticoagulants, pending.  Blood glucose 207 and A1C 9.4 at PST visit 07/19/18.  Creatinine 1.6 at PST visit 07/19/18, appears to be baselind over the last year.  Reviewed by Dr. Harlow Asa.    Pt evaluated by Dr. Christella Hartigan 07/19/2018 during PST visit.    Addendum 07/21/18: I spoke with Dr. Gala Lewandowsky nurse, cardiac clearance has not been received.  She states surgery may be postponed at this time due to no directions for patient to stop anticoagulation at this time.    07/21/18 12:11PM  Per Charlie Pitter, PA-C note regarding cardiac clearance and anticoagulation instructions, "Chart reviewed as part of pre-operative protocol coverage. Patient was contacted 07/21/2018 in reference to pre-operative risk assessment for pending surgery as outlined below.  Adrian Barrett was last seen on 06/21/18 by Dr. Percival Spanish and doing well. He has h/o afib, atrial flutter, CAD s/p CABG, ICM/chronic CHF, stroke, DM, HTN. CHADSVASC is 8 for CHF, HTN, age x2, diabetes, stroke  x2, and vascular disease.  Suspect he will need Lovenox bridging given high CHADSVASC score and stroke. Pharmacy is out today so I will route to Dr. Percival Spanish for urgent input given that procedure is scheduled for 1/14. He is also on Plavix and need input from Dr. Percival Spanish about how long to hold.  Dr. Percival Spanish - please review question about Coumadin and Plavix route response to P CV DIV PREOP (the pre-op pool). Thank you. Of note, it does not appear we manage his Coumadin so this will need to be coordinated with the office that manages this."   Dr. Gala Lewandowsky nurse called and reports Dr. Harlow Asa will most likely postpone surgery at this time to allow for clearance and clear anticoagulation instructions prior to surgery.   07/21/18 2:55PM LOV note from PCP, Dr. Wendie Chess reviewed, at that time pt stable.  Coumadin managed by PCP.   Updated note from Melina Copa, PA-C, "At baseline his revised cardiac risk index is elevated at >11% given his h/o CAD, CHF, and stroke but he does not currently require further testing that would attenuate this risk. It does sound like the hernia is bothering him a great deal. Given past medical history and time since last visit, based on ACC/AHA guidelines, Adrian Barrett would be at acceptable risk for the planned procedure without further cardiovascular testing.   There are several issues at hand here that may require rescheduling of surgery: -  We have  been asked if the patient can hold Plavix. His wife indicates he has already taken his dose today (surgery was planned for 07/25/18). This is typically held for 5 days prior to surgery so surgical date probably needs to be adjusted. However, Dr. Percival Spanish also states, "If we need to hold the Plavix for this procedure he should take ASA" for the duration he is off Plavix. - Given his high CHADSVASC score he requires a Lovenox->Coumadin bridge. I will ask our office staff to call Dr. Trena Platt office to make them aware ASAP but  ultimately this will need to be coordinated with the surgical team depending on the finalized date of procedure."  07/21/2018 4:45PM Spoke with Dr. Gala Lewandowsky nurse who reports pt will hold Plavix and discontinue coumadin.  He will continue ASA per Dr. Percival Spanish.  She reports Dr. Harlow Asa spoke with Dr. Manuella Ghazi who reports no Lovenox bridge needed, continue ASA.   Pt reports last dose of Plavix 07/21/18 and last dose of Coumadin 07/20/2018.  He has continued with ASA.  Pt can proceed with planned procedure barring acute status change.   VS: BP (!) 150/67   Pulse 60   Temp (!) 36.3 C (Oral)   Resp 18   Ht 5\' 10"  (1.778 m)   Wt 81.6 kg   SpO2 100%   BMI 25.83 kg/m   PROVIDERS: Monico Blitz, MD is PCP  Minus Breeding, MD is Cardiologist   LABS: Labs reviewed: Repeat Glucose DOS (all labs ordered are listed, but only abnormal results are displayed)  Labs Reviewed  BASIC METABOLIC PANEL - Abnormal; Notable for the following components:      Result Value   Glucose, Bld 207 (*)    BUN 52 (*)    Creatinine, Ser 1.60 (*)    GFR calc non Af Amer 40 (*)    GFR calc Af Amer 47 (*)    All other components within normal limits  HEMOGLOBIN A1C - Abnormal; Notable for the following components:   Hgb A1c MFr Bld 9.4 (*)    All other components within normal limits  CBC - Abnormal; Notable for the following components:   WBC 11.1 (*)    All other components within normal limits  GLUCOSE, CAPILLARY - Abnormal; Notable for the following components:   Glucose-Capillary 198 (*)    All other components within normal limits     IMAGES:   EKG: 06/21/18 Rate 45 bpm Undetermined rhythm Left axis deviation Left ventricular hypertrophy with QRS widening Nonspecific T wave abnormality   CV: Echo 08/19/17 Study Conclusions  - Left ventricle: The cavity size was normal. There was mild concentric hypertrophy. Systolic function was moderately reduced. The estimated ejection fraction was in the range  of 35% to 40%. Severe hypokinesis of the inferolateral and inferior myocardium; consistent with infarction in the distribution of the right coronary and left circumflex coronary artery. Features are consistent with a pseudonormal left ventricular filling pattern, with concomitant abnormal relaxation and increased filling pressure (grade 2 diastolic dysfunction). Acoustic contrast opacification revealed no evidence ofthrombus. Acoustic contrast opacification revealed no evidence ofthrombus. - Mitral valve: Calcified annulus. There was mild to moderate regurgitation directed centrally. - Left atrium: The atrium was severely dilated. - Right ventricle: The cavity size was dilated. Systolic function was moderately reduced. - Right atrium: The atrium was mildly dilated. - Pulmonary arteries: Systolic pressure was moderately increased. PA peak pressure: 57 mm Hg (S). - Pericardium, extracardiac: A trivial pericardial effusion was identified.  Cardiac Cath 05/16/17 (  Subsequent PCI with DES performed)  LESION #1: Prox Cx lesion is 75% stenosed.  A drug-eluting stent was successfully placed using a STENT SIERRA 3.00 X 12 MM.  Post intervention, there is a 0% residual stenosis.  Using a BALLOON EMERGE MR 2.5X8.  LESION #2: Mid Cx (prox OM) lesion is 85% (focal) after AV Groove branch  A drug-eluting stent was successfully placed using a STENT SIERRA 3.00 X 08 MM.  Post intervention, there is a 5% residual stenosis.  Prox RCA to Dist RCA lesion is 100% stenosed. SVG-RPDA origin to Prox Graft lesion is 100% stenosed.  Prox LAD to Mid LAD lesion is 100% stenosed.  LIMA-dLAD is widely patent with good runoff distally. -Very difficult to engage  SVG-OM & SVG-RI(Diag)Origin to Prox Graft lesion is 100% stenosed.  Hemodynamic findings consistent with severe pulmonary hypertension.  LV end diastolic pressure is moderately elevated. Past Medical History:  Diagnosis Date  . Arthritis    "neck,  lower back" (05/16/2017)  . Atrial fibrillation ([place])   . Atrial flutter ([place])   . Congestive heart failure, unspecified    45 - 50%  . COPD (chronic obstructive pulmonary disease) ([place])   . Coronary atherosclerosis of native coronary artery   . Gout    "on daily RX:" (05/16/2017)  . History of ETOH abuse    Quit 20 years ago.   Marland Kitchen Hyperlipidemia   . Mitral valve insufficiency    Mild to mod on echo 2015  . Myocardial infarction ([place]) 1987  . Obesity   . Stroke [facility]) 01/2000   right posterior middle cerebral artery cerebrovascular accident/notes 51/11/2010; denies residual on 05/16/2017  . Type II diabetes mellitus ([place])   . Unspecified essential hypertension     Past Surgical History:  Procedure Laterality Date  . CARDIAC CATHETERIZATION  1987   "couldn't get thru"  . CAROTID ENDARTERECTOMY Right 01/2000   WITH DACRON PATCH  . CATARACT EXTRACTION W/ INTRAOCULAR LENS  IMPLANT, BILATERAL    . CORONARY ANGIOPLASTY WITH STENT PLACEMENT  05/16/2017  . CORONARY ARTERY BYPASS GRAFT  1999   CABG X 4  . CORONARY STENT INTERVENTION N/A 05/16/2017   Procedure: CORONARY STENT INTERVENTION;  Surgeon: Leonie Man, MD;  Location: [facility];  Service: Cardiovascular;  Laterality: N/A;  . INCISION AND DRAINAGE ABSCESS ANAL  1987  . INGUINAL HERNIA REPAIR Left   . RIGHT/LEFT HEART CATH AND CORONARY/GRAFT ANGIOGRAPHY N/A 05/16/2017   Procedure: RIGHT/LEFT HEART CATH AND CORONARY/GRAFT ANGIOGRAPHY;  Surgeon: Leonie Man, MD;  Location: [place] [facility];  Service: Cardiovascular;  Laterality: N/A;    MEDICATIONS: . albuterol (PROVENTIL HFA;VENTOLIN HFA) 108 (90 Base) MCG/ACT inhaler  . allopurinol (ZYLOPRIM) 300 MG tablet  . atorvastatin (LIPITOR) 40 MG tablet  . clopidogrel (PLAVIX) 75 MG tablet  . furosemide (LASIX) 20 MG tablet  . HYDROcodone-acetaminophen (NORCO/VICODIN) 5-325 MG tablet  . Ibuprofen-diphenhydrAMINE HCl (IBUPROFEN PM) 200-25 MG CAPS  . JARDIANCE 25 MG  TABS tablet  . metFORMIN (GLUCOPHAGE) 500 MG tablet  . nitroGLYCERIN (NITROSTAT) 0.4 MG SL tablet  . Omega-3 Fatty Acids (FISH OIL) 1000 MG CAPS  . sacubitril-valsartan (ENTRESTO) 97-103 MG  . warfarin (COUMADIN) 5 MG tablet   No current facility-administered medications for this encounter.     Maia [facility] Pre-Surgical Testing  [phone] 07/20/2018

## 2018-07-19 NOTE — Telephone Encounter (Signed)
° °  [facility] Pre-operative Risk Assessment    Request for surgical clearance:  1. What type of surgery is being performed?  Hernia Repair   2. When is this surgery scheduled? 07-25-18   3. What type of clearance is required (medical clearance vs. Pharmacy clearance to hold med vs. Both)?Both  4. Are there any medications that need to be held prior to surgery and how long?Coumadin and Plavix- whatever Cardiologist says    5. Practice name and name of physician performing surgery?  Dr [name]   6. What is your office phone number [phone]    7.   What is your office fax number [phone]  8.   Anesthesia type (None, local, MAC, general) ? General   Glyn Ade 07/19/2018, 12:36 PM  _________________________________________________________________   (provider comments below)

## 2018-07-21 NOTE — Telephone Encounter (Signed)
   Primary Cardiologist: Minus Breeding, MD  Chart reviewed as part of pre-operative protocol coverage. Patient was contacted 07/21/2018 in reference to pre-operative risk assessment for pending surgery as outlined below.  Adrian Barrett was last seen on 06/21/18 by Dr. Percival Spanish and doing well. He has h/o afib, atrial flutter, CAD s/p CABG, ICM/chronic CHF, stroke, DM, HTN. CHADSVASC is 8 for CHF, HTN, age x2, diabetes, stroke x2, and vascular disease.  Suspect he will need Lovenox bridging given high CHADSVASC score and stroke. Pharmacy is out today so I will route to Dr. Percival Spanish for urgent input given that procedure is scheduled for 1/14. He is also on Plavix and need input from Dr. Percival Spanish about how long to hold.   Dr. Percival Spanish - please review question about Coumadin and Plavix route response to P CV DIV PREOP (the pre-op pool). Thank you.  Of note, it does not appear we manage his Coumadin so this will need to be coordinated with the office that manages this.   Charlie Pitter, PA-C 07/21/2018, 12:02 PM

## 2018-07-21 NOTE — Telephone Encounter (Signed)
Spoke with Abigail Butts from [place] re: surgical clearance below. She has been made aware that pt will need to take Aspirin while he is holding the Plavix and that pt will need a Lovenox bridge, but we don't manage his Coumadin, Dr. Trena Platt office in [place] does and someone will need to contact their office today to arrange that.  Abigail Butts verbalized understanding. Our clearance / recommendations have been faxed to her at her request, to [phone]

## 2018-07-21 NOTE — Telephone Encounter (Signed)
Tried to call patient 15 mins ago. His wife said he is not home. She is not on his DPR - we do not have one on file, will try again later.

## 2018-07-21 NOTE — Telephone Encounter (Signed)
Follow up   [facility] would like a call back to go over information about patient's coumadin. Please call [phone].

## 2018-07-21 NOTE — Telephone Encounter (Addendum)
   Primary Cardiologist: Adrian Breeding, MD  Chart reviewed as part of pre-operative protocol coverage as below. To recap he has history of afib, atrial flutter, CAD s/p CABG, ICM/chronic CHF, stroke, DM, HTN. CHADSVASC is 8 for CHF, HTN, age x2, diabetes, stroke x2, and vascular disease. He is on warfarin but this is managed through Dr. Trena Platt office in [place].   The patient gave me permission to speak with his wife as well and they affirm he has not had any new heart symptoms - no new chest pain, SOB, palpitations. At baseline his revised cardiac risk index is elevated at >11% given his h/o CAD, CHF, and stroke but he does not currently require further testing that would attenuate this risk. It does sound like the hernia is bothering him a great deal. Given past medical history and time since last visit, based on ACC/AHA guidelines, Adrian Barrett would be at acceptable risk for the planned procedure without further cardiovascular testing.   There are several issues at hand here that may require rescheduling of surgery: -  We have been asked if the patient can hold Plavix. His wife indicates he has already taken his dose today (surgery was planned for 07/25/18). This is typically held for 5 days prior to surgery so surgical date probably needs to be adjusted. However, Dr. Percival Spanish also states, "If we need to hold the Plavix for this procedure he should take ASA" for the duration he is off Plavix. - Given his high CHADSVASC score he requires a Lovenox->Coumadin bridge. I will ask our office staff to call Dr. Trena Platt office to make them aware ASAP but ultimately this will need to be coordinated with the surgical team depending on the finalized date of procedure.  Will route to callback staff to call surgeon to make them aware of the above - I will also route this recommendation to the requesting party via Epic fax function. We ask that the surgical team contact patient with instructions on last dose of  Plavix, first dose of ASA, and plans for Lovenox-Coumadin once surgical date is set in stone.  Please call with questions.  Charlie Pitter, PA-C 07/21/2018, 1:56 PM

## 2018-07-21 NOTE — Telephone Encounter (Signed)
Agree with Lovenox.  Not on ASA.  If we need to hold the Plavix for this procedure he should take ASA.  Hold warfarin as needed with Lovenox bridge.

## 2018-07-24 ENCOUNTER — Encounter (HOSPITAL_COMMUNITY): Payer: Self-pay | Admitting: Surgery

## 2018-07-24 DIAGNOSIS — K4090 Unilateral inguinal hernia, without obstruction or gangrene, not specified as recurrent: Secondary | ICD-10-CM

## 2018-07-24 NOTE — H&P (Signed)
General Surgery [facility]  ALVY ALSOP Documented: 07/14/2018 2:04 PM Location: [facility] Patient #: [id number] DOB: June 13, 1941 Married / Language: Cleophus Molt / Race: White Male   History of Present Illness Earnstine Regal MD; 07/14/2018 2:27 PM) The patient is a 78 year old male who presents with an inguinal hernia.  CHIEF COMPLAINT: right inguinal hernia  Patient is referred by Dr. Manuella Ghazi for surgical evaluation and management of right inguinal hernia. Patient is accompanied by his daughter who is a Marine scientist for Dr. Dayton Martes. Patient noted onset of right inguinal hernia in December 2019. Patient had had a prior left inguinal hernia repair many years ago. He had had no signs of recurrence. Patient was doing some yard work in December and developed a bulge in the right groin. This causes discomfort with physical activity. The bulge reduces completely when the patient is recumbent and the pain goes away. Patient is eating normally and having normal bowel movements. He has had no signs or symptoms of obstruction. He has had no other abdominal surgery. Patient was examined by his primary care physician and referred to surgery for repair. Patient has a significant cardiac history of coronary artery disease and atrial fibrillation. He is currently taking Coumadin and Plavix. His cardiologist is Dr. Marijo File.    Past Surgical History Mammie Lorenzo, LPN; 11/14/2128 8:65 PM) Carotid Artery Surgery  Right. Cataract Surgery  Bilateral. Coronary Artery Bypass Graft  Open Inguinal Hernia Surgery  Left. Vasectomy   Diagnostic Studies History Mammie Lorenzo, LPN; 01/17/4695 2:95 PM) Colonoscopy  never  Medication History Mammie Lorenzo, LPN; 08/19/4130 4:40 PM) Allopurinol (300MG  Tablet, Oral) Active. Atorvastatin Calcium (40MG  Tablet, Oral) Active. Clopidogrel Bisulfate (75MG  Tablet, Oral) Active. Furosemide (40MG  Tablet, Oral)  Active. HYDROcodone-Acetaminophen (5-325MG  Tablet, Oral as needed) Active. Jardiance (25MG  Tablet, Oral) Active. metFORMIN HCl (500MG  Tablet, Oral two times daily) Active. Entresto (97-103MG  Tablet, Oral two times daily) Active. Warfarin Sodium (5MG  Tablet, Oral) Active. (Sunday only) Warfarin Sodium (7.5MG  Tablet, Oral) Active. (Mon, Thurs., and Sat.) Albuterol (90MCG/ACT Aerosol Soln, Inhalation as needed) Active. Fish Oil (1000MG  Capsule DR, Oral) Active. Nitroglycerin (0.4MG  Tab Sublingual, Sublingual as needed) Active. Medications Reconciled  Social History Mammie Lorenzo, LPN; 1/0/2725 3:66 PM) Alcohol use  Remotely quit alcohol use. Caffeine use  Coffee. No drug use  Tobacco use  Former smoker.  Family History Mammie Lorenzo, LPN; 10/13/345 4:25 PM) Diabetes Mellitus  Father. Heart Disease  Father. Hypertension  Father.  Other Problems Mammie Lorenzo, LPN; 03/16/6386 5:64 PM) Arthritis  Atrial Fibrillation  Back Pain  Cerebrovascular Accident  Congestive Heart Failure  Diabetes Mellitus  High blood pressure  Inguinal Hernia  Myocardial infarction  Thyroid Disease     Review of Systems Claiborne Billings Dockery LPN; 09/11/2949 8:84 PM) General Present- Fatigue and Weight Loss. Not Present- Appetite Loss, Chills, Fever, Night Sweats and Weight Gain. Skin Not Present- Change in Wart/Mole, Dryness, Hives, Jaundice, New Lesions, Non-Healing Wounds, Rash and Ulcer. HEENT Present- Hearing Loss. Not Present- Earache, Hoarseness, Nose Bleed, Oral Ulcers, Ringing in the Ears, Seasonal Allergies, Sinus Pain, Sore Throat, Visual Disturbances, Wears glasses/contact lenses and Yellow Eyes. Respiratory Present- Snoring. Not Present- Bloody sputum, Chronic Cough, Difficulty Breathing and Wheezing. Breast Not Present- Breast Mass, Breast Pain, Nipple Discharge and Skin Changes. Cardiovascular Not Present- Chest Pain, Difficulty Breathing Lying Down, Leg Cramps,  Palpitations, Rapid Heart Rate, Shortness of Breath and Swelling of Extremities. Gastrointestinal Not Present- Abdominal Pain, Bloating, Bloody Stool, Change in Bowel Habits,  Chronic diarrhea, Constipation, Difficulty Swallowing, Excessive gas, Gets full quickly at meals, Hemorrhoids, Indigestion, Nausea, Rectal Pain and Vomiting. Male Genitourinary Present- Frequency, Nocturia and Urgency. Not Present- Blood in Urine, Change in Urinary Stream, Impotence, Painful Urination and Urine Leakage. Musculoskeletal Present- Back Pain. Not Present- Joint Pain, Joint Stiffness, Muscle Pain, Muscle Weakness and Swelling of Extremities. Endocrine Present- Cold Intolerance. Not Present- Excessive Hunger, Hair Changes, Heat Intolerance, Hot flashes and New Diabetes. Hematology Present- Blood Thinners and Easy Bruising. Not Present- Excessive bleeding, Gland problems, HIV and Persistent Infections.  Vitals Claiborne Billings Dockery LPN; 02/11/9936 1:69 PM) 07/14/2018 2:05 PM Weight: 182 lb Height: 70in Body Surface Area: 2.01 m Body Mass Index: 26.11 kg/m  Temp.: 97.18F(Temporal)  Pulse: 67 (Regular)  BP: 120/72 (Sitting, Right Arm, Standard)       Physical Exam Earnstine Regal MD; 07/14/2018 2:29 PM) The physical exam findings are as follows: Note:See vital signs recorded above  GENERAL APPEARANCE Development: normal Nutritional status: normal Gross deformities: none  SKIN Rash, lesions, ulcers: none Induration, erythema: none Nodules: none palpable  EYES Conjunctiva and lids: normal Pupils: equal and reactive Iris: normal bilaterally  EARS, NOSE, MOUTH, THROAT External ears: no lesion or deformity External nose: no lesion or deformity Hearing: grossly normal Lips: no lesion or deformity Dentition: normal for age Oral mucosa: moist  NECK Symmetric: yes Trachea: midline Thyroid: no palpable nodules in the thyroid bed  CHEST Respiratory effort: normal Retraction or accessory  muscle use: no Breath sounds: distant Rales, rhonchi, wheeze: none  CARDIOVASCULAR Auscultation: regular rhythm, normal rate Murmurs: grade 2 systolic Pulses: carotid and radial pulse 2+ palpable Lower extremity edema: none Lower extremity varicosities: none  ABDOMEN Distension: none Masses: none palpable Tenderness: none Hepatosplenomegaly: not present Hernia: not present  GENITOURINARY Penis: no lesions Scrotum: no masses Well-healed incision left groin. Palpation in the left inguinal canal with cough and Valsalva shows no sign of recurrence. Obvious bulge in the right groin. Palpation in the inguinal canal shows small to moderate right inguinal hernia which is mildly tender to palpation. It is reducible. It spontaneously prolapses. It augments with cough and Valsalva.  MUSCULOSKELETAL Station and gait: normal Digits and nails: no clubbing or cyanosis Muscle strength: grossly normal all extremities Range of motion: grossly normal all extremities Deformity: none  LYMPHATIC Cervical: none palpable Supraclavicular: none palpable  PSYCHIATRIC Oriented to person, place, and time: yes Mood and affect: normal for situation Judgment and insight: appropriate for situation    Assessment & Plan Earnstine Regal MD; 07/14/2018 2:31 PM) INGUINAL HERNIA OF RIGHT SIDE WITHOUT OBSTRUCTION OR GANGRENE (K40.90) Current Plans Patient is referred from his primary care physician for surgical evaluation and management of right inguinal hernia. Patient is accompanied by his daughter who is a Marine scientist. They are provided with written literature on hernia surgery to review at home.  Patient has a mildly symptomatic right inguinal hernia. We discussed the need for repair using prosthetic mesh. We discussed performing this procedure by open technique. We discussed restrictions on his activities after the surgery. Procedure can be performed as an outpatient surgery if he does well, otherwise we  can observe him overnight at the hospital if necessary. We will require cardiac clearance prior to surgery as he is on anticoagulation and we will require instructions from his cardiologist as to management of his Coumadin and Plavix during the perioperative period. We discussed his postoperative recovery and restrictions on his activities. Patient understands and wishes to proceed with surgery in the near  future.  The risks and benefits of the procedure have been discussed at length with the patient. The patient understands the proposed procedure, potential alternative treatments, and the course of recovery to be expected. All of the patient's questions have been answered at this time. The patient wishes to proceed with surgery.   [name], [place] [facility] Office: [phone]

## 2018-07-25 ENCOUNTER — Encounter (HOSPITAL_COMMUNITY): Payer: Self-pay

## 2018-07-25 ENCOUNTER — Ambulatory Visit (HOSPITAL_COMMUNITY): Payer: Medicare Other | Admitting: Anesthesiology

## 2018-07-25 ENCOUNTER — Ambulatory Visit (HOSPITAL_COMMUNITY)
Admission: RE | Admit: 2018-07-25 | Discharge: 2018-07-25 | Disposition: A | Discharge status: Home / Self Care | Payer: Medicare Other | Attending: Surgery | Admitting: Surgery

## 2018-07-25 ENCOUNTER — Encounter (HOSPITAL_COMMUNITY)
Admission: RE | Disposition: A | Discharge status: Home / Self Care | Payer: Self-pay | Source: Home / Self Care | Attending: Surgery

## 2018-07-25 DIAGNOSIS — I110 Hypertensive heart disease with heart failure: Secondary | ICD-10-CM | POA: Insufficient documentation

## 2018-07-25 DIAGNOSIS — E119 Type 2 diabetes mellitus without complications: Secondary | ICD-10-CM | POA: Diagnosis not present

## 2018-07-25 DIAGNOSIS — I4892 Unspecified atrial flutter: Secondary | ICD-10-CM | POA: Diagnosis not present

## 2018-07-25 DIAGNOSIS — I509 Heart failure, unspecified: Secondary | ICD-10-CM | POA: Insufficient documentation

## 2018-07-25 DIAGNOSIS — E079 Disorder of thyroid, unspecified: Secondary | ICD-10-CM | POA: Diagnosis not present

## 2018-07-25 DIAGNOSIS — I2510 Atherosclerotic heart disease of native coronary artery without angina pectoris: Secondary | ICD-10-CM | POA: Diagnosis not present

## 2018-07-25 DIAGNOSIS — K4090 Unilateral inguinal hernia, without obstruction or gangrene, not specified as recurrent: Secondary | ICD-10-CM

## 2018-07-25 DIAGNOSIS — Z79899 Other long term (current) drug therapy: Secondary | ICD-10-CM | POA: Insufficient documentation

## 2018-07-25 DIAGNOSIS — Z7902 Long term (current) use of antithrombotics/antiplatelets: Secondary | ICD-10-CM | POA: Diagnosis not present

## 2018-07-25 DIAGNOSIS — Z951 Presence of aortocoronary bypass graft: Secondary | ICD-10-CM | POA: Insufficient documentation

## 2018-07-25 DIAGNOSIS — I5041 Acute combined systolic (congestive) and diastolic (congestive) heart failure: Secondary | ICD-10-CM | POA: Diagnosis not present

## 2018-07-25 DIAGNOSIS — I4891 Unspecified atrial fibrillation: Secondary | ICD-10-CM | POA: Insufficient documentation

## 2018-07-25 DIAGNOSIS — Z7984 Long term (current) use of oral hypoglycemic drugs: Secondary | ICD-10-CM | POA: Insufficient documentation

## 2018-07-25 DIAGNOSIS — Z87891 Personal history of nicotine dependence: Secondary | ICD-10-CM | POA: Diagnosis not present

## 2018-07-25 DIAGNOSIS — M1990 Unspecified osteoarthritis, unspecified site: Secondary | ICD-10-CM | POA: Insufficient documentation

## 2018-07-25 HISTORY — PX: INSERTION OF MESH: SHX5868

## 2018-07-25 HISTORY — PX: INGUINAL HERNIA REPAIR: SHX194

## 2018-07-25 LAB — GLUCOSE, CAPILLARY
Glucose-Capillary: 235 mg/dL — ABNORMAL HIGH (ref 70–99)
Glucose-Capillary: 277 mg/dL — ABNORMAL HIGH (ref 70–99)

## 2018-07-25 SURGERY — REPAIR, HERNIA, INGUINAL, ADULT
Anesthesia: General | Site: Inguinal | Laterality: Right

## 2018-07-25 MED ORDER — PHENYLEPHRINE 40 MCG/ML (10ML) SYRINGE FOR IV PUSH (FOR BLOOD PRESSURE SUPPORT)
PREFILLED_SYRINGE | INTRAVENOUS | Status: AC
  Filled 2018-07-25: qty 10

## 2018-07-25 MED ORDER — ROCURONIUM BROMIDE 10 MG/ML (PF) SYRINGE
PREFILLED_SYRINGE | INTRAVENOUS | Status: DC | PRN
  Administered 2018-07-25: 40 mg via INTRAVENOUS
  Administered 2018-07-25: 20 mg via INTRAVENOUS

## 2018-07-25 MED ORDER — FENTANYL CITRATE (PF) 100 MCG/2ML IJ SOLN
INTRAMUSCULAR | Status: AC
  Filled 2018-07-25: qty 2

## 2018-07-25 MED ORDER — CIPROFLOXACIN IN D5W 400 MG/200ML IV SOLN
INTRAVENOUS | Status: AC
  Filled 2018-07-25: qty 200

## 2018-07-25 MED ORDER — FENTANYL CITRATE (PF) 100 MCG/2ML IJ SOLN
INTRAMUSCULAR | Status: DC | PRN
  Administered 2018-07-25 (×2): 50 ug via INTRAVENOUS

## 2018-07-25 MED ORDER — LIDOCAINE 2% (20 MG/ML) 5 ML SYRINGE
INTRAMUSCULAR | Status: AC
  Filled 2018-07-25: qty 5

## 2018-07-25 MED ORDER — BUPIVACAINE HCL (PF) 0.5 % IJ SOLN
INTRAMUSCULAR | Status: AC
  Filled 2018-07-25: qty 30

## 2018-07-25 MED ORDER — CHLORHEXIDINE GLUCONATE CLOTH 2 % EX PADS: 6.0000 | MEDICATED_PAD | Freq: Once | CUTANEOUS | Status: DC

## 2018-07-25 MED ORDER — PHENYLEPHRINE 40 MCG/ML (10ML) SYRINGE FOR IV PUSH (FOR BLOOD PRESSURE SUPPORT)
PREFILLED_SYRINGE | INTRAVENOUS | Status: DC | PRN
  Administered 2018-07-25 (×2): 80 ug via INTRAVENOUS

## 2018-07-25 MED ORDER — OXYCODONE HCL 5 MG PO TABS: 5.0000 mg | ORAL_TABLET | Freq: Once | ORAL | Status: DC | PRN

## 2018-07-25 MED ORDER — LIDOCAINE 2% (20 MG/ML) 5 ML SYRINGE
INTRAMUSCULAR | Status: DC | PRN
  Administered 2018-07-25: 100 mg via INTRAVENOUS

## 2018-07-25 MED ORDER — OXYCODONE HCL 5 MG/5ML PO SOLN
5.0000 mg | Freq: Once | ORAL | Status: DC | PRN
  Filled 2018-07-25: qty 5

## 2018-07-25 MED ORDER — SODIUM CHLORIDE 0.9 % IR SOLN
Status: DC | PRN
  Administered 2018-07-25: 1000 mL

## 2018-07-25 MED ORDER — BUPIVACAINE HCL (PF) 0.5 % IJ SOLN
INTRAMUSCULAR | Status: DC | PRN
  Administered 2018-07-25: 10 mL

## 2018-07-25 MED ORDER — MEPERIDINE HCL 50 MG/ML IJ SOLN: 6.2500 mg | INTRAMUSCULAR | Status: DC | PRN

## 2018-07-25 MED ORDER — SUGAMMADEX SODIUM 200 MG/2ML IV SOLN
INTRAVENOUS | Status: DC | PRN
  Administered 2018-07-25: 200 mg via INTRAVENOUS

## 2018-07-25 MED ORDER — TRAMADOL HCL 50 MG PO TABS: 50.0000 mg | ORAL_TABLET | Freq: Four times a day (QID) | ORAL | 0 refills | Status: DC | PRN

## 2018-07-25 MED ORDER — FENTANYL CITRATE (PF) 100 MCG/2ML IJ SOLN: 25.0000 ug | INTRAMUSCULAR | Status: DC | PRN

## 2018-07-25 MED ORDER — EPHEDRINE SULFATE-NACL 50-0.9 MG/10ML-% IV SOSY
PREFILLED_SYRINGE | INTRAVENOUS | Status: DC | PRN
  Administered 2018-07-25 (×3): 10 mg via INTRAVENOUS

## 2018-07-25 MED ORDER — CIPROFLOXACIN IN D5W 400 MG/200ML IV SOLN
400.0000 mg | INTRAVENOUS | Status: AC
  Administered 2018-07-25: 400 mg via INTRAVENOUS

## 2018-07-25 MED ORDER — BUPIVACAINE-EPINEPHRINE (PF) 0.25% -1:200000 IJ SOLN
INTRAMUSCULAR | Status: AC
  Filled 2018-07-25: qty 30

## 2018-07-25 MED ORDER — ONDANSETRON HCL 4 MG/2ML IJ SOLN
INTRAMUSCULAR | Status: DC | PRN
  Administered 2018-07-25: 4 mg via INTRAVENOUS

## 2018-07-25 MED ORDER — PROMETHAZINE HCL 25 MG/ML IJ SOLN: 6.2500 mg | INTRAMUSCULAR | Status: DC | PRN

## 2018-07-25 MED ORDER — PROPOFOL 10 MG/ML IV BOLUS
INTRAVENOUS | Status: AC
  Filled 2018-07-25: qty 20

## 2018-07-25 MED ORDER — PROPOFOL 10 MG/ML IV BOLUS
INTRAVENOUS | Status: DC | PRN
  Administered 2018-07-25: 160 mg via INTRAVENOUS

## 2018-07-25 MED ORDER — ROCURONIUM BROMIDE 10 MG/ML (PF) SYRINGE
PREFILLED_SYRINGE | INTRAVENOUS | Status: AC
  Filled 2018-07-25: qty 10

## 2018-07-25 MED ORDER — LACTATED RINGERS IV SOLN: INTRAVENOUS | Status: DC

## 2018-07-25 MED ORDER — LACTATED RINGERS IV SOLN
INTRAVENOUS | Status: DC
  Administered 2018-07-25: 1000 mL via INTRAVENOUS
  Administered 2018-07-25: 09:00:00 via INTRAVENOUS

## 2018-07-25 MED ORDER — SUGAMMADEX SODIUM 200 MG/2ML IV SOLN
INTRAVENOUS | Status: AC
  Filled 2018-07-25: qty 2

## 2018-07-25 SURGICAL SUPPLY — 35 items
ADH SKN CLS APL DERMABOND .7 (GAUZE/BANDAGES/DRESSINGS) ×2
BLADE SURG 15 STRL LF DISP TIS (BLADE) ×2 IMPLANT
BLADE SURG 15 STRL SS (BLADE) ×4
CHLORAPREP W/TINT 26ML (MISCELLANEOUS) ×8 IMPLANT
CLOSURE WOUND 1/2 X4 (GAUZE/BANDAGES/DRESSINGS)
COVER SURGICAL LIGHT HANDLE (MISCELLANEOUS) ×4 IMPLANT
COVER WAND RF STERILE (DRAPES) ×4 IMPLANT
DECANTER SPIKE VIAL GLASS SM (MISCELLANEOUS) ×4 IMPLANT
DERMABOND ADVANCED (GAUZE/BANDAGES/DRESSINGS) ×2
DERMABOND ADVANCED .7 DNX12 (GAUZE/BANDAGES/DRESSINGS) IMPLANT
DRAIN PENROSE 18X1/2 LTX STRL (DRAIN) ×4 IMPLANT
DRAPE LAPAROTOMY TRNSV 102X78 (DRAPE) ×4 IMPLANT
ELECT PENCIL ROCKER SW 15FT (MISCELLANEOUS) ×4 IMPLANT
ELECT REM PT RETURN 15FT ADLT (MISCELLANEOUS) ×4 IMPLANT
GLOVE BIOGEL PI IND STRL 7.0 (GLOVE) ×2 IMPLANT
GLOVE BIOGEL PI INDICATOR 7.0 (GLOVE) ×2
GLOVE SURG ORTHO 8.0 STRL STRW (GLOVE) ×4 IMPLANT
GOWN STRL REUS W/TWL XL LVL3 (GOWN DISPOSABLE) ×8 IMPLANT
KIT BASIN OR (CUSTOM PROCEDURE TRAY) ×4 IMPLANT
MESH ULTRAPRO 3X6 7.6X15CM (Mesh General) ×2 IMPLANT
NDL HYPO 25X1 1.5 SAFETY (NEEDLE) ×2 IMPLANT
NEEDLE HYPO 25X1 1.5 SAFETY (NEEDLE) ×4 IMPLANT
NS IRRIG 1000ML POUR BTL (IV SOLUTION) ×4 IMPLANT
PACK BASIC VI WITH GOWN DISP (CUSTOM PROCEDURE TRAY) ×4 IMPLANT
SPONGE LAP 4X18 RFD (DISPOSABLE) ×10 IMPLANT
STRIP CLOSURE SKIN 1/2X4 (GAUZE/BANDAGES/DRESSINGS) ×2 IMPLANT
SUT MNCRL AB 4-0 PS2 18 (SUTURE) ×4 IMPLANT
SUT NOVA NAB GS-21 0 18 T12 DT (SUTURE) IMPLANT
SUT NOVA NAB GS-22 2 0 T19 (SUTURE) ×8 IMPLANT
SUT SILK 2 0 SH (SUTURE) ×4 IMPLANT
SUT VIC AB 3-0 SH 18 (SUTURE) ×6 IMPLANT
SYR BULB IRRIGATION 50ML (SYRINGE) ×4 IMPLANT
SYR CONTROL 10ML LL (SYRINGE) ×4 IMPLANT
TOWEL OR 17X26 10 PK STRL BLUE (TOWEL DISPOSABLE) ×4 IMPLANT
YANKAUER SUCT BULB TIP 10FT TU (MISCELLANEOUS) ×4 IMPLANT

## 2018-07-25 NOTE — Anesthesia Procedure Notes (Signed)
Procedure Name: Intubation Date/Time: 07/25/2018 7:32 AM Performed by: Lind Covert, CRNA Pre-anesthesia Checklist: Patient identified, Emergency Drugs available, Suction available, Patient being monitored and Timeout performed Patient Re-evaluated:Patient Re-evaluated prior to induction Oxygen Delivery Method: Circle system utilized Preoxygenation: Pre-oxygenation with 100% oxygen Induction Type: IV induction Ventilation: Mask ventilation without difficulty Laryngoscope Size: Mac and 4 Grade View: Grade I Tube type: Oral Tube size: 7.5 mm Number of attempts: 1 Airway Equipment and Method: Stylet Placement Confirmation: ETT inserted through vocal cords under direct vision,  positive ETCO2 and breath sounds checked- equal and bilateral Secured at: 22 cm Tube secured with: Tape Dental Injury: Teeth and Oropharynx as per pre-operative assessment

## 2018-07-25 NOTE — Anesthesia Preprocedure Evaluation (Addendum)
Anesthesia Evaluation  Patient identified by MRN, date of birth, ID band Patient awake    Reviewed: Allergy & Precautions, NPO status , Patient's Chart, lab work & pertinent test results  Airway Mallampati: I  TM Distance: >3 FB Neck ROM: Full    Dental  (+) Missing, Dental Advisory Given   Pulmonary COPD, former smoker,    breath sounds clear to auscultation       Cardiovascular hypertension, Pt. on medications + CAD and +CHF  + dysrhythmias Atrial Fibrillation  Rhythm:Irregular Rate:Bradycardia     Neuro/Psych PSYCHIATRIC DISORDERS CVA    GI/Hepatic negative GI ROS, Neg liver ROS,   Endo/Other  diabetes, Type 2, Oral Hypoglycemic Agents  Renal/GU      Musculoskeletal  (+) Arthritis ,   Abdominal Normal abdominal exam  (+)   Peds  Hematology negative hematology ROS (+)   Anesthesia Other Findings   Reproductive/Obstetrics                            Lab Results  Component Value Date   WBC 11.1 (H) 07/19/2018   HGB 16.1 07/19/2018   HCT 50.0 07/19/2018   MCV 99.0 07/19/2018   PLT 168 07/19/2018   Lab Results  Component Value Date   CREATININE 1.60 (H) 07/19/2018   BUN 52 (H) 07/19/2018   NA 139 07/19/2018   K 4.6 07/19/2018   CL 104 07/19/2018   CO2 27 07/19/2018   Lab Results  Component Value Date   INR 1.32 05/17/2017   INR 1.33 05/16/2017   INR 1.9 (H) 05/05/2017   ECho: - Left ventricle: The cavity size was normal. There was mild   concentric hypertrophy. Systolic function was moderately reduced.   The estimated ejection fraction was in the range of 35% to 40%.   Severe hypokinesis of the inferolateral and inferior myocardium;   consistent with infarction in the distribution of the right   coronary and left circumflex coronary artery. Features are   consistent with a pseudonormal left ventricular filling pattern,   with concomitant abnormal relaxation and increased  filling   pressure (grade 2 diastolic dysfunction). Acoustic contrast   opacification revealed no evidence ofthrombus. Acoustic contrast   opacification revealed no evidence ofthrombus. - Mitral valve: Calcified annulus. There was mild to moderate   regurgitation directed centrally. - Left atrium: The atrium was severely dilated. - Right ventricle: The cavity size was dilated. Systolic function   was moderately reduced. - Right atrium: The atrium was mildly dilated. - Pulmonary arteries: Systolic pressure was moderately increased.   PA peak pressure: 57 mm Hg (S). - Pericardium, extracardiac: A trivial pericardial effusion was   identified.  Anesthesia Physical Anesthesia Plan  ASA: III  Anesthesia Plan: General   Post-op Pain Management: GA combined w/ Regional for post-op pain   Induction: Intravenous  PONV Risk Score and Plan: 3 and Ondansetron, Dexamethasone and Midazolam  Airway Management Planned: Oral ETT  Additional Equipment: None  Intra-op Plan:   Post-operative Plan: Extubation in OR  Informed Consent: I have reviewed the patients History and Physical, chart, labs and discussed the procedure including the risks, benefits and alternatives for the proposed anesthesia with the patient or authorized representative who has indicated his/her understanding and acceptance.   Dental advisory given  Plan Discussed with: CRNA  Anesthesia Plan Comments:        Anesthesia Quick Evaluation

## 2018-07-25 NOTE — Anesthesia Postprocedure Evaluation (Signed)
Anesthesia Post Note  Patient: Adrian Barrett  Procedure(s) Performed: RIGHT HERNIA REPAIR INGUINAL ADULT WITH MESH (Right Inguinal) INSERTION OF MESH (Right )     Patient location during evaluation: PACU Anesthesia Type: General Level of consciousness: awake and alert Pain management: pain level controlled Vital Signs Assessment: post-procedure vital signs reviewed and stable Respiratory status: spontaneous breathing, nonlabored ventilation, respiratory function stable and patient connected to nasal cannula oxygen Cardiovascular status: blood pressure returned to baseline and stable Postop Assessment: no apparent nausea or vomiting Anesthetic complications: no    Last Vitals:  Vitals:   07/25/18 0942 07/25/18 1023  BP:  128/69  Pulse:  84  Resp: 14 18  Temp: 36.4 C   SpO2:  97%    Last Pain:  Vitals:   07/25/18 0915  TempSrc:   PainSc: 0-No pain                 Effie Berkshire

## 2018-07-25 NOTE — Transfer of Care (Signed)
Immediate Anesthesia Transfer of Care Note  Patient: Adrian Barrett  Procedure(s) Performed: RIGHT HERNIA REPAIR INGUINAL ADULT WITH MESH (Right Inguinal) INSERTION OF MESH (Right )  Patient Location: PACU  Anesthesia Type:General  Level of Consciousness: sedated  Airway & Oxygen Therapy: Patient Spontanous Breathing and Patient connected to face mask oxygen  Post-op Assessment: Report given to RN and Post -op Vital signs reviewed and stable  Post vital signs: Reviewed and stable  Last Vitals:  Vitals Value Taken Time  BP    Temp    Pulse 72 07/25/2018  8:54 AM  Resp 12 07/25/2018  8:54 AM  SpO2 100 % 07/25/2018  8:54 AM  Vitals shown include unvalidated device data.  Last Pain:  Vitals:   07/25/18 0642  TempSrc:   PainSc: 0-No pain      Patients Stated Pain Goal: 4 (91/66/06 0045)  Complications: No apparent anesthesia complications

## 2018-07-25 NOTE — Op Note (Signed)
Inguinal Hernia, Open, Procedure Note  Pre-operative Diagnosis:  Right inguinal hernia, reducible  Post-operative Diagnosis: same  Surgeon:  Earnstine Regal, MD, FACS  Anesthesia:  General  Preparation:  Chlora-prep  Estimated Blood Loss: minimal  Complications:  none  Indications: The patient presented with a right, reducible hernia.    Procedure Details  The patient was evaluated in the holding area. All of the patient's questions were answered and the proposed procedure was confirmed. The site of the procedure was properly marked. The patient was taken to the Operating Room, identified by name, and the procedure verified as inguinal hernia repair.  The patient was placed in the supine position and underwent induction of anesthesia. A "Time Out" was performed per routine. The lower abdomen and groin were prepped and draped in the usual aseptic fashion.  After ascertaining that an adequate level of anesthesia had been obtained, an incision was made in the groin with a #10 blade.  Dissection was carried through the subcutaneous tissues and hemostasis obtained with the electrocautery.  A Gelpi retractor was placed for exposure.  The external oblique fascia was incised in line with it's fibers and extended through the external inguinal ring.  The cord structures were dissected out of the inguinal canal and encircled with a Penrose drain.  The floor of the inguinal canal was dissected out.  There was no evidence direct hernia defect.  The cord was explored and a large lipoma of the cord was excised.  An indirect hernia sac was found and dissected out up to the level of the internal ring.  A high ligation of the sac was performed with a 2-0 Silk ligature.  The floor of the inguinal canal was reconstructed with Ethicon Ultrapro mesh cut to the appropriate dimensions.  It was secured to the pubic tubercle with a 2-0 Novafil suture and along the inguinal ligament with a running 2-0 Novafil suture.   Mesh was split to accommodate the cord structures.  The superior margin of the mesh was secured to the transversalis and internal oblique musculature with interrupted 2-0 Novafil sutures.  The tails of the mesh were overlapped lateral to the cord structures and secured to the inguinal ligament with interrupted 2-0 Novafil sutures to recreate the internal inguinal ring.  Cord structures were returned to the inguinal canal.  Local anesthetic was infiltrated throughout the field.  External oblique fascia was closed with interrupted 3-0 Vicryl sutures.  Subcutaneous tissues were closed with interrupted 3-0 Vicryl sutures.  Skin was anesthetized with local anesthetic, and the skin edges were re-approximated with a running 4-0 Monocryl suture.  Wound was washed and dried and Dermabond was applied.  Instrument, sponge, and needle counts were correct prior to closure and at the conclusion of the case.  The patient tolerated the procedure well.  The patient was awakened from anesthesia and brought to the recovery room in stable condition.  [name], MD [facility] Office: [phone]

## 2018-07-25 NOTE — Interval H&P Note (Signed)
History and Physical Interval Note:  07/25/2018 7:10 AM  Adrian Barrett  has presented today for surgery, with the diagnosis of RIGHT INGUINAL HERNIA.  The various methods of treatment have been discussed with the patient and family. After consideration of risks, benefits and other options for treatment, the patient has consented to    Procedure(s): RIGHT HERNIA REPAIR INGUINAL ADULT WITH MESH (Right) INSERTION OF MESH (Right) as a surgical intervention .    The patient's history has been reviewed, patient examined, no change in status, stable for surgery.  I have reviewed the patient's chart and labs.  Questions were answered to the patient's satisfaction.    [name], [facility] Office: [place]

## 2018-07-26 ENCOUNTER — Encounter (HOSPITAL_COMMUNITY): Payer: Self-pay | Admitting: Surgery

## 2018-08-10 DIAGNOSIS — Z299 Encounter for prophylactic measures, unspecified: Secondary | ICD-10-CM | POA: Diagnosis not present

## 2018-08-10 DIAGNOSIS — Z87891 Personal history of nicotine dependence: Secondary | ICD-10-CM | POA: Diagnosis not present

## 2018-08-10 DIAGNOSIS — M549 Dorsalgia, unspecified: Secondary | ICD-10-CM | POA: Diagnosis not present

## 2018-08-10 DIAGNOSIS — Z6827 Body mass index (BMI) 27.0-27.9, adult: Secondary | ICD-10-CM | POA: Diagnosis not present

## 2018-08-10 DIAGNOSIS — E1165 Type 2 diabetes mellitus with hyperglycemia: Secondary | ICD-10-CM | POA: Diagnosis not present

## 2018-08-10 DIAGNOSIS — I10 Essential (primary) hypertension: Secondary | ICD-10-CM | POA: Diagnosis not present

## 2018-08-10 DIAGNOSIS — I4891 Unspecified atrial fibrillation: Secondary | ICD-10-CM | POA: Diagnosis not present

## 2018-08-24 DIAGNOSIS — I10 Essential (primary) hypertension: Secondary | ICD-10-CM | POA: Diagnosis not present

## 2018-08-24 DIAGNOSIS — Z299 Encounter for prophylactic measures, unspecified: Secondary | ICD-10-CM | POA: Diagnosis not present

## 2018-08-24 DIAGNOSIS — I5022 Chronic systolic (congestive) heart failure: Secondary | ICD-10-CM | POA: Diagnosis not present

## 2018-08-24 DIAGNOSIS — E1165 Type 2 diabetes mellitus with hyperglycemia: Secondary | ICD-10-CM | POA: Diagnosis not present

## 2018-08-24 DIAGNOSIS — I4891 Unspecified atrial fibrillation: Secondary | ICD-10-CM | POA: Diagnosis not present

## 2018-08-24 DIAGNOSIS — Z6827 Body mass index (BMI) 27.0-27.9, adult: Secondary | ICD-10-CM | POA: Diagnosis not present

## 2018-09-05 DIAGNOSIS — I2510 Atherosclerotic heart disease of native coronary artery without angina pectoris: Secondary | ICD-10-CM | POA: Diagnosis not present

## 2018-09-05 DIAGNOSIS — Z299 Encounter for prophylactic measures, unspecified: Secondary | ICD-10-CM | POA: Diagnosis not present

## 2018-09-05 DIAGNOSIS — Z6828 Body mass index (BMI) 28.0-28.9, adult: Secondary | ICD-10-CM | POA: Diagnosis not present

## 2018-09-05 DIAGNOSIS — R6883 Chills (without fever): Secondary | ICD-10-CM | POA: Diagnosis not present

## 2018-09-05 DIAGNOSIS — I10 Essential (primary) hypertension: Secondary | ICD-10-CM | POA: Diagnosis not present

## 2018-09-05 DIAGNOSIS — I509 Heart failure, unspecified: Secondary | ICD-10-CM | POA: Diagnosis not present

## 2018-09-08 DIAGNOSIS — E119 Type 2 diabetes mellitus without complications: Secondary | ICD-10-CM | POA: Diagnosis present

## 2018-09-08 DIAGNOSIS — I2510 Atherosclerotic heart disease of native coronary artery without angina pectoris: Secondary | ICD-10-CM | POA: Diagnosis present

## 2018-09-08 DIAGNOSIS — I4892 Unspecified atrial flutter: Secondary | ICD-10-CM | POA: Diagnosis not present

## 2018-09-08 DIAGNOSIS — K5730 Diverticulosis of large intestine without perforation or abscess without bleeding: Secondary | ICD-10-CM | POA: Diagnosis not present

## 2018-09-08 DIAGNOSIS — T8141XA Infection following a procedure, superficial incisional surgical site, initial encounter: Secondary | ICD-10-CM | POA: Diagnosis not present

## 2018-09-08 DIAGNOSIS — L03311 Cellulitis of abdominal wall: Secondary | ICD-10-CM | POA: Diagnosis not present

## 2018-09-08 DIAGNOSIS — Z955 Presence of coronary angioplasty implant and graft: Secondary | ICD-10-CM | POA: Diagnosis not present

## 2018-09-08 DIAGNOSIS — Z951 Presence of aortocoronary bypass graft: Secondary | ICD-10-CM | POA: Diagnosis not present

## 2018-09-08 DIAGNOSIS — Z7901 Long term (current) use of anticoagulants: Secondary | ICD-10-CM | POA: Diagnosis not present

## 2018-09-08 DIAGNOSIS — I5022 Chronic systolic (congestive) heart failure: Secondary | ICD-10-CM | POA: Diagnosis present

## 2018-09-08 DIAGNOSIS — J449 Chronic obstructive pulmonary disease, unspecified: Secondary | ICD-10-CM | POA: Diagnosis present

## 2018-09-08 DIAGNOSIS — I110 Hypertensive heart disease with heart failure: Secondary | ICD-10-CM | POA: Diagnosis not present

## 2018-09-08 DIAGNOSIS — K8020 Calculus of gallbladder without cholecystitis without obstruction: Secondary | ICD-10-CM | POA: Diagnosis not present

## 2018-09-08 DIAGNOSIS — L7632 Postprocedural hematoma of skin and subcutaneous tissue following other procedure: Secondary | ICD-10-CM | POA: Diagnosis not present

## 2018-09-08 DIAGNOSIS — L03314 Cellulitis of groin: Secondary | ICD-10-CM | POA: Diagnosis not present

## 2018-09-08 DIAGNOSIS — Z952 Presence of prosthetic heart valve: Secondary | ICD-10-CM | POA: Diagnosis not present

## 2018-09-08 DIAGNOSIS — Z87891 Personal history of nicotine dependence: Secondary | ICD-10-CM | POA: Diagnosis not present

## 2018-09-09 ENCOUNTER — Inpatient Hospital Stay (HOSPITAL_COMMUNITY)
Admission: AD | Admit: 2018-09-09 | Discharge: 2018-09-13 | DRG: 862 | Disposition: A | Discharge status: Home / Self Care | Payer: Medicare Other | Source: Other Acute Inpatient Hospital | Attending: Family Medicine | Admitting: Family Medicine

## 2018-09-09 ENCOUNTER — Inpatient Hospital Stay (HOSPITAL_COMMUNITY): Payer: Medicare Other

## 2018-09-09 DIAGNOSIS — L03311 Cellulitis of abdominal wall: Secondary | ICD-10-CM

## 2018-09-09 DIAGNOSIS — M109 Gout, unspecified: Secondary | ICD-10-CM | POA: Diagnosis present

## 2018-09-09 DIAGNOSIS — K4090 Unilateral inguinal hernia, without obstruction or gangrene, not specified as recurrent: Secondary | ICD-10-CM | POA: Diagnosis not present

## 2018-09-09 DIAGNOSIS — E1169 Type 2 diabetes mellitus with other specified complication: Secondary | ICD-10-CM | POA: Diagnosis not present

## 2018-09-09 DIAGNOSIS — I5042 Chronic combined systolic (congestive) and diastolic (congestive) heart failure: Secondary | ICD-10-CM | POA: Diagnosis present

## 2018-09-09 DIAGNOSIS — I255 Ischemic cardiomyopathy: Secondary | ICD-10-CM | POA: Diagnosis present

## 2018-09-09 DIAGNOSIS — Y838 Other surgical procedures as the cause of abnormal reaction of the patient, or of later complication, without mention of misadventure at the time of the procedure: Secondary | ICD-10-CM | POA: Diagnosis not present

## 2018-09-09 DIAGNOSIS — E1122 Type 2 diabetes mellitus with diabetic chronic kidney disease: Secondary | ICD-10-CM | POA: Diagnosis present

## 2018-09-09 DIAGNOSIS — Z951 Presence of aortocoronary bypass graft: Secondary | ICD-10-CM | POA: Diagnosis not present

## 2018-09-09 DIAGNOSIS — E785 Hyperlipidemia, unspecified: Secondary | ICD-10-CM | POA: Diagnosis present

## 2018-09-09 DIAGNOSIS — I272 Pulmonary hypertension, unspecified: Secondary | ICD-10-CM | POA: Diagnosis present

## 2018-09-09 DIAGNOSIS — E669 Obesity, unspecified: Secondary | ICD-10-CM | POA: Diagnosis present

## 2018-09-09 DIAGNOSIS — Z881 Allergy status to other antibiotic agents status: Secondary | ICD-10-CM | POA: Diagnosis not present

## 2018-09-09 DIAGNOSIS — T8144XA Sepsis following a procedure, initial encounter: Secondary | ICD-10-CM | POA: Diagnosis present

## 2018-09-09 DIAGNOSIS — I5041 Acute combined systolic (congestive) and diastolic (congestive) heart failure: Secondary | ICD-10-CM | POA: Diagnosis not present

## 2018-09-09 DIAGNOSIS — Z791 Long term (current) use of non-steroidal anti-inflammatories (NSAID): Secondary | ICD-10-CM

## 2018-09-09 DIAGNOSIS — T888XXD Other specified complications of surgical and medical care, not elsewhere classified, subsequent encounter: Secondary | ICD-10-CM | POA: Diagnosis not present

## 2018-09-09 DIAGNOSIS — L03314 Cellulitis of groin: Secondary | ICD-10-CM | POA: Diagnosis present

## 2018-09-09 DIAGNOSIS — R6520 Severe sepsis without septic shock: Secondary | ICD-10-CM

## 2018-09-09 DIAGNOSIS — R1909 Other intra-abdominal and pelvic swelling, mass and lump: Secondary | ICD-10-CM | POA: Diagnosis not present

## 2018-09-09 DIAGNOSIS — I2510 Atherosclerotic heart disease of native coronary artery without angina pectoris: Secondary | ICD-10-CM | POA: Diagnosis present

## 2018-09-09 DIAGNOSIS — T888XXA Other specified complications of surgical and medical care, not elsewhere classified, initial encounter: Secondary | ICD-10-CM

## 2018-09-09 DIAGNOSIS — E119 Type 2 diabetes mellitus without complications: Secondary | ICD-10-CM

## 2018-09-09 DIAGNOSIS — I6529 Occlusion and stenosis of unspecified carotid artery: Secondary | ICD-10-CM | POA: Diagnosis present

## 2018-09-09 DIAGNOSIS — Z8249 Family history of ischemic heart disease and other diseases of the circulatory system: Secondary | ICD-10-CM

## 2018-09-09 DIAGNOSIS — N183 Chronic kidney disease, stage 3 (moderate): Secondary | ICD-10-CM | POA: Diagnosis present

## 2018-09-09 DIAGNOSIS — A419 Sepsis, unspecified organism: Secondary | ICD-10-CM | POA: Diagnosis not present

## 2018-09-09 DIAGNOSIS — Z7984 Long term (current) use of oral hypoglycemic drugs: Secondary | ICD-10-CM

## 2018-09-09 DIAGNOSIS — T148XXA Other injury of unspecified body region, initial encounter: Secondary | ICD-10-CM | POA: Diagnosis not present

## 2018-09-09 DIAGNOSIS — L0291 Cutaneous abscess, unspecified: Secondary | ICD-10-CM | POA: Diagnosis not present

## 2018-09-09 DIAGNOSIS — Z79891 Long term (current) use of opiate analgesic: Secondary | ICD-10-CM

## 2018-09-09 DIAGNOSIS — Z955 Presence of coronary angioplasty implant and graft: Secondary | ICD-10-CM | POA: Diagnosis not present

## 2018-09-09 DIAGNOSIS — A401 Sepsis due to streptococcus, group B: Secondary | ICD-10-CM | POA: Diagnosis present

## 2018-09-09 DIAGNOSIS — I130 Hypertensive heart and chronic kidney disease with heart failure and stage 1 through stage 4 chronic kidney disease, or unspecified chronic kidney disease: Secondary | ICD-10-CM | POA: Diagnosis present

## 2018-09-09 DIAGNOSIS — I340 Nonrheumatic mitral (valve) insufficiency: Secondary | ICD-10-CM | POA: Diagnosis present

## 2018-09-09 DIAGNOSIS — Z9842 Cataract extraction status, left eye: Secondary | ICD-10-CM

## 2018-09-09 DIAGNOSIS — I10 Essential (primary) hypertension: Secondary | ICD-10-CM | POA: Diagnosis not present

## 2018-09-09 DIAGNOSIS — Z9861 Coronary angioplasty status: Secondary | ICD-10-CM

## 2018-09-09 DIAGNOSIS — I482 Chronic atrial fibrillation, unspecified: Secondary | ICD-10-CM | POA: Diagnosis not present

## 2018-09-09 DIAGNOSIS — I25110 Atherosclerotic heart disease of native coronary artery with unstable angina pectoris: Secondary | ICD-10-CM | POA: Diagnosis not present

## 2018-09-09 DIAGNOSIS — Z87891 Personal history of nicotine dependence: Secondary | ICD-10-CM

## 2018-09-09 DIAGNOSIS — Z7901 Long term (current) use of anticoagulants: Secondary | ICD-10-CM

## 2018-09-09 DIAGNOSIS — Z8673 Personal history of transient ischemic attack (TIA), and cerebral infarction without residual deficits: Secondary | ICD-10-CM

## 2018-09-09 DIAGNOSIS — L7632 Postprocedural hematoma of skin and subcutaneous tissue following other procedure: Secondary | ICD-10-CM | POA: Diagnosis not present

## 2018-09-09 DIAGNOSIS — I252 Old myocardial infarction: Secondary | ICD-10-CM

## 2018-09-09 DIAGNOSIS — Z9841 Cataract extraction status, right eye: Secondary | ICD-10-CM

## 2018-09-09 DIAGNOSIS — N179 Acute kidney failure, unspecified: Secondary | ICD-10-CM | POA: Diagnosis present

## 2018-09-09 DIAGNOSIS — F0390 Unspecified dementia without behavioral disturbance: Secondary | ICD-10-CM | POA: Diagnosis present

## 2018-09-09 DIAGNOSIS — Z7902 Long term (current) use of antithrombotics/antiplatelets: Secondary | ICD-10-CM

## 2018-09-09 DIAGNOSIS — J449 Chronic obstructive pulmonary disease, unspecified: Secondary | ICD-10-CM | POA: Diagnosis not present

## 2018-09-09 DIAGNOSIS — I5022 Chronic systolic (congestive) heart failure: Secondary | ICD-10-CM | POA: Diagnosis not present

## 2018-09-09 DIAGNOSIS — Z79899 Other long term (current) drug therapy: Secondary | ICD-10-CM

## 2018-09-09 DIAGNOSIS — Z961 Presence of intraocular lens: Secondary | ICD-10-CM | POA: Diagnosis present

## 2018-09-09 DIAGNOSIS — L0390 Cellulitis, unspecified: Secondary | ICD-10-CM | POA: Diagnosis present

## 2018-09-09 DIAGNOSIS — I4892 Unspecified atrial flutter: Secondary | ICD-10-CM | POA: Diagnosis not present

## 2018-09-09 LAB — CBC WITH DIFFERENTIAL/PLATELET
Abs Immature Granulocytes: 0.21 10*3/uL — ABNORMAL HIGH (ref 0.00–0.07)
Basophils Absolute: 0.1 10*3/uL (ref 0.0–0.1)
Basophils Relative: 0 %
Eosinophils Absolute: 0.1 10*3/uL (ref 0.0–0.5)
Eosinophils Relative: 1 %
HCT: 35.7 % — ABNORMAL LOW (ref 39.0–52.0)
Hemoglobin: 11.5 g/dL — ABNORMAL LOW (ref 13.0–17.0)
Immature Granulocytes: 1 %
Lymphocytes Relative: 7 %
Lymphs Abs: 1.3 10*3/uL (ref 0.7–4.0)
MCH: 32.1 pg (ref 26.0–34.0)
MCHC: 32.2 g/dL (ref 30.0–36.0)
MCV: 99.7 fL (ref 80.0–100.0)
MONO ABS: 1.5 10*3/uL — AB (ref 0.1–1.0)
Monocytes Relative: 9 %
Neutro Abs: 14.6 10*3/uL — ABNORMAL HIGH (ref 1.7–7.7)
Neutrophils Relative %: 82 %
Platelets: 261 10*3/uL (ref 150–400)
RBC: 3.58 MIL/uL — ABNORMAL LOW (ref 4.22–5.81)
RDW: 13.6 % (ref 11.5–15.5)
WBC: 17.8 10*3/uL — ABNORMAL HIGH (ref 4.0–10.5)
nRBC: 0 % (ref 0.0–0.2)

## 2018-09-09 LAB — COMPREHENSIVE METABOLIC PANEL
ALT: 29 U/L (ref 0–44)
AST: 38 U/L (ref 15–41)
Albumin: 3 g/dL — ABNORMAL LOW (ref 3.5–5.0)
Alkaline Phosphatase: 128 U/L — ABNORMAL HIGH (ref 38–126)
Anion gap: 8 (ref 5–15)
BUN: 39 mg/dL — ABNORMAL HIGH (ref 8–23)
CO2: 23 mmol/L (ref 22–32)
Calcium: 8.5 mg/dL — ABNORMAL LOW (ref 8.9–10.3)
Chloride: 104 mmol/L (ref 98–111)
Creatinine, Ser: 1.61 mg/dL — ABNORMAL HIGH (ref 0.61–1.24)
GFR calc Af Amer: 46 mL/min — ABNORMAL LOW (ref >60)
GFR calc non Af Amer: 40 mL/min — ABNORMAL LOW (ref >60)
Glucose, Bld: 222 mg/dL — ABNORMAL HIGH (ref 70–99)
Potassium: 4.1 mmol/L (ref 3.5–5.1)
Sodium: 135 mmol/L (ref 135–145)
Total Bilirubin: 1 mg/dL (ref 0.3–1.2)
Total Protein: 7 g/dL (ref 6.5–8.1)

## 2018-09-09 LAB — BLOOD GAS, VENOUS
Acid-Base Excess: 0.3 mmol/L (ref 0.0–2.0)
Bicarbonate: 24 mmol/L (ref 20.0–28.0)
FIO2: 21
O2 Saturation: 46.5 %
Patient temperature: 98
pCO2, Ven: 37.1 mmHg — ABNORMAL LOW (ref 44.0–60.0)
pH, Ven: 7.425 (ref 7.250–7.430)

## 2018-09-09 LAB — TYPE AND SCREEN
ABO/RH(D): A POS
Antibody Screen: NEGATIVE

## 2018-09-09 LAB — GLUCOSE, CAPILLARY
Glucose-Capillary: 190 mg/dL — ABNORMAL HIGH (ref 70–99)
Glucose-Capillary: 204 mg/dL — ABNORMAL HIGH (ref 70–99)

## 2018-09-09 LAB — PROCALCITONIN: Procalcitonin: 0.38 ng/mL

## 2018-09-09 LAB — LACTIC ACID, PLASMA: LACTIC ACID, VENOUS: 1.2 mmol/L (ref 0.5–1.9)

## 2018-09-09 LAB — PROTIME-INR
INR: 2.1 — AB (ref 0.8–1.2)
Prothrombin Time: 23.4 seconds — ABNORMAL HIGH (ref 11.4–15.2)

## 2018-09-09 MED ORDER — ALLOPURINOL 300 MG PO TABS
300.0000 mg | ORAL_TABLET | Freq: Every day | ORAL | Status: DC
  Administered 2018-09-09 – 2018-09-13 (×5): 300 mg via ORAL
  Filled 2018-09-09 (×5): qty 1

## 2018-09-09 MED ORDER — AZTREONAM IN DEXTROSE 2 GM/50ML IV SOLN
2.0000 g | Freq: Once | INTRAVENOUS | Status: DC
  Filled 2018-09-09: qty 50

## 2018-09-09 MED ORDER — VANCOMYCIN HCL IN DEXTROSE 1-5 GM/200ML-% IV SOLN
1000.0000 mg | INTRAVENOUS | Status: DC
  Administered 2018-09-10 – 2018-09-11 (×2): 1000 mg via INTRAVENOUS
  Filled 2018-09-09 (×2): qty 200

## 2018-09-09 MED ORDER — ATORVASTATIN CALCIUM 40 MG PO TABS
40.0000 mg | ORAL_TABLET | Freq: Every day | ORAL | Status: DC
  Administered 2018-09-09 – 2018-09-13 (×5): 40 mg via ORAL
  Filled 2018-09-09 (×5): qty 1

## 2018-09-09 MED ORDER — SODIUM CHLORIDE 0.9 % IV SOLN: 2.0000 g | Freq: Two times a day (BID) | INTRAVENOUS | Status: DC

## 2018-09-09 MED ORDER — SODIUM CHLORIDE 0.9% FLUSH: 3.0000 mL | INTRAVENOUS | Status: DC | PRN

## 2018-09-09 MED ORDER — SODIUM CHLORIDE 0.9 % IV SOLN
2.0000 g | Freq: Once | INTRAVENOUS | Status: AC
  Administered 2018-09-09: 2 g via INTRAVENOUS
  Filled 2018-09-09: qty 2

## 2018-09-09 MED ORDER — METRONIDAZOLE IN NACL 5-0.79 MG/ML-% IV SOLN
500.0000 mg | Freq: Three times a day (TID) | INTRAVENOUS | Status: DC
  Administered 2018-09-10 – 2018-09-13 (×11): 500 mg via INTRAVENOUS
  Filled 2018-09-09 (×12): qty 100

## 2018-09-09 MED ORDER — ONDANSETRON HCL 4 MG PO TABS: 4.0000 mg | ORAL_TABLET | Freq: Four times a day (QID) | ORAL | Status: DC | PRN

## 2018-09-09 MED ORDER — TRAMADOL HCL 50 MG PO TABS: 50.0000 mg | ORAL_TABLET | Freq: Four times a day (QID) | ORAL | Status: DC | PRN

## 2018-09-09 MED ORDER — ONDANSETRON HCL 4 MG/2ML IJ SOLN: 4.0000 mg | Freq: Four times a day (QID) | INTRAMUSCULAR | Status: DC | PRN

## 2018-09-09 MED ORDER — HYDROCODONE-ACETAMINOPHEN 5-325 MG PO TABS: 1.0000 | ORAL_TABLET | Freq: Every day | ORAL | Status: DC | PRN

## 2018-09-09 MED ORDER — SODIUM CHLORIDE 0.9 % IV SOLN: 250.0000 mL | INTRAVENOUS | Status: DC | PRN

## 2018-09-09 MED ORDER — INSULIN ASPART 100 UNIT/ML ~~LOC~~ SOLN
0.0000 [IU] | SUBCUTANEOUS | Status: DC
  Administered 2018-09-09: 3 [IU] via SUBCUTANEOUS
  Administered 2018-09-09: 2 [IU] via SUBCUTANEOUS
  Administered 2018-09-10 (×3): 3 [IU] via SUBCUTANEOUS
  Administered 2018-09-10 (×2): 1 [IU] via SUBCUTANEOUS
  Administered 2018-09-11: 2 [IU] via SUBCUTANEOUS
  Administered 2018-09-11: 3 [IU] via SUBCUTANEOUS
  Administered 2018-09-11: 1 [IU] via SUBCUTANEOUS
  Administered 2018-09-11: 2 [IU] via SUBCUTANEOUS
  Administered 2018-09-11 (×2): 3 [IU] via SUBCUTANEOUS
  Administered 2018-09-12 (×5): 2 [IU] via SUBCUTANEOUS
  Administered 2018-09-12: 3 [IU] via SUBCUTANEOUS
  Administered 2018-09-13 (×2): 2 [IU] via SUBCUTANEOUS
  Administered 2018-09-13: 3 [IU] via SUBCUTANEOUS
  Administered 2018-09-13: 2 [IU] via SUBCUTANEOUS

## 2018-09-09 MED ORDER — IPRATROPIUM BROMIDE 0.02 % IN SOLN: 0.5000 mg | Freq: Four times a day (QID) | RESPIRATORY_TRACT | Status: DC

## 2018-09-09 MED ORDER — ACETAMINOPHEN 325 MG PO TABS: 650.0000 mg | ORAL_TABLET | Freq: Four times a day (QID) | ORAL | Status: DC | PRN

## 2018-09-09 MED ORDER — ACETAMINOPHEN 650 MG RE SUPP: 650.0000 mg | Freq: Four times a day (QID) | RECTAL | Status: DC | PRN

## 2018-09-09 MED ORDER — SACUBITRIL-VALSARTAN 97-103 MG PO TABS
1.0000 | ORAL_TABLET | Freq: Two times a day (BID) | ORAL | Status: DC
  Administered 2018-09-09: 1 via ORAL
  Filled 2018-09-09: qty 1

## 2018-09-09 MED ORDER — HYDROCODONE-ACETAMINOPHEN 5-325 MG PO TABS: 1.0000 | ORAL_TABLET | ORAL | Status: DC | PRN

## 2018-09-09 MED ORDER — VANCOMYCIN HCL 10 G IV SOLR
1750.0000 mg | Freq: Once | INTRAVENOUS | Status: DC
  Filled 2018-09-09: qty 1750

## 2018-09-09 MED ORDER — SODIUM CHLORIDE 0.9% FLUSH: 3.0000 mL | Freq: Two times a day (BID) | INTRAVENOUS | Status: DC

## 2018-09-09 MED ORDER — SODIUM CHLORIDE 0.9 % IV SOLN
2.0000 g | Freq: Three times a day (TID) | INTRAVENOUS | Status: DC
  Administered 2018-09-10 – 2018-09-11 (×4): 2 g via INTRAVENOUS
  Filled 2018-09-09 (×6): qty 2

## 2018-09-09 MED ORDER — ALBUTEROL SULFATE (2.5 MG/3ML) 0.083% IN NEBU: 2.5000 mg | INHALATION_SOLUTION | RESPIRATORY_TRACT | Status: DC | PRN

## 2018-09-09 NOTE — H&P (Addendum)
Adrian Barrett FYB:[id number] DOB: [birth year] DOA: 09/09/2018     PCP: Monico Blitz, MD   Outpatient Specialists:  CARDS:  Dr. Rhae Hammock Gerkin  Patient arrived to ER on  at   Patient coming from: home Lives  With family  Chief Complaint: No chief complaint on file.   HPI: Adrian Barrett is a 78 y.o. male with medical history significant of   diabetes, atrial fibrillation/flutter, coronary artery disease status post stent on chronic anticoagulation, Chronic systolic CHF, COPD, HLD   Presented to  [facility] with complaints of pain and swelling on the right groin. had right inguinal hernia repair on January 14. On presentation, right groin was found to be erythematous, swollen and consistent with findings of cellulitis. Imagings confirmed he has a 10 cm hematoma on that area. Patient was started on empiric broad-spectrum antibiotics. [facility] but they were reluctant to do the surgery and asked the hospitalist team over there to transfer the patient here. Hospitalist from [place] have discussed with general surgery, Dr. Leighton Ruff, who agrees with the transfer and general surgery will start following him here when he comes.  Need to cardiology new patient was breech for his hernia repair with Lovenox his Plavix was held while only on  aspirin  Regarding pertinent Chronic problems:     Last echo feb 2019 Grade 2 diastolic CHF and Systolic CHF EF 35 - 52% on  60 mg of Lasix Etresto CAD He had a cardiac cath 05/16/17. Cath revealed occluded SVGs and a patent LIMA-LAD. He underwent PCI with DES to his native OM and CFX on plavix, lipitor   DM2 - on Metformin  Hx of A. Fib on coumadin  CHA2DS2 - VASc score of 4   While in ER: Blood cultures obtained   Significant initial  Findings: Abnormal Labs Reviewed - No data to display   Lactic Acid, Venous No results found for: LATICACIDVEN  Na 134 K 5.0 - >4.3 Anion Gap 20 - > 19 INR  2.4- > 2.8  Cr today after IV Fluids down to 1.43 Lab Results  Component Value Date   CREATININE 1.60 (H) 07/19/2018   CREATININE 1.36 (H) 02/23/2018   CREATININE 1.50 (H) 02/03/2018    lactic acid 2.2 - 1.4  WBC 15.7- > 16  HG/HCT 12.4 -> 11.3  Down from baseline see below    Component Value Date/Time   HGB 16.1 07/19/2018 0930   HGB 14.0 05/05/2017 1007   HCT 50.0 07/19/2018 0930   HCT 42.2 05/05/2017 1007     Troponin (Point of Care Test) No results for input(s): TROPIPOC in the last 72 hours.  plt 267    BNP (last 3 results) No results for input(s): BNP in the last 8760 hours.  ProBNP (last 3 results) No results for input(s): PROBNP in the last 8760 hours.     UA  not ordered   CTabd/pelvis -groin hematoma measuring up to 10 cm moderate cardiomegaly and dense mitral annular calcification aortic and coronary artery calcific atherosclerosis, cholelithiasis, sigmoid diverticulosis  ECG:  Personally reviewed by me showing: HR : 86  Rhythm: A.fib.   no evidence of ischemic changes      ED Triage Vitals  Enc Vitals Group     BP 09/09/18 2029 (!) 122/59     Pulse Rate 09/09/18 2029 72     Resp 09/09/18 2029 20     Temp 09/09/18 2029 98 F (36.7  C)     Temp Source 09/09/18 2029 Oral     SpO2 09/09/18 2029 95 %     Weight --      Height --      Head Circumference --      Peak Flow --      Pain Score 09/09/18 2007 0     Pain Loc --      Pain Edu? --      Excl. in [place]? --   TMAX(24)@       Latest  Blood pressure (!) 122/59, pulse 72, temperature 98 F (36.7 C), temperature source Oral, resp. rate 20, SpO2 95 %.      Hospitalist was called for transfer for post- operative cellulitis and hematoma   Review of Systems:    Pertinent positives include: groin pain   Constitutional:  No weight loss, night sweats, Fevers, chills, fatigue, weight loss  HEENT:  No headaches, Difficulty swallowing,Tooth/dental problems,Sore throat,  No sneezing, itching,  ear ache, nasal congestion, post nasal drip,  Cardio-vascular:  No chest pain, Orthopnea, PND, anasarca, dizziness, palpitations.no Bilateral lower extremity swelling  GI:  No heartburn, indigestion, abdominal pain, nausea, vomiting, diarrhea, change in bowel habits, loss of appetite, melena, blood in stool, hematemesis Resp:  no shortness of breath at rest. No dyspnea on exertion, No excess mucus, no productive cough, No non-productive cough, No coughing up of blood.No change in color of mucus.No wheezing. Skin:  no rash or lesions. No jaundice GU:  no dysuria, change in color of urine, no urgency or frequency. No straining to urinate.  No flank pain.  Musculoskeletal:  No joint pain or no joint swelling. No decreased range of motion. No back pain.  Psych:  No change in mood or affect. No depression or anxiety. No memory loss.  Neuro: no localizing neurological complaints, no tingling, no weakness, no double vision, no gait abnormality, no slurred speech, no confusion  All systems reviewed and apart from [place] all are negative  Past Medical History:   Past Medical History:  Diagnosis Date  . Arthritis    "neck, lower back" (05/16/2017)  . Atrial fibrillation ([place])   . Atrial flutter ([place])   . Congestive heart failure, unspecified    45 - 50%  . COPD (chronic obstructive pulmonary disease) ([place])   . Coronary atherosclerosis of native coronary artery   . Gout    "on daily RX:" (05/16/2017)  . History of ETOH abuse    Quit 20 years ago.   Marland Kitchen Hyperlipidemia   . Mitral valve insufficiency    Mild to mod on echo 2015  . Myocardial infarction ([place]) 1987  . Obesity   . Stroke [facility]) 01/2000   right posterior middle cerebral artery cerebrovascular accident/notes 51/11/2010; denies residual on 05/16/2017  . Type II diabetes mellitus ([place])   . Unspecified essential hypertension       Past Surgical History:  Procedure Laterality Date  . CARDIAC CATHETERIZATION  1987   "couldn't get  thru"  . CAROTID ENDARTERECTOMY Right 01/2000   WITH DACRON PATCH  . CATARACT EXTRACTION W/ INTRAOCULAR LENS  IMPLANT, BILATERAL    . CORONARY ANGIOPLASTY WITH STENT PLACEMENT  05/16/2017  . CORONARY ARTERY BYPASS GRAFT  1999   CABG X 4  . CORONARY STENT INTERVENTION N/A 05/16/2017   Procedure: CORONARY STENT INTERVENTION;  Surgeon: Leonie Man, MD;  Location: [facility];  Service: Cardiovascular;  Laterality: N/A;  . INCISION AND DRAINAGE ABSCESS ANAL  1987  .  INGUINAL HERNIA REPAIR Left   . INGUINAL HERNIA REPAIR Right 07/25/2018   Procedure: RIGHT HERNIA REPAIR INGUINAL ADULT WITH MESH;  Surgeon: [name], MD;  Location: [facility];  Service: General;  Laterality: Right;  . INSERTION OF MESH Right 07/25/2018   Procedure: INSERTION OF MESH;  Surgeon: [name], MD;  Location: [facility];  Service: General;  Laterality: Right;  . RIGHT/LEFT HEART CATH AND CORONARY/GRAFT ANGIOGRAPHY N/A 05/16/2017   Procedure: RIGHT/LEFT HEART CATH AND CORONARY/GRAFT ANGIOGRAPHY;  Surgeon: Leonie Man, MD;  Location: [facility];  Service: Cardiovascular;  Laterality: N/A;    Social History:  Ambulatory   Independently     reports that he quit smoking about 33 years ago. His smoking use included cigarettes. He has a 64.00 pack-year smoking history. He quit smokeless tobacco use about 32 years ago.  His smokeless tobacco use included chew. He reports that he does not drink alcohol or use drugs.     Family History:   Family History  Problem Relation Age of Onset  . Heart attack Father     Allergies: Allergies  Allergen Reactions  . Augmentin [Amoxicillin-Pot Clavulanate] Other (See Comments)    DID THE REACTION INVOLVE: Swelling of the face/tongue/throat, SOB, or low BP? Unknown Sudden or severe rash/hives, skin peeling, or the inside of the mouth or nose? Unknown Did it require medical treatment? Unknown When did it last happen?unknown If all above answers are "NO",  may proceed with cephalosporin use.      Prior to Admission medications   Medication Sig Start Date End Date Taking? Authorizing Provider  albuterol (PROVENTIL HFA;VENTOLIN HFA) 108 (90 Base) MCG/ACT inhaler Inhale 2 puffs into the lungs every 6 (six) hours as needed for wheezing or shortness of breath.    [provider]  allopurinol (ZYLOPRIM) 300 MG tablet Take 300 mg by mouth daily.      [provider]  atorvastatin (LIPITOR) 40 MG tablet TAKE 1 TABLET BY MOUTH ONCE DAILY 06/05/18   Minus Breeding, MD  clopidogrel (PLAVIX) 75 MG tablet Take 1 tablet (75 mg total) by mouth daily with breakfast. 03/10/18   Minus Breeding, MD  furosemide (LASIX) 20 MG tablet Take 3 tablets (60 mg total) by mouth daily. 11/17/17   Minus Breeding, MD  HYDROcodone-acetaminophen (NORCO/VICODIN) 5-325 MG tablet One tablet every four hours as needed for acute pain.  Limit of five days per [place]. Patient taking differently: Take 1 tablet by mouth daily as needed for moderate pain.  08/26/16   Sanjuana Kava, MD  Ibuprofen-diphenhydrAMINE HCl (IBUPROFEN PM) 200-25 MG CAPS Take 2 tablets by mouth at bedtime as needed (sleep).    [provider]  JARDIANCE 25 MG TABS tablet Take 25 mg by mouth at bedtime.  06/07/18   [provider]  metFORMIN (GLUCOPHAGE) 500 MG tablet Take 500 mg by mouth 2 (two) times daily with a meal.  10/12/14   [provider]  nitroGLYCERIN (NITROSTAT) 0.4 MG SL tablet Place 1 tablet (0.4 mg total) under the tongue every 5 (five) minutes x 3 doses as needed for chest pain. If no relief after the 3rd dose, proceed to the ED for an evaluation 12/30/16 07/17/18  Minus Breeding, MD  Omega-3 Fatty Acids (FISH OIL) 1000 MG CAPS Take 1,000 mg by mouth daily.     [provider]  sacubitril-valsartan (ENTRESTO) 97-103 MG Take 1 tablet by mouth 2 (two) times daily. 04/14/18   Minus Breeding, MD  traMADol (  ULTRAM) 50 MG tablet Take 1-2 tablets  (50-100 mg total) by mouth every 6 (six) hours as needed for moderate pain or severe pain. 07/25/18   [name], MD  warfarin (COUMADIN) 5 MG tablet Take 5-7.5 mg by mouth See admin instructions. Takes 5 mg in the evening on Sun, take 7.5 mg in the evening Mon through Sat    [provider]   Physical Exam: Blood pressure (!) 122/59, pulse 72, temperature 98 F (36.7 C), temperature source Oral, resp. rate 20, SpO2 95 %. 1. General:  in No  Acute distress     Chronically ill -appearing 2. Psychological: Alert and  Oriented 3. Head/ENT:     Dry Mucous Membranes                          Head Non traumatic, neck supple                          Poor Dentition 4. SKIN:  decreased Skin turgor,  Skin clean Dry and intact Redness and swelling around surgical site and extending towards the hip and penile area   5. Heart: Regular rate and rhythm systolic Murmur, no Rub or gallop 6. Lungs:  no wheezes or crackles   distant  7. Abdomen: Soft,  non-tender, Non distended bowel sounds present 8. Lower extremities: no clubbing, cyanosis, no edema 9. Neurologically Grossly intact, moving all 4 extremities equally   10. MSK: Normal range of motion   LABS:    No results for input(s): WBC, NEUTROABS, HGB, HCT, MCV, PLT in the last 168 hours. Basic Metabolic Panel: No results for input(s): NA, K, CL, CO2, GLUCOSE, BUN, CREATININE, CALCIUM, MG, PHOS in the last 168 hours.    No results for input(s): AST, ALT, ALKPHOS, BILITOT, PROT, ALBUMIN in the last 168 hours. No results for input(s): LIPASE, AMYLASE in the last 168 hours. No results for input(s): AMMONIA in the last 168 hours.    HbA1C: No results for input(s): HGBA1C in the last 72 hours. CBG: No results for input(s): GLUCAP in the last 168 hours.    Urine analysis: No results found for: COLORURINE, APPEARANCEUR, LABSPEC, PHURINE, GLUCOSEU, HGBUR, BILIRUBINUR, KETONESUR, PROTEINUR, UROBILINOGEN, NITRITE, LEUKOCYTESUR      Cultures: No results found for: [place], [place], [place], REPTSTATUS   Radiological Exams on Admission: No results found.  Chart has been reviewed    Assessment/Plan  78 y.o. male with medical history significant of   diabetes, atrial fibrillation/flutter, coronary artery disease status post stent on chronic anticoagulation, Chronic systolic CHF, COPD, HLD  Admitted for cellulitis/heamotoma post operatively resulting in sepsis   Present on Admission: Sepsis -  -SIRS criteria met with  elevated white blood cell count,  fever.    With evidence of end organ damage such as  acute renal failure,  elevated lactic acid   -Most likely source being:cellulits   - Obtain serial lactic acid and procalcitonin level.  - Initiate IV antibiotics   - await results of blood  Culture  -Check chest x-ray  - Rehydrate    . Cellulitis -broaden antibiotic coverage as patient continues to have significant abdominal pain and swelling despite treated with vancomycin at outside facility will add Flagyl and aztreonam patient has unknown allergies to Augmentin. Cellulitis most likely in the setting of hematoma . Hematoma -in the setting of patient being on Plavix and Coumadin.  Will hold both for now.  Discussed with surgery who will see patient in consult.  Most likely will need IR drainage  . Essential hypertension -allow some permissive hypertension for tonight suspect sepsis . Coronary artery disease involving native coronary artery of native heart with unstable angina pectoris ([place]), hold Plavix for now restart Lipitor when able Given hematoma we will hold off on Plavix would benefit from cardiology discussion regarding long-term management  . Type 2 diabetes mellitus with hyperlipidemia (HCC) -  - Order Sensitive  SSI   -  check TSH and HgA1C  - Hold by mouth medications    . Chronic atrial fibrillation -           - CHA2DS2 vas score 4 :   anticoagulation  On hold secondary to  bleeding          -  Rate controled          . COPD (chronic obstructive pulmonary disease) (HCC) -chronic issue has albuterol as needed and scheduled Atrovent.  Patient did endorse he have had a bit of a cough lately.  Will check chest x-ray    .   combined systolic and diastolic heart failure ([place]) suspect sepsis etiology of AKI now close to baseline after IVF given at outside hospital .  Continue gentle IV fluids hold off on Lasix avoid fluid overload given difficult to manage severe CHF history and sepsis would benefit from cardiology input. Ok to [employer] for today   CKD stage III - Cr  Stable avoid nephrotoxic medicaitons  Other plan as per orders.  DVT prophylaxis:  SCD    Code Status:  FULL CODE  as per  family  I had personally discussed CODE STATUS with patient and family* I had spent *min discussing goals of care and CODE STATUS  Family Communication:   Family  at  Bedside  plan of care was discussed with   Son, Daughter, Wife,   Disposition Plan:      To home once workup is complete and patient is stable                      Consults called: General Surgery  , email cardiology pls call them in AM to let them know pt has been admitted  Admission status:    inpatient     Expect 2 midnight stay secondary to severity of patient's current illness including      Severe lab/radiological/exam abnormalities including:  hematoma   and extensive comorbidities including: DM2    CHF  CAD   COPD    CKD .  dementia  . Chronic anticoagulation  That are currently affecting medical management.   I expect  patient to be hospitalized for 2 midnights requiring inpatient medical care.  Patient is at high risk for adverse outcome (such as loss of life or disability) if not treated.  Indication for inpatient stay as follows:    severe pain requiring acute inpatient management,       Need for operative/procedural  intervention    Need for IV antibiotics, IV fluids,   IV pain  medications    Level of care     medical floor          Adrian Barrett 09/09/2018, 10:59 PM    [facility]     after 2 AM please page floor coverage PA If 7AM-7PM, please contact the day team taking care of the patient using [email or web address]

## 2018-09-09 NOTE — Progress Notes (Signed)
Pharmacy Antibiotic Note  Adrian Barrett is a 78 y.o. male admitted on 09/09/2018 with Sepsis/wound infection.  Pharmacy has been consulted for Vancomycin/aztreonam dosing.  Plan: Aztreonam 2gm iv q8hr Vancomycin started at [facility] 1.25gm x1, 2/28 @ 1959, then 1gm iv q18hr--last dose per family ~5pm 2/29 but will change to  Vancomycin 1000 mg IV Q 24 hrs. Goal AUC 400-550. Expected AUC: 466 SCr used: 1.61   Weight: 180 lb 12.4 oz (82 kg)(from [facility] documents 2/29)  Temp (24hrs), Avg:98.3 F (36.8 C), Min:98 F (36.7 C), Max:98.5 F (36.9 C)  Recent Labs  Lab 09/09/18 2059 09/09/18 2237  WBC 17.8*  --   CREATININE 1.61*  --   LATICACIDVEN  --  1.2    Estimated Creatinine Clearance: 38.4 mL/min (A) (by C-G formula based on SCr of 1.61 mg/dL (H)).    Allergies  Allergen Reactions  . Augmentin [Amoxicillin-Pot Clavulanate] Other (See Comments)    DID THE REACTION INVOLVE: Swelling of the face/tongue/throat, SOB, or low BP? Unknown Sudden or severe rash/hives, skin peeling, or the inside of the mouth or nose? Unknown Did it require medical treatment? Unknown When did it last happen?unknown If all above answers are "NO", may proceed with cephalosporin use.     Antimicrobials this admission: Vancomycin 09/09/2018 >> Aztreonam 09/09/2018 >>  Flagyl 09/09/2018 >>   Dose adjustments this admission: -  Microbiology results: -  Thank you for allowing pharmacy to be a part of this patient's care.  Nani Skillern Crowford 09/09/2018 11:25 PM

## 2018-09-10 ENCOUNTER — Encounter (HOSPITAL_COMMUNITY): Payer: Self-pay

## 2018-09-10 DIAGNOSIS — I482 Chronic atrial fibrillation, unspecified: Secondary | ICD-10-CM

## 2018-09-10 DIAGNOSIS — T148XXA Other injury of unspecified body region, initial encounter: Secondary | ICD-10-CM

## 2018-09-10 DIAGNOSIS — T888XXA Other specified complications of surgical and medical care, not elsewhere classified, initial encounter: Secondary | ICD-10-CM

## 2018-09-10 DIAGNOSIS — I25110 Atherosclerotic heart disease of native coronary artery with unstable angina pectoris: Secondary | ICD-10-CM

## 2018-09-10 DIAGNOSIS — Z7901 Long term (current) use of anticoagulants: Secondary | ICD-10-CM

## 2018-09-10 DIAGNOSIS — L0291 Cutaneous abscess, unspecified: Secondary | ICD-10-CM

## 2018-09-10 DIAGNOSIS — I10 Essential (primary) hypertension: Secondary | ICD-10-CM

## 2018-09-10 LAB — COMPREHENSIVE METABOLIC PANEL
ALT: 26 U/L (ref 0–44)
AST: 31 U/L (ref 15–41)
Albumin: 2.5 g/dL — ABNORMAL LOW (ref 3.5–5.0)
Alkaline Phosphatase: 100 U/L (ref 38–126)
Anion gap: 9 (ref 5–15)
BUN: 39 mg/dL — ABNORMAL HIGH (ref 8–23)
CO2: 19 mmol/L — ABNORMAL LOW (ref 22–32)
Calcium: 8.1 mg/dL — ABNORMAL LOW (ref 8.9–10.3)
Chloride: 105 mmol/L (ref 98–111)
Creatinine, Ser: 1.38 mg/dL — ABNORMAL HIGH (ref 0.61–1.24)
GFR calc Af Amer: 56 mL/min — ABNORMAL LOW (ref >60)
GFR calc non Af Amer: 48 mL/min — ABNORMAL LOW (ref >60)
Glucose, Bld: 160 mg/dL — ABNORMAL HIGH (ref 70–99)
POTASSIUM: 3.7 mmol/L (ref 3.5–5.1)
Sodium: 133 mmol/L — ABNORMAL LOW (ref 135–145)
Total Bilirubin: 1 mg/dL (ref 0.3–1.2)
Total Protein: 6.1 g/dL — ABNORMAL LOW (ref 6.5–8.1)

## 2018-09-10 LAB — GLUCOSE, CAPILLARY
GLUCOSE-CAPILLARY: 150 mg/dL — AB (ref 70–99)
Glucose-Capillary: 147 mg/dL — ABNORMAL HIGH (ref 70–99)
Glucose-Capillary: 237 mg/dL — ABNORMAL HIGH (ref 70–99)
Glucose-Capillary: 213 mg/dL — ABNORMAL HIGH (ref 70–99)
Glucose-Capillary: 233 mg/dL — ABNORMAL HIGH (ref 70–99)

## 2018-09-10 LAB — CBC
HCT: 31.3 % — ABNORMAL LOW (ref 39.0–52.0)
Hemoglobin: 10.2 g/dL — ABNORMAL LOW (ref 13.0–17.0)
MCH: 31.8 pg (ref 26.0–34.0)
MCHC: 32.6 g/dL (ref 30.0–36.0)
MCV: 97.5 fL (ref 80.0–100.0)
Platelets: 225 10*3/uL (ref 150–400)
RBC: 3.21 MIL/uL — ABNORMAL LOW (ref 4.22–5.81)
RDW: 13.6 % (ref 11.5–15.5)
WBC: 18.2 10*3/uL — ABNORMAL HIGH (ref 4.0–10.5)
nRBC: 0 % (ref 0.0–0.2)

## 2018-09-10 LAB — HEMOGLOBIN A1C
Hgb A1c MFr Bld: 9.1 % — ABNORMAL HIGH (ref 4.8–5.6)
Mean Plasma Glucose: 214.47 mg/dL

## 2018-09-10 LAB — MRSA PCR SCREENING: MRSA by PCR: NEGATIVE

## 2018-09-10 LAB — ABO/RH: ABO/RH(D): A POS

## 2018-09-10 LAB — PHOSPHORUS: Phosphorus: 2.3 mg/dL — ABNORMAL LOW (ref 2.5–4.6)

## 2018-09-10 LAB — LACTIC ACID, PLASMA: Lactic Acid, Venous: 0.8 mmol/L (ref 0.5–1.9)

## 2018-09-10 LAB — MAGNESIUM: MAGNESIUM: 1.8 mg/dL (ref 1.7–2.4)

## 2018-09-10 LAB — TSH: TSH: 1.609 u[IU]/mL (ref 0.350–4.500)

## 2018-09-10 NOTE — Progress Notes (Signed)
Subjective: Pt with min pain  Objective: Vital signs in last 24 hours: Temp:  [98 F (36.7 C)-100.5 F (38.1 C)] 98.9 F (37.2 C) (03/01 0537) Pulse Rate:  [71-86] 71 (03/01 0537) Resp:  [18-20] 20 (03/01 0537) BP: (121-143)/(54-74) 121/54 (03/01 0537) SpO2:  [92 %-99 %] 97 % (03/01 0537) Weight:  [82 kg] 82 kg (02/29 2300)   Intake/Output from previous day: 02/29 0701 - 03/01 0700 In: 840 [P.O.:600; I.V.:40; IV Piggyback:200] Out: 850 [Urine:850] Intake/Output this shift: No intake/output data recorded.   General appearance: alert and cooperative GI: normal findings: soft, non-tender  Incision: erythema and fluctuance noted around surgical incision  Lab Results:  Recent Labs    09/09/18 2059 09/10/18 0407  WBC 17.8* 18.2*  HGB 11.5* 10.2*  HCT 35.7* 31.3*  PLT 261 225   BMET Recent Labs    09/09/18 2059 09/10/18 0407  NA 135 133*  K 4.1 3.7  CL 104 105  CO2 23 19*  GLUCOSE 222* 160*  BUN 39* 39*  CREATININE 1.61* 1.38*  CALCIUM 8.5* 8.1*   PT/INR Recent Labs    09/09/18 2059  LABPROT 23.4*  INR 2.1*   ABG Recent Labs    09/09/18 2237  HCO3 24.0    MEDS, Scheduled . allopurinol  300 mg Oral Daily  . atorvastatin  40 mg Oral Daily  . insulin aspart  0-9 Units Subcutaneous Q4H  . sodium chloride flush  3 mL Intravenous Q12H    Studies/Results: Dg Chest Port 1 View  Result Date: 09/09/2018 CLINICAL DATA:  78 year old male with sepsis. EXAM: PORTABLE CHEST 1 VIEW COMPARISON:  Chest radiograph dated 12/18 7 FINDINGS: There is cardiomegaly with mild vascular congestion. No edema. No focal consolidation, pleural effusion, or pneumothorax. Median sternotomy wires and CABG vascular clips. No acute osseous pathology. IMPRESSION: 1. No acute cardiopulmonary process. 2. Cardiomegaly. Electronically Signed   By: Anner Crete M.D.   On: 09/09/2018 23:28    Assessment: s/p  Patient Active Problem List   Diagnosis Date Noted  . Cellulitis  09/09/2018  . Hematoma 09/09/2018  . Sepsis ([place]) 09/09/2018  . Inguinal hernia of right side without obstruction or gangrene 07/24/2018  . Chronic systolic HF (heart failure) ([place]) 02/01/2018  . Medication management 02/01/2018  . CAD S/P percutaneous coronary angioplasty 05/27/2017  . Chronic anticoagulation 05/27/2017  . Ischemic cardiomyopathy 05/16/2017  . Coronary artery disease involving native coronary artery of native heart with unstable angina pectoris ([place]) 05/16/2017  . Acute combined systolic and diastolic heart failure ([place]) 04/27/2017  . SOB (shortness of breath) 04/22/2017  . Carotid stenosis 12/14/2011  . Fatigue 12/14/2011  . Essential hypertension 12/14/2011  . Obesity, unspecified 09/25/2009  . Non-insulin dependent type 2 diabetes mellitus ([place]) 09/24/2009  . Type 2 diabetes mellitus with hyperlipidemia ([place]) 09/24/2009  . NONDEPENDENT ALCOHOL ABUSE UNSPEC DRUNKENNESS 09/24/2009  . MITRAL VALVE INSUFF&AORTIC VALVE INSUFF 09/24/2009  . DISEASES OF TRICUSPID VALVE 09/24/2009  . Hx of CABG 09/24/2009  . OTHER SPEC FORMS CHRONIC ISCHEMIC HEART DISEASE 09/24/2009  . Chronic atrial fibrillation 09/24/2009  . ATRIAL FLUTTER 09/24/2009  . CHF 09/24/2009  . UNSPECIFIED CEREBROVASCULAR DISEASE 09/24/2009  . COPD (chronic obstructive pulmonary disease) ([place]) 09/24/2009  . POSTSURGICAL AORTOCORONARY BYPASS STATUS 09/24/2009    Most likely with infected hematoma as wbc continues to rise on IV abx  Plan: hold anticoagulation.   Pt is 6 wks out from surgery and hematoma probably liquefied by now.   Once INR back to normal,  rec laterally placed US guided drain and cultures.   May need further operative intervention if fluid collection doesn't adequately drain.   LOS: 1 day     .Rosario Adie, [facility], [place]   09/10/2018 9:12 AM

## 2018-09-10 NOTE — H&P (Signed)
Chief Complaint: Right groin fluid collection  Referring Physician(s): Leighton Ruff  Supervising Physician: Arne Cleveland  Patient Status: [facility] - In-pt  History of Present Illness: Adrian Barrett is a 78 y.o. male who is 6 weeks post op from a right inguinal hernia repair.  He is on chronic anticoagulation with Warfarin for A-fib and Plavix for CAD w/ h/o stents and CABG.  He presented to [facility] in [place] with complaints of pain and swelling on the right groin.  In the ED the right groin was found to be erythematous, swollen and consistent with findings of cellulitis.   Imaging confirmed he has a 10 cm hematoma.  He has been started on empiric broad-spectrum antibiotics.  He was transferred here for care.  We are asked to evaluate him for aspiration versus drain placement.  INR is currently 2. Last dose of Warfarin was Friday and also last dose of Plavix was Friday.  No nausea/vomiting. No Fever/chills. ROS negative.   Past Medical History:  Diagnosis Date  . Arthritis    "neck, lower back" (05/16/2017)  . Atrial fibrillation ([place])   . Atrial flutter ([place])   . Congestive heart failure, unspecified    45 - 50%  . COPD (chronic obstructive pulmonary disease) ([place])   . Coronary atherosclerosis of native coronary artery   . Gout    "on daily RX:" (05/16/2017)  . History of ETOH abuse    Quit 20 years ago.   Marland Kitchen Hyperlipidemia   . Mitral valve insufficiency    Mild to mod on echo 2015  . Myocardial infarction ([place]) 1987  . Obesity   . Stroke [facility]) 01/2000   right posterior middle cerebral artery cerebrovascular accident/notes 51/11/2010; denies residual on 05/16/2017  . Type II diabetes mellitus ([place])   . Unspecified essential hypertension     Past Surgical History:  Procedure Laterality Date  . CARDIAC CATHETERIZATION  1987   "couldn't get thru"  . CAROTID ENDARTERECTOMY Right 01/2000   WITH DACRON PATCH  . CATARACT EXTRACTION W/  INTRAOCULAR LENS  IMPLANT, BILATERAL    . CORONARY ANGIOPLASTY WITH STENT PLACEMENT  05/16/2017  . CORONARY ARTERY BYPASS GRAFT  1999   CABG X 4  . CORONARY STENT INTERVENTION N/A 05/16/2017   Procedure: CORONARY STENT INTERVENTION;  Surgeon: Leonie Man, MD;  Location: [facility];  Service: Cardiovascular;  Laterality: N/A;  . INCISION AND DRAINAGE ABSCESS ANAL  1987  . INGUINAL HERNIA REPAIR Left   . INGUINAL HERNIA REPAIR Right 07/25/2018   Procedure: RIGHT HERNIA REPAIR INGUINAL ADULT WITH MESH;  Surgeon: [name], MD;  Location: [facility];  Service: General;  Laterality: Right;  . INSERTION OF MESH Right 07/25/2018   Procedure: INSERTION OF MESH;  Surgeon: [name], MD;  Location: [facility];  Service: General;  Laterality: Right;  . RIGHT/LEFT HEART CATH AND CORONARY/GRAFT ANGIOGRAPHY N/A 05/16/2017   Procedure: RIGHT/LEFT HEART CATH AND CORONARY/GRAFT ANGIOGRAPHY;  Surgeon: Leonie Man, MD;  Location: [facility];  Service: Cardiovascular;  Laterality: N/A;    Allergies: Augmentin [amoxicillin-pot clavulanate]  Medications: Prior to Admission medications   Medication Sig Start Date End Date Taking? Authorizing Provider  albuterol (PROVENTIL HFA;VENTOLIN HFA) 108 (90 Base) MCG/ACT inhaler Inhale 2 puffs into the lungs every 6 (six) hours as needed for wheezing or shortness of breath.   Yes [provider]  allopurinol (ZYLOPRIM) 300 MG tablet Take 300 mg by mouth daily.  Yes [provider]  atorvastatin (LIPITOR) 40 MG tablet TAKE 1 TABLET BY MOUTH ONCE DAILY Patient taking differently: Take 40 mg by mouth daily.  06/05/18  Yes Minus Breeding, MD  clopidogrel (PLAVIX) 75 MG tablet Take 1 tablet (75 mg total) by mouth daily with breakfast. 03/10/18  Yes Minus Breeding, MD  furosemide (LASIX) 20 MG tablet Take 3 tablets (60 mg total) by mouth daily. 11/17/17  Yes Minus Breeding, MD  glyBURIDE (DIABETA) 5 MG tablet Take 5 mg by mouth at  bedtime.   Yes [provider]  HYDROcodone-acetaminophen (NORCO/VICODIN) 5-325 MG tablet One tablet every four hours as needed for acute pain.  Limit of five days per [place]. Patient taking differently: Take 1 tablet by mouth daily as needed for moderate pain.  08/26/16  Yes Sanjuana Kava, MD  Ibuprofen-diphenhydrAMINE HCl (IBUPROFEN PM) 200-25 MG CAPS Take 2 tablets by mouth at bedtime as needed (sleep).   Yes [provider]  JARDIANCE 25 MG TABS tablet Take 25 mg by mouth at bedtime.  06/07/18  Yes [provider]  metFORMIN (GLUCOPHAGE) 500 MG tablet Take 500-1,000 mg by mouth See admin instructions. Pt takes 1000mg  AM and 500mg  PM 10/12/14  Yes [provider]  nitroGLYCERIN (NITROSTAT) 0.4 MG SL tablet Place 1 tablet (0.4 mg total) under the tongue every 5 (five) minutes x 3 doses as needed for chest pain. If no relief after the 3rd dose, proceed to the ED for an evaluation 12/30/16 09/09/18 Yes Hochrein, Jeneen Rinks, MD  Omega-3 Fatty Acids (FISH OIL) 1000 MG CAPS Take 1,000 mg by mouth daily.    Yes [provider]  sacubitril-valsartan (ENTRESTO) 97-103 MG Take 1 tablet by mouth 2 (two) times daily. 04/14/18  Yes Minus Breeding, MD  warfarin (COUMADIN) 5 MG tablet Take 5-7.5 mg by mouth See admin instructions. Takes 5 mg in the evening on Sun, take 7.5 mg in the evening Mon through Sat   Yes [provider]     Family History  Problem Relation Age of Onset  . Heart attack Father     Social History   Socioeconomic History  . Marital status: Married    Spouse name: Not on file  . Number of children: Not on file  . Years of education: Not on file  . Highest education level: Not on file  Occupational History  . Occupation: DISABLED  Social Needs  . Financial resource strain: Not on file  . Food insecurity:    Worry: Not on file    Inability: Not on file  . Transportation needs:    Medical: Not on file    Non-medical: Not on  file  Tobacco Use  . Smoking status: Former Smoker    Packs/day: 2.00    Years: 32.00    Pack years: 64.00    Types: Cigarettes    Last attempt to quit: 07/12/1985    Years since quitting: 33.1  . Smokeless tobacco: Former Systems developer    Types: [place] date: 07/12/1986  Substance and Sexual Activity  . Alcohol use: No    Frequency: Never    Comment: [place] EtOH; "stopped in the 1990s"  . Drug use: No  . Sexual activity: Never  Lifestyle  . Physical activity:    Days per week: Not on file    Minutes per session: Not on file  . Stress: Not on file  Relationships  . Social connections:    Talks on phone: Not  on file    Gets together: Not on file    Attends religious service: Not on file    Active member of club or organization: Not on file    Attends meetings of clubs or organizations: Not on file    Relationship status: Not on file  Other Topics Concern  . Not on file  Social History Narrative   DISABLED FROM [employer] AFTER A STROKE IN 2001           Review of Systems: A 12 point ROS discussed and pertinent positives are indicated in the HPI above.  All other systems are negative.  Review of Systems  Vital Signs: BP (!) 121/54 (BP Location: Right Arm)   Pulse 71   Temp 98.9 F (37.2 C) (Oral)   Resp 20   Wt 82 kg Comment: from [place] documents 2/29  SpO2 97%   BMI 25.94 kg/m   Physical Exam Vitals signs reviewed.  Constitutional:      Appearance: Normal appearance.  HENT:     Head: Normocephalic and atraumatic.  Eyes:     Extraocular Movements: Extraocular movements intact.  Neck:     Musculoskeletal: Normal range of motion.  Cardiovascular:     Rate and Rhythm: Normal rate.  Pulmonary:     Effort: Pulmonary effort is normal. No respiratory distress.     Breath sounds: Normal breath sounds.  Musculoskeletal: Normal range of motion.  Skin:    Comments: See photo below  Neurological:     General: No focal deficit present.     Mental  Status: He is alert and oriented to person, place, and time.  Psychiatric:        Mood and Affect: Mood normal.        Behavior: Behavior normal.        Thought Content: Thought content normal.        Judgment: Judgment normal.     Imaging: Dg Chest Port 1 View  Result Date: 09/09/2018 CLINICAL DATA:  78 year old male with sepsis. EXAM: PORTABLE CHEST 1 VIEW COMPARISON:  Chest radiograph dated 12/18 7 FINDINGS: There is cardiomegaly with mild vascular congestion. No edema. No focal consolidation, pleural effusion, or pneumothorax. Median sternotomy wires and CABG vascular clips. No acute osseous pathology. IMPRESSION: 1. No acute cardiopulmonary process. 2. Cardiomegaly. Electronically Signed   By: Anner Crete M.D.   On: 09/09/2018 23:28    Labs:  CBC: Recent Labs    07/19/18 0930 09/09/18 2059 09/10/18 0407  WBC 11.1* 17.8* 18.2*  HGB 16.1 11.5* 10.2*  HCT 50.0 35.7* 31.3*  PLT 168 261 225    COAGS: Recent Labs    09/09/18 2059  INR 2.1*    BMP: Recent Labs    02/23/18 0841 07/19/18 0930 09/09/18 2059 09/10/18 0407  NA 141 139 135 133*  K 4.6 4.6 4.1 3.7  CL 102 104 104 105  CO2 26 27 23  19*  GLUCOSE 220* 207* 222* 160*  BUN 40* 52* 39* 39*  CALCIUM 9.4 9.9 8.5* 8.1*  CREATININE 1.36* 1.60* 1.61* 1.38*  GFRNONAA 49* 40* 40* 48*  GFRAA 57* 47* 46* 56*    LIVER FUNCTION TESTS: Recent Labs    02/03/18 0802 09/09/18 2059 09/10/18 0407  BILITOT 0.5 1.0 1.0  AST 34 38 31  ALT 32 29 26  ALKPHOS 110 128* 100  PROT 7.4 7.0 6.1*  ALBUMIN 4.6 3.0* 2.5*    TUMOR MARKERS: No results for input(s): AFPTM, CEA, CA199, CHROMGRNA  in the last 8760 hours.  Assessment and Plan:  6 weeks post op from a right inguinal hernia repair.  Right groin erythematous, swollen and consistent with findings of cellulitis.   Imaging confirmed a 10 cm hematoma.  Images reviewed by Dr. Vernard Gambles. He feels it is fairly superficial and ok to proceed even though Plavix held  only since Friday. and as long as INR is 1.5.  Will proceed with image guided aspiration versus drain placement tomorrow and as long as INR is 1.5 or less.   Thank you for this interesting consult.  I greatly enjoyed meeting Adrian Barrett and look forward to participating in their care.  A copy of this report was sent to the requesting provider on this date.  Electronically Signed: Murrell Redden, PA-C   09/10/2018, 12:52 PM      I spent a total of 40 Minutes    in face to face in clinical consultation, greater than 50% of which was counseling/coordinating care for aspiration / drain right groin post surgical fluid collection.

## 2018-09-10 NOTE — Progress Notes (Signed)
PROGRESS NOTE    TARVARES LANT  RCB:[id number] DOB: 06-Aug-1940 DOA: 09/09/2018 PCP: Monico Blitz, MD  Brief Narrative:78 y.o. male with medical history significant of   diabetes, atrial fibrillation/flutter, coronary artery disease status post stent on chronic anticoagulation, Chronic systolic CHF, COPD, HLD   Presented to  [facility] with complaints of pain and swelling on the right groin. had right inguinal hernia repair on January 14. On presentation, right groin was found to be erythematous, swollen and consistent with findings of cellulitis. Imagings confirmed he has a 10 cm hematoma on that area. Patient was started on empiric broad-spectrum antibiotics. [facility] but they were reluctant to do the surgery and asked the hospitalist team over there to transfer the patient here. Hospitalist from [place] have discussed with general surgery, Dr. Leighton Ruff, who agrees with the transfer and general surgery will start following him here when he comes.  Need to cardiology new patient was breech for his hernia repair with Lovenox his Plavix was held while only on  aspirin  Regarding pertinent Chronic problems:     Last echo feb 2019 Grade 2 diastolic CHF and Systolic CHF EF 35 - 80% on  60 mg of Lasix Etresto CAD He had a cardiac cath 05/16/17. Cath revealed occluded SVGs and a patent LIMA-LAD. He underwent PCI with DES to his native OM and CFX on plavix, lipitor   DM2 - on Metformin  Hx of A. Fib on coumadin CHA2DS2 - VASc score of 4   Assessment & Plan:   Active Problems:   Non-insulin dependent type 2 diabetes mellitus (HCC)   Type 2 diabetes mellitus with hyperlipidemia (HCC)   Chronic atrial fibrillation   COPD (chronic obstructive pulmonary disease) (HCC)   Essential hypertension   Acute combined systolic and diastolic heart failure (HCC)   Coronary artery disease involving native coronary artery of native heart with unstable angina  pectoris (HCC)   Chronic anticoagulation   Cellulitis   Hematoma   Sepsis (HCC)   Cellulitis/Hematoma -right lower quadrant 6 weeks out of right inguinal hernia repair  surgery patient with fever leukocytosis edema erythema and fluctuance at the site of incision.  Continue IV antibiotics followed by surgery.  Plan is to consult IR to place a drain once INR is low.hold plavix and coumadin.  HTN SOFT continue to monitor off antihypertensives  TYPE 2 DM takes jardiance glyburide  and metformin at home  CHRONIC AFIB rate controlled   COMBINED SYSTOLIC AND DIASTOLIC HEART FAILURE-on lasix 60 mg daily   Gout on allopurinol  hyperlipedemia on lipitor      Estimated body mass index is 25.94 kg/m as calculated from the following:   Height as of 07/25/18: 5\' 10"  (1.778 m).   Weight as of this encounter: 82 kg.  DVT prophylaxis: scd Code Status: full Family Communication: dw wife Disposition Plan:  Pending clinical improvement  Consultants: surgery   Procedures: none Antimicrobials: azabactam vanco Subjective:  Resting in bed wife by the bed side Objective: Vitals:   09/10/18 0207 09/10/18 0324 09/10/18 0400 09/10/18 0537  BP: 122/74   (!) 121/54  Pulse: 80   71  Resp: 18   20  Temp: (!) 100.5 F (38.1 C) 100.3 F (37.9 C) 99.4 F (37.4 C) 98.9 F (37.2 C)  TempSrc: Oral Oral Oral Oral  SpO2: 92%   97%  Weight:        Intake/Output Summary (Last 24 hours) at 09/10/2018 0932 Last data filed at  09/10/2018 0622 Gross per 24 hour  Intake 840 ml  Output 850 ml  Net -10 ml   Filed Weights   09/09/18 2300  Weight: 82 kg    Examination:  General exam: Appears calm and comfortable  Respiratory system: Clear to auscultation. Respiratory effort normal. Cardiovascular system: S1 & S2 heard, RRR. No JVD, murmurs, rubs, gallops or clicks. No pedal edema. Gastrointestinal system: Abdomen is nondistended, soft and nontender. No organomegaly or masses felt. Normal bowel  sounds heard.RLQ incision swollen erythema tender  Central nervous system: Alert and oriented. No focal neurological deficits. Extremities: Symmetric 5 x 5 power. Skin: No rashes, lesions or ulcers Psychiatry: Judgement and insight appear normal. Mood & affect appropriate.     Data Reviewed: I have personally reviewed following labs and imaging studies  CBC: Recent Labs  Lab 09/09/18 2059 09/10/18 0407  WBC 17.8* 18.2*  NEUTROABS 14.6*  --   HGB 11.5* 10.2*  HCT 35.7* 31.3*  MCV 99.7 97.5  PLT 261 093   Basic Metabolic Panel: Recent Labs  Lab 09/09/18 2059 09/10/18 0407  NA 135 133*  K 4.1 3.7  CL 104 105  CO2 23 19*  GLUCOSE 222* 160*  BUN 39* 39*  CREATININE 1.61* 1.38*  CALCIUM 8.5* 8.1*  MG  --  1.8  PHOS  --  2.3*   GFR: Estimated Creatinine Clearance: 44.8 mL/min (A) (by C-G formula based on SCr of 1.38 mg/dL (H)). Liver Function Tests: Recent Labs  Lab 09/09/18 2059 09/10/18 0407  AST 38 31  ALT 29 26  ALKPHOS 128* 100  BILITOT 1.0 1.0  PROT 7.0 6.1*  ALBUMIN 3.0* 2.5*   No results for input(s): LIPASE, AMYLASE in the last 168 hours. No results for input(s): AMMONIA in the last 168 hours. Coagulation Profile: Recent Labs  Lab 09/09/18 2059  INR 2.1*   Cardiac Enzymes: No results for input(s): CKTOTAL, CKMB, CKMBINDEX, TROPONINI in the last 168 hours. BNP (last 3 results) No results for input(s): PROBNP in the last 8760 hours. HbA1C: Recent Labs    09/09/18 2059  HGBA1C 9.1*   CBG: Recent Labs  Lab 09/09/18 2057 09/09/18 2337 09/10/18 0352 09/10/18 0804  GLUCAP 204* 190* 147* 150*   Lipid Profile: No results for input(s): CHOL, HDL, LDLCALC, TRIG, CHOLHDL, LDLDIRECT in the last 72 hours. Thyroid Function Tests: Recent Labs    09/10/18 0407  TSH 1.609   Anemia Panel: No results for input(s): VITAMINB12, FOLATE, FERRITIN, TIBC, IRON, RETICCTPCT in the last 72 hours. Sepsis Labs: Recent Labs  Lab 09/09/18 2237  09/10/18 0407  PROCALCITON 0.38  --   LATICACIDVEN 1.2 0.8    Recent Results (from the past 240 hour(s))  MRSA PCR Screening     Status: None   Collection Time: 09/10/18 12:01 AM  Result Value Ref Range Status   MRSA by PCR NEGATIVE NEGATIVE Final    Comment:        The GeneXpert MRSA Assay (FDA approved for NASAL specimens only), is one component of a comprehensive MRSA colonization surveillance program. It is not intended to diagnose MRSA infection nor to guide or monitor treatment for MRSA infections. Performed at [facility], [place] [address], [place], [place] [address]          Radiology Studies: Dg Chest Port 1 View  Result Date: 09/09/2018 CLINICAL DATA:  78 year old male with sepsis. EXAM: PORTABLE CHEST 1 VIEW COMPARISON:  Chest radiograph dated 12/18 7 FINDINGS: There is cardiomegaly with mild  vascular congestion. No edema. No focal consolidation, pleural effusion, or pneumothorax. Median sternotomy wires and CABG vascular clips. No acute osseous pathology. IMPRESSION: 1. No acute cardiopulmonary process. 2. Cardiomegaly. Electronically Signed   By: Anner Crete M.D.   On: 09/09/2018 23:28        Scheduled Meds: . allopurinol  300 mg Oral Daily  . atorvastatin  40 mg Oral Daily  . insulin aspart  0-9 Units Subcutaneous Q4H  . sodium chloride flush  3 mL Intravenous Q12H   Continuous Infusions: . sodium chloride    . aztreonam 2 g (09/10/18 0524)  . metronidazole 500 mg (09/10/18 0610)  . vancomycin       LOS: 1 day      Georgette Shell, MD [facility]  If 7PM-7AM, please contact night-coverage [email or web address] Password [facility] 09/10/2018, 9:32 AM

## 2018-09-10 NOTE — Consult Note (Signed)
Admit date: 09/09/2018 Referring Physician Dr. Jacki Cones Primary Physician Dr. Russella Dar Primary Cardiologist Dr. Minus Breeding Reason for Consultation  Guidance with anticoagulation  HPI: Adrian Barrett is a 78 y.o. male who is being seen today for the evaluation of guidance with anticoagulation in the setting of hematoma at the request of Dr. Jacki Cones.  This is a 78yo male with a hx of ASCAD s/p remote MI 57 and subsequent CABG and most recent cath  05/2017 showing occluded SVGs and patent LIMA to LAD s/p PCI of native OM and LCx.  He has a known ischemic DCM with EF 35-40% on echo 08/2017.  He also has chronic atrial fibrillation on chronic anticoagulation with Eliquis.  He has chronic bradycardia with HRs in the mid 40's that he has tolerated well.  Other med hx includes HTN, hyperlipidemia, carotid stenosis, chronic systolic CHF and DM.    He had a right inguinal hernia repair 07/25/2018 and presented to [facility] with complaints of right groin pain and was found to have cellulitis of the right groin wound with a 10cm hematoma in the area.  Started on broad spectrum antibiotics and transferred to [facility] for surgical evaluation.  INR notd to be 2.4-2.8 and creatinine ater fluids at 1.43.    Cardiology is asked to give guidance on medical management of heart meds, mainly his plavix and anticoagulation in the setting of hematoma.   He denies any CP, SOB, DOE, LE edema, palpitations or dizziness.     PMH:   Past Medical History:  Diagnosis Date  . Arthritis    "neck, lower back" (05/16/2017)  . Atrial fibrillation ([place])   . Atrial flutter ([place])   . Congestive heart failure, unspecified    45 - 50%  . COPD (chronic obstructive pulmonary disease) ([place])   . Coronary atherosclerosis of native coronary artery   . Gout    "on daily RX:" (05/16/2017)  . History of ETOH abuse    Quit 20 years ago.   Marland Kitchen Hyperlipidemia   . Mitral valve insufficiency    Mild to mod  on echo 2015  . Myocardial infarction ([place]) 1987  . Obesity   . Stroke [facility]) 01/2000   right posterior middle cerebral artery cerebrovascular accident/notes 51/11/2010; denies residual on 05/16/2017  . Type II diabetes mellitus ([place])   . Unspecified essential hypertension      PSH:   Past Surgical History:  Procedure Laterality Date  . CARDIAC CATHETERIZATION  1987   "couldn't get thru"  . CAROTID ENDARTERECTOMY Right 01/2000   WITH DACRON PATCH  . CATARACT EXTRACTION W/ INTRAOCULAR LENS  IMPLANT, BILATERAL    . CORONARY ANGIOPLASTY WITH STENT PLACEMENT  05/16/2017  . CORONARY ARTERY BYPASS GRAFT  1999   CABG X 4  . CORONARY STENT INTERVENTION N/A 05/16/2017   Procedure: CORONARY STENT INTERVENTION;  Surgeon: Leonie Man, MD;  Location: [facility];  Service: Cardiovascular;  Laterality: N/A;  . INCISION AND DRAINAGE ABSCESS ANAL  1987  . INGUINAL HERNIA REPAIR Left   . INGUINAL HERNIA REPAIR Right 07/25/2018   Procedure: RIGHT HERNIA REPAIR INGUINAL ADULT WITH MESH;  Surgeon: [name], MD;  Location: [facility];  Service: General;  Laterality: Right;  . INSERTION OF MESH Right 07/25/2018   Procedure: INSERTION OF MESH;  Surgeon: [name], MD;  Location: [facility];  Service: General;  Laterality: Right;  . RIGHT/LEFT HEART CATH AND CORONARY/GRAFT ANGIOGRAPHY N/A 05/16/2017   Procedure: RIGHT/LEFT  HEART CATH AND CORONARY/GRAFT ANGIOGRAPHY;  Surgeon: Leonie Man, MD;  Location: [facility];  Service: Cardiovascular;  Laterality: N/A;    Allergies:  Augmentin [amoxicillin-pot clavulanate] Prior to Admit Meds:   Medications Prior to Admission  Medication Sig Dispense Refill Last Dose  . albuterol (PROVENTIL HFA;VENTOLIN HFA) 108 (90 Base) MCG/ACT inhaler Inhale 2 puffs into the lungs every 6 (six) hours as needed for wheezing or shortness of breath.   > month  . allopurinol (ZYLOPRIM) 300 MG tablet Take 300 mg by mouth daily.     09/08/2018 at Unknown time  .  atorvastatin (LIPITOR) 40 MG tablet TAKE 1 TABLET BY MOUTH ONCE DAILY (Patient taking differently: Take 40 mg by mouth daily. ) 90 tablet 1 09/08/2018 at Unknown time  . clopidogrel (PLAVIX) 75 MG tablet Take 1 tablet (75 mg total) by mouth daily with breakfast. 90 tablet 3 09/08/2018 at 8:30am  . furosemide (LASIX) 20 MG tablet Take 3 tablets (60 mg total) by mouth daily. 90 tablet 11 09/08/2018 at Unknown time  . glyBURIDE (DIABETA) 5 MG tablet Take 5 mg by mouth at bedtime.   Past Week at Unknown time  . HYDROcodone-acetaminophen (NORCO/VICODIN) 5-325 MG tablet One tablet every four hours as needed for acute pain.  Limit of five days per  statue. (Patient taking differently: Take 1 tablet by mouth daily as needed for moderate pain. ) 30 tablet 0 Past Month at Unknown time  . Ibuprofen-diphenhydrAMINE HCl (IBUPROFEN PM) 200-25 MG CAPS Take 2 tablets by mouth at bedtime as needed (sleep).   Past Week at Unknown time  . JARDIANCE 25 MG TABS tablet Take 25 mg by mouth at bedtime.   2 09/08/2018 at Unknown time  . metFORMIN (GLUCOPHAGE) 500 MG tablet Take 500-1,000 mg by mouth See admin instructions. Pt takes 1000mg  AM and 500mg  PM   09/08/2018 at Unknown time  . nitroGLYCERIN (NITROSTAT) 0.4 MG SL tablet Place 1 tablet (0.4 mg total) under the tongue every 5 (five) minutes x 3 doses as needed for chest pain. If no relief after the 3rd dose, proceed to the ED for an evaluation 25 tablet 3 Taking  . Omega-3 Fatty Acids (FISH OIL) 1000 MG CAPS Take 1,000 mg by mouth daily.    09/08/2018 at Unknown time  . sacubitril-valsartan (ENTRESTO) 97-103 MG Take 1 tablet by mouth 2 (two) times daily. 180 tablet 2 09/08/2018 at Unknown time  . warfarin (COUMADIN) 5 MG tablet Take 5-7.5 mg by mouth See admin instructions. Takes 5 mg in the evening on Sun, take 7.5 mg in the evening Mon through Sat   Past Week at Unknown time   Fam HX:    Family History  Problem Relation Age of Onset  . Heart attack Father     Social HX:    Social History   Socioeconomic History  . Marital status: Married    Spouse name: Not on file  . Number of children: Not on file  . Years of education: Not on file  . Highest education level: Not on file  Occupational History  . Occupation: DISABLED  Social Needs  . Financial resource strain: Not on file  . Food insecurity:    Worry: Not on file    Inability: Not on file  . Transportation needs:    Medical: Not on file    Non-medical: Not on file  Tobacco Use  . Smoking status: Former Smoker    Packs/day: 2.00  Years: 32.00    Pack years: 64.00    Types: Cigarettes    Last attempt to quit: 07/12/1985    Years since quitting: 33.1  . Smokeless tobacco: Former Systems developer    Types: [place] date: 07/12/1986  Substance and Sexual Activity  . Alcohol use: No    Frequency: Never    Comment: [employer] EtOH; "stopped in the 1990s"  . Drug use: No  . Sexual activity: Never  Lifestyle  . Physical activity:    Days per week: Not on file    Minutes per session: Not on file  . Stress: Not on file  Relationships  . Social connections:    Talks on phone: Not on file    Gets together: Not on file    Attends religious service: Not on file    Active member of club or organization: Not on file    Attends meetings of clubs or organizations: Not on file    Relationship status: Not on file  . Intimate partner violence:    Fear of current or ex partner: Not on file    Emotionally abused: Not on file    Physically abused: Not on file    Forced sexual activity: Not on file  Other Topics Concern  . Not on file  Social History Narrative   DISABLED FROM [employer] AFTER A STROKE IN 2001           ROS:  All  ROS were addressed and are negative except what is stated in the HPI  Physical Exam: Blood pressure (!) 137/59, pulse 69, temperature 98.2 F (36.8 C), temperature source Oral, resp. rate 16, weight 82 kg, SpO2 98 %.    General: Well developed, well  nourished, in no acute distress Head: Eyes PERRLA, No xanthomas.   Normal cephalic and atramatic  Lungs:   Clear bilaterally to auscultation and percussion. Heart:   Irregularly irregular S1 S2 Pulses are 2+ & equal.            No carotid bruit. No JVD.  No abdominal bruits. No femoral bruits. Abdomen: Bowel sounds are positive, abdomen soft and non-tender without masses or  Hernia's noted.  Right groin swollen and erythematous Msk:  Back normal, normal gait. Normal strength and tone for age. Extremities:   No clubbing, cyanosis or edema.  DP +1 Neuro: Alert and oriented X 3. Psych:  Good affect, responds appropriately    Labs:   Lab Results  Component Value Date   WBC 18.2 (H) 09/10/2018   HGB 10.2 (L) 09/10/2018   HCT 31.3 (L) 09/10/2018   MCV 97.5 09/10/2018   PLT 225 09/10/2018    Recent Labs  Lab 09/10/18 0407  NA 133*  K 3.7  CL 105  CO2 19*  BUN 39*  CREATININE 1.38*  CALCIUM 8.1*  PROT 6.1*  BILITOT 1.0  ALKPHOS 100  ALT 26  AST 31  GLUCOSE 160*   No results found for: PTT Lab Results  Component Value Date   INR 2.1 (H) 09/09/2018   INR 1.32 05/17/2017   INR 1.33 05/16/2017   No results found for: CKTOTAL, CKMB, CKMBINDEX, TROPONINI   Lab Results  Component Value Date   CHOL 124 02/03/2018   Lab Results  Component Value Date   HDL 48 02/03/2018   Lab Results  Component Value Date   LDLCALC 52 02/03/2018   Lab Results  Component Value Date   TRIG 121 02/03/2018  No results found for: CHOLHDL No results found for: LDLDIRECT    Radiology:  Dg Chest Port 1 View  Result Date: 09/09/2018 CLINICAL DATA:  78 year old male with sepsis. EXAM: PORTABLE CHEST 1 VIEW COMPARISON:  Chest radiograph dated 12/18 7 FINDINGS: There is cardiomegaly with mild vascular congestion. No edema. No focal consolidation, pleural effusion, or pneumothorax. Median sternotomy wires and CABG vascular clips. No acute osseous pathology. IMPRESSION: 1. No acute  cardiopulmonary process. 2. Cardiomegaly. Electronically Signed   By: Anner Crete M.D.   On: 09/09/2018 23:28     Telemetry    Not on tele - Personally Reviewed  ECG    No EKG to review - Personally Reviewed   ASSESSMENT/PLAN:   1.  Sepsis -increased WBC, fever, elevated lactic acid and AKI c/w SIRS -secondary to cellulitis of right groin post inguinal hernia repair  -on antibx -surgery following -per TRH and surgery  2.  Right groin hematoma -related to recent inguinal hernia repair -CT showed 10cm hematoma in right groin -Plavix and warfarin on hold -per surgery  3.  ASCAD -s/p remote MI and CABG -cath 05/2017 showed occluded SVGs and patent LIMA to LAD and underwent PCI of native OM and LCx. -He was on Plavix 75mg  daily and no ASA due to coumadin at home -ok to hold plavix in the setting of acute groin bleed with large hematoma especially since he is more than a year out from PCI -continue statin -not on BB due to chronic bradycardia  4. Ischemic DM -last EF assessment 08/2017 showed E 35-40% -Entresto on hold due to AKI -no BB due to h/o bradycardia  5.  Chronic systolic CHF -does not appear volume overloaded on exam -Lasix and Entresto on hold due to sepsis and AKI  6.  Chronic atrial fibrillation -rate controlled on exam -not on BB due to bradycardia at baseline in the past -on warfarin at home - ok to hold in setting of acute groin hematoma  Fransico Him, MD  09/10/2018  2:12 PM

## 2018-09-11 DIAGNOSIS — Z9861 Coronary angioplasty status: Secondary | ICD-10-CM

## 2018-09-11 DIAGNOSIS — T888XXD Other specified complications of surgical and medical care, not elsewhere classified, subsequent encounter: Secondary | ICD-10-CM

## 2018-09-11 DIAGNOSIS — I255 Ischemic cardiomyopathy: Secondary | ICD-10-CM

## 2018-09-11 DIAGNOSIS — I2510 Atherosclerotic heart disease of native coronary artery without angina pectoris: Secondary | ICD-10-CM

## 2018-09-11 LAB — BASIC METABOLIC PANEL
Anion gap: 6 (ref 5–15)
BUN: 47 mg/dL — AB (ref 8–23)
CO2: 20 mmol/L — AB (ref 22–32)
Calcium: 8 mg/dL — ABNORMAL LOW (ref 8.9–10.3)
Chloride: 105 mmol/L (ref 98–111)
Creatinine, Ser: 1.49 mg/dL — ABNORMAL HIGH (ref 0.61–1.24)
GFR calc Af Amer: 51 mL/min — ABNORMAL LOW (ref >60)
GFR calc non Af Amer: 44 mL/min — ABNORMAL LOW (ref >60)
Glucose, Bld: 219 mg/dL — ABNORMAL HIGH (ref 70–99)
Potassium: 3.6 mmol/L (ref 3.5–5.1)
Sodium: 131 mmol/L — ABNORMAL LOW (ref 135–145)

## 2018-09-11 LAB — PROTIME-INR
INR: 1.6 — ABNORMAL HIGH (ref 0.8–1.2)
Prothrombin Time: 18.4 seconds — ABNORMAL HIGH (ref 11.4–15.2)

## 2018-09-11 LAB — GLUCOSE, CAPILLARY
Glucose-Capillary: 139 mg/dL — ABNORMAL HIGH (ref 70–99)
Glucose-Capillary: 161 mg/dL — ABNORMAL HIGH (ref 70–99)
Glucose-Capillary: 174 mg/dL — ABNORMAL HIGH (ref 70–99)
Glucose-Capillary: 203 mg/dL — ABNORMAL HIGH (ref 70–99)
Glucose-Capillary: 222 mg/dL — ABNORMAL HIGH (ref 70–99)
Glucose-Capillary: 242 mg/dL — ABNORMAL HIGH (ref 70–99)

## 2018-09-11 LAB — CBC
HCT: 31.5 % — ABNORMAL LOW (ref 39.0–52.0)
Hemoglobin: 9.9 g/dL — ABNORMAL LOW (ref 13.0–17.0)
MCH: 31.2 pg (ref 26.0–34.0)
MCHC: 31.4 g/dL (ref 30.0–36.0)
MCV: 99.4 fL (ref 80.0–100.0)
Platelets: 257 10*3/uL (ref 150–400)
RBC: 3.17 MIL/uL — ABNORMAL LOW (ref 4.22–5.81)
RDW: 13.8 % (ref 11.5–15.5)
WBC: 15.5 10*3/uL — ABNORMAL HIGH (ref 4.0–10.5)
nRBC: 0 % (ref 0.0–0.2)

## 2018-09-11 MED ORDER — SODIUM CHLORIDE 0.9 % IV SOLN
1.0000 g | Freq: Two times a day (BID) | INTRAVENOUS | Status: DC
  Administered 2018-09-11 – 2018-09-13 (×4): 1 g via INTRAVENOUS
  Filled 2018-09-11 (×4): qty 1

## 2018-09-11 MED ORDER — LIDOCAINE HCL 2 % IJ SOLN
INTRAMUSCULAR | Status: AC
  Filled 2018-09-11: qty 20

## 2018-09-11 MED ORDER — TRAMADOL HCL 50 MG PO TABS
50.0000 mg | ORAL_TABLET | Freq: Four times a day (QID) | ORAL | Status: DC | PRN
  Administered 2018-09-11: 100 mg via ORAL
  Filled 2018-09-11: qty 2

## 2018-09-11 MED ORDER — HYDROCODONE-ACETAMINOPHEN 5-325 MG PO TABS
1.0000 | ORAL_TABLET | ORAL | Status: DC | PRN
  Administered 2018-09-12: 2 via ORAL
  Administered 2018-09-12: 1 via ORAL
  Administered 2018-09-13: 2 via ORAL
  Filled 2018-09-11: qty 2
  Filled 2018-09-11: qty 1
  Filled 2018-09-11: qty 2

## 2018-09-11 NOTE — Progress Notes (Signed)
Called to room by patient who states sheets are "wet." Once again another 30cc of brownish-pink drainage measured and a lot more unmeasured drainage in bed. Removed soiled gauze/abd dressing and applied ostomy bag over pinhole in attempt to catch drainage.

## 2018-09-11 NOTE — Progress Notes (Signed)
[facility] Progress Note     Subjective: CC-  Patient states that his right groin started draining over night. INR 1.6 WBC trending down 15.5, TMAX 99.1  Objective: Vital signs in last 24 hours: Temp:  [98.2 F (36.8 C)-99.1 F (37.3 C)] 98.9 F (37.2 C) (03/02 0437) Pulse Rate:  [64-78] 64 (03/02 0437) Resp:  [16-18] 18 (03/02 0437) BP: (101-137)/(55-59) 101/55 (03/02 0437) SpO2:  [95 %-99 %] 99 % (03/02 0437)    Intake/Output from previous day: 03/01 0701 - 03/02 0700 In: 1355.1 [P.O.:480; I.V.:110; IV Piggyback:765.1] Out: 1050 [Urine:1050] Intake/Output this shift: Total I/O In: -  Out: 300 [Urine:300]  PE: Gen:  Alert, NAD, pleasant HEENT: EOM's intact, pupils equal and round Pulm:  effort normal Abd: Soft, NT/ND, +BS, no HSM Skin: right groin with multiple openings along incision with old bloody/purulent drainage and surrounding erythema    Incision opened the rest of the way, wound washed out and packed      Lab Results:  Recent Labs    09/10/18 0407 09/11/18 0348  WBC 18.2* 15.5*  HGB 10.2* 9.9*  HCT 31.3* 31.5*  PLT 225 257   BMET Recent Labs    09/10/18 0407 09/11/18 0348  NA 133* 131*  K 3.7 3.6  CL 105 105  CO2 19* 20*  GLUCOSE 160* 219*  BUN 39* 47*  CREATININE 1.38* 1.49*  CALCIUM 8.1* 8.0*   PT/INR Recent Labs    09/09/18 2059 09/11/18 0348  LABPROT 23.4* 18.4*  INR 2.1* 1.6*   CMP     Component Value Date/Time   NA 131 (L) 09/11/2018 0348   NA 141 02/23/2018 0841   K 3.6 09/11/2018 0348   CL 105 09/11/2018 0348   CO2 20 (L) 09/11/2018 0348   GLUCOSE 219 (H) 09/11/2018 0348   BUN 47 (H) 09/11/2018 0348   BUN 40 (H) 02/23/2018 0841   CREATININE 1.49 (H) 09/11/2018 0348   CALCIUM 8.0 (L) 09/11/2018 0348   PROT 6.1 (L) 09/10/2018 0407   PROT 7.4 02/03/2018 0802   ALBUMIN 2.5 (L) 09/10/2018 0407   ALBUMIN 4.6 02/03/2018 0802   AST 31 09/10/2018 0407   ALT 26 09/10/2018 0407   ALKPHOS 100  09/10/2018 0407   BILITOT 1.0 09/10/2018 0407   BILITOT 0.5 02/03/2018 0802   GFRNONAA 44 (L) 09/11/2018 0348   GFRAA 51 (L) 09/11/2018 0348   Lipase  No results found for: LIPASE     Studies/Results: Dg Chest Port 1 View  Result Date: 09/09/2018 CLINICAL DATA:  78 year old male with sepsis. EXAM: PORTABLE CHEST 1 VIEW COMPARISON:  Chest radiograph dated 12/18 7 FINDINGS: There is cardiomegaly with mild vascular congestion. No edema. No focal consolidation, pleural effusion, or pneumothorax. Median sternotomy wires and CABG vascular clips. No acute osseous pathology. IMPRESSION: 1. No acute cardiopulmonary process. 2. Cardiomegaly. Electronically Signed   By: Anner Crete M.D.   On: 09/09/2018 23:28    Anti-infectives: Anti-infectives (From admission, onward)   Start     Dose/Rate Route Frequency Ordered Stop   09/10/18 1600  vancomycin (VANCOCIN) IVPB 1000 mg/200 mL premix     1,000 mg 200 mL/hr over 60 Minutes Intravenous Every 24 hours 09/09/18 2321     09/10/18 1000  aztreonam (AZACTAM) 2 g in sodium chloride 0.9 % 100 mL IVPB  Status:  Discontinued     2 g 200 mL/hr over 30 Minutes Intravenous Every 12 hours 09/09/18 2322 09/09/18 2323   09/10/18 0600  aztreonam (  AZACTAM) 2 g in sodium chloride 0.9 % 100 mL IVPB     2 g 200 mL/hr over 30 Minutes Intravenous Every 8 hours 09/09/18 2323     09/09/18 2300  metroNIDAZOLE (FLAGYL) IVPB 500 mg     500 mg 100 mL/hr over 60 Minutes Intravenous Every 8 hours 09/09/18 2233     09/09/18 2300  aztreonam (AZACTAM) 2 GM IVPB  Status:  Discontinued     2 g 100 mL/hr over 30 Minutes Intravenous  Once 09/09/18 2246 09/09/18 2249   09/09/18 2300  aztreonam (AZACTAM) 2 g in sodium chloride 0.9 % 100 mL IVPB     2 g 200 mL/hr over 30 Minutes Intravenous  Once 09/09/18 2249 09/09/18 2354   09/09/18 2215  vancomycin (VANCOCIN) 1,750 mg in sodium chloride 0.9 % 500 mL IVPB  Status:  Discontinued     1,750 mg 250 mL/hr over 120 Minutes  Intravenous  Once 09/09/18 2209 09/09/18 2231       Assessment/Plan HTN DM-II H/o CVA - hold plavix Chronic Afib - hold coumadin Systolic and diastolic heart failure Gout HLD  S/p right inguinal hernia repair 07/25/18 Dr. Harlow Asa Postoperative hematoma - Wound spontaneously draining. I opened up the incision more and packed with saline dampened kerlex. Cultures sent. Continue BID dressing changes. Ok to shower with wound open. Continue abx.  ID - azactam/vancomycin/flagyl 2/29>> FEN - CM diet VTE - SCDs, ok for lovenox or heparin from surgical standpoint Foley - none Follow up - Dr. Harlow Asa   LOS: 2 days    Wellington [place] , [facility] 09/11/2018, 10:39 AM Pager: [phone] Mon-Thurs 7:00 am-4:30 pm Fri 7:00 am -11:30 AM Sat-Sun 7:00 am-11:30 am

## 2018-09-11 NOTE — Progress Notes (Signed)
Patient had a large amount of brownish-pink drainage in bed. Nurse found that the corner of the closed incision (the size of pin-hole) had been leaking. When cleaning the area a large amount of drainage came out of the incision.   30cc of drainage was measured with a lot unmeasured in the bed. The area was much less edematous than prior assessment earlier in the night. The area was covered with gauze and an ABD pad. Will continue to assess area for changes or more drainage.   Dr Silas [place] made aware.

## 2018-09-11 NOTE — Progress Notes (Signed)
PROGRESS NOTE    Adrian Barrett  HER:[id number] DOB: Apr 06, 1941 DOA: 09/09/2018 PCP: Monico Blitz, MD   Brief Narrative:77 y.o.malewith medical history significant of diabetes, atrial fibrillation/flutter, coronary artery disease status post stent on chronic anticoagulation, Chronic systolic CHF, COPD, HLD  [place] and swelling on the right groin. had right inguinal hernia repair on January 14. On presentation, right groin was found to be erythematous, swollen and consistent with findings of cellulitis. Imagings confirmed he has a 10 cm hematoma on that area. Patient was started on empiric broad-spectrum antibiotics.[facility] saw him but they were reluctant to do the surgery and asked the hospitalist team over there to transfer the patient here. Hospitalist from [place] have discussed with general surgery, Dr. Leighton Ruff, who agrees with the transfer and general surgery will start following him here when he comes.  Need to cardiology new patient was breech for his hernia repair with Lovenox his Plavix washeld while only onaspirin Regarding pertinent Chronic problems:  Last echo feb 2019 Grade 2 diastolic CHF andSystolic CHF EF 35 - 85% on 60 mg of Lasix Etresto CADHe had a cardiac cath 05/16/17. Cath revealed occluded SVGs and a patent LIMA-LAD. He underwent PCI with DES to his native OM and CFXon plavix, lipitor   DM2 - on Metformin  Hx of A. Fib on coumadinCHA2DS2 - VASc score of 4  Assessment & Plan:   Principal Problem:   Fluid collection at surgical site Active Problems:   Non-insulin dependent type 2 diabetes mellitus (HCC)   Type 2 diabetes mellitus with hyperlipidemia (HCC)   Chronic atrial fibrillation   COPD (chronic obstructive pulmonary disease) (HCC)   Essential hypertension   Acute combined systolic and diastolic heart failure (HCC)   Ischemic cardiomyopathy   Coronary artery  disease involving native coronary artery of native heart with unstable angina pectoris (HCC)   CAD S/P percutaneous coronary angioplasty   Chronic anticoagulation   Right inguinal hernia status post open repair with mesh 07/24/2018   Cellulitis   Hematoma   Sepsis ([place])   Right inguinal hematoma status post recent inguinal hernia repair patient admitted with fever leukocytosis and swelling erythema to the site of incision.  Incision opened up today.  Surgery has packed the wound with Kerlix and cultures are sent.  Continue IV antibiotics follow-up cultures.  Leukocytosis improving.  HTN STILL SOFT continue to monitor off antihypertensives  TYPE 2 DM takes jardiance glyburide  and metformin at home continue SSI.  CHRONIC AFIB rate controlled   COMBINED SYSTOLIC AND DIASTOLIC HEART FAILURE-on lasix 60 mg daily.  Stable currently Lasix on hold due to soft blood pressure monitor closely for fluid overload.  Gout on allopurinol  hyperlipedemia on lipitor  Estimated body mass index is 25.94 kg/m as calculated from the following:   Height as of 07/25/18: 5\' 10"  (1.778 m).   Weight as of this encounter: 82 kg.    Subjective:  Patient resting in bed wife by the bedside complains of drainage overnight surgical wound. Objective: Vitals:   09/10/18 0537 09/10/18 1359 09/10/18 2012 09/11/18 0437  BP: (!) 121/54 (!) 137/59 (!) 117/55 (!) 101/55  Pulse: 71 69 78 64  Resp: 20 16 18 18   Temp: 98.9 F (37.2 C) 98.2 F (36.8 C) 99.1 F (37.3 C) 98.9 F (37.2 C)  TempSrc: Oral Oral Oral Oral  SpO2: 97% 98% 95% 99%  Weight:        Intake/Output Summary (Last 24 hours)  at 09/11/2018 1145 Last data filed at 09/11/2018 1000 Gross per 24 hour  Intake 1015.07 ml  Output 1100 ml  Net -84.93 ml   Filed Weights   09/09/18 2300  Weight: 82 kg    Examination:  General exam: Appears calm and comfortable  Respiratory system: Clear to auscultation. Respiratory effort  normal. Cardiovascular system: S1 & S2 heard, RRR. No JVD, murmurs, rubs, gallops or clicks. No pedal edema. Gastrointestinal system: Abdomen is nondistended, soft and nontender. No organomegaly or masses felt. Normal bowel sounds heard.  Right lower quadrant incision erythema swollen tender to touch Central nervous system: Alert and oriented. No focal neurological deficits. Extremities: Symmetric 5 x 5 power. Skin: No rashes, lesions or ulcers Psychiatry: Judgement and insight appear normal. Mood & affect appropriate.     Data Reviewed: I have personally reviewed following labs and imaging studies  CBC: Recent Labs  Lab 09/09/18 2059 09/10/18 0407 09/11/18 0348  WBC 17.8* 18.2* 15.5*  NEUTROABS 14.6*  --   --   HGB 11.5* 10.2* 9.9*  HCT 35.7* 31.3* 31.5*  MCV 99.7 97.5 99.4  PLT 261 225 440   Basic Metabolic Panel: Recent Labs  Lab 09/09/18 2059 09/10/18 0407 09/11/18 0348  NA 135 133* 131*  K 4.1 3.7 3.6  CL 104 105 105  CO2 23 19* 20*  GLUCOSE 222* 160* 219*  BUN 39* 39* 47*  CREATININE 1.61* 1.38* 1.49*  CALCIUM 8.5* 8.1* 8.0*  MG  --  1.8  --   PHOS  --  2.3*  --    GFR: Estimated Creatinine Clearance: 41.5 mL/min (A) (by C-G formula based on SCr of 1.49 mg/dL (H)). Liver Function Tests: Recent Labs  Lab 09/09/18 2059 09/10/18 0407  AST 38 31  ALT 29 26  ALKPHOS 128* 100  BILITOT 1.0 1.0  PROT 7.0 6.1*  ALBUMIN 3.0* 2.5*   No results for input(s): LIPASE, AMYLASE in the last 168 hours. No results for input(s): AMMONIA in the last 168 hours. Coagulation Profile: Recent Labs  Lab 09/09/18 2059 09/11/18 0348  INR 2.1* 1.6*   Cardiac Enzymes: No results for input(s): CKTOTAL, CKMB, CKMBINDEX, TROPONINI in the last 168 hours. BNP (last 3 results) No results for input(s): PROBNP in the last 8760 hours. HbA1C: Recent Labs    09/09/18 2059  HGBA1C 9.1*   CBG: Recent Labs  Lab 09/10/18 1610 09/10/18 1946 09/11/18 0009 09/11/18 0439  09/11/18 0818  GLUCAP 213* 233* 242* 203* 161*   Lipid Profile: No results for input(s): CHOL, HDL, LDLCALC, TRIG, CHOLHDL, LDLDIRECT in the last 72 hours. Thyroid Function Tests: Recent Labs    09/10/18 0407  TSH 1.609   Anemia Panel: No results for input(s): VITAMINB12, FOLATE, FERRITIN, TIBC, IRON, RETICCTPCT in the last 72 hours. Sepsis Labs: Recent Labs  Lab 09/09/18 2237 09/10/18 0407  PROCALCITON 0.38  --   LATICACIDVEN 1.2 0.8    Recent Results (from the past 240 hour(s))  Culture, blood (Routine X 2) w Reflex to ID Panel     Status: None (Preliminary result)   Collection Time: 09/09/18 10:22 PM  Result Value Ref Range Status   Specimen Description BLOOD RIGHT ANTECUBITAL  Final   Special Requests   Final    BOTTLES DRAWN AEROBIC AND ANAEROBIC Blood Culture adequate volume Performed at [facility] [address], [place], [place] [address]    Culture NO GROWTH 1 DAY  Final   Report Status PENDING  Incomplete  Culture, blood (  Routine X 2) w Reflex to ID Panel     Status: None (Preliminary result)   Collection Time: 09/09/18 10:37 PM  Result Value Ref Range Status   Specimen Description BLOOD RIGHT ANTECUBITAL  Final   Special Requests   Final    BOTTLES DRAWN AEROBIC AND ANAEROBIC Blood Culture adequate volume Performed at [facility] [address], [place], [place] [address]    Culture NO GROWTH 1 DAY  Final   Report Status PENDING  Incomplete  MRSA PCR Screening     Status: None   Collection Time: 09/10/18 12:01 AM  Result Value Ref Range Status   MRSA by PCR NEGATIVE NEGATIVE Final    Comment:        The GeneXpert MRSA Assay (FDA approved for NASAL specimens only), is one component of a comprehensive MRSA colonization surveillance program. It is not intended to diagnose MRSA infection nor to guide or monitor treatment for MRSA infections. Performed at [facility], [place] [address]  [place], [place] [address]          Radiology Studies: Dg Chest Port 1 View  Result Date: 09/09/2018 CLINICAL DATA:  78 year old male with sepsis. EXAM: PORTABLE CHEST 1 VIEW COMPARISON:  Chest radiograph dated 12/18 7 FINDINGS: There is cardiomegaly with mild vascular congestion. No edema. No focal consolidation, pleural effusion, or pneumothorax. Median sternotomy wires and CABG vascular clips. No acute osseous pathology. IMPRESSION: 1. No acute cardiopulmonary process. 2. Cardiomegaly. Electronically Signed   By: Anner Crete M.D.   On: 09/09/2018 23:28        Scheduled Meds: . allopurinol  300 mg Oral Daily  . atorvastatin  40 mg Oral Daily  . insulin aspart  0-9 Units Subcutaneous Q4H  . lidocaine      . sodium chloride flush  3 mL Intravenous Q12H   Continuous Infusions: . sodium chloride    . aztreonam 2 g (09/11/18 0502)  . metronidazole 500 mg (09/11/18 2694)  . vancomycin 200 mL/hr at 09/10/18 1800     LOS: 2 days      Georgette Shell, MD [facility]  If 7PM-7AM, please contact night-coverage [email or web address] Password [email or web address] 09/11/2018, 11:45 AM

## 2018-09-11 NOTE — Progress Notes (Signed)
Progress Note  Patient Name: Adrian Barrett Date of Encounter: 09/11/2018  Primary Cardiologist: Minus Breeding, MD   Subjective   No significant overnight events. Right groin started to spontaneously drain overnight. No chest pain, shortness of breath, or palpitations.   Inpatient Medications    Scheduled Meds: . allopurinol  300 mg Oral Daily  . atorvastatin  40 mg Oral Daily  . insulin aspart  0-9 Units Subcutaneous Q4H  . lidocaine      . sodium chloride flush  3 mL Intravenous Q12H   Continuous Infusions: . sodium chloride    . ceFEPime (MAXIPIME) IV    . metronidazole 500 mg (09/11/18 0488)  . vancomycin 200 mL/hr at 09/10/18 1800   PRN Meds: sodium chloride, acetaminophen **OR** acetaminophen, albuterol, HYDROcodone-acetaminophen, ondansetron **OR** ondansetron (ZOFRAN) IV, sodium chloride flush, traMADol   Vital Signs    Vitals:   09/10/18 0537 09/10/18 1359 09/10/18 2012 09/11/18 0437  BP: (!) 121/54 (!) 137/59 (!) 117/55 (!) 101/55  Pulse: 71 69 78 64  Resp: 20 16 18 18   Temp: 98.9 F (37.2 C) 98.2 F (36.8 C) 99.1 F (37.3 C) 98.9 F (37.2 C)  TempSrc: Oral Oral Oral Oral  SpO2: 97% 98% 95% 99%  Weight:        Intake/Output Summary (Last 24 hours) at 09/11/2018 1236 Last data filed at 09/11/2018 1000 Gross per 24 hour  Intake 1015.07 ml  Output 1100 ml  Net -84.93 ml   Filed Weights   09/09/18 2300  Weight: 82 kg    Telemetry    Patient not on telemetry. - Personally Reviewed  ECG    No new ECG tracing today. - Personally Reviewed  Physical Exam   GEN: Caucasian male resting comfortably. Alert and in no acute distress.   Neck: Supple. No JVD. Cardiac: Irregularly irregular rhythm with controlled rate. No murmurs, gallops, or rubs.  Respiratory: Clear to auscultation bilaterally. No significant wheezes, rhonchi, or rales. GI: Abdomen soft, non-distended, and non-tender. Bowel sounds present. MS: Open and packed right groin wound. No  lower extremity edema. No deformity. Skin: Warm and dry. Neuro:  No focal deficits. Psych: Normal affect. Responds appropriately.  Labs    Chemistry Recent Labs  Lab 09/09/18 2059 09/10/18 0407 09/11/18 0348  NA 135 133* 131*  K 4.1 3.7 3.6  CL 104 105 105  CO2 23 19* 20*  GLUCOSE 222* 160* 219*  BUN 39* 39* 47*  CREATININE 1.61* 1.38* 1.49*  CALCIUM 8.5* 8.1* 8.0*  PROT 7.0 6.1*  --   ALBUMIN 3.0* 2.5*  --   AST 38 31  --   ALT 29 26  --   ALKPHOS 128* 100  --   BILITOT 1.0 1.0  --   GFRNONAA 40* 48* 44*  GFRAA 46* 56* 51*  ANIONGAP 8 9 6      Hematology Recent Labs  Lab 09/09/18 2059 09/10/18 0407 09/11/18 0348  WBC 17.8* 18.2* 15.5*  RBC 3.58* 3.21* 3.17*  HGB 11.5* 10.2* 9.9*  HCT 35.7* 31.3* 31.5*  MCV 99.7 97.5 99.4  MCH 32.1 31.8 31.2  MCHC 32.2 32.6 31.4  RDW 13.6 13.6 13.8  PLT 261 225 257    Cardiac EnzymesNo results for input(s): TROPONINI in the last 168 hours. No results for input(s): TROPIPOC in the last 168 hours.   BNPNo results for input(s): BNP, PROBNP in the last 168 hours.   DDimer No results for input(s): DDIMER in the last 168 hours.  Radiology    Dg Chest Port 1 View  Result Date: 09/09/2018 CLINICAL DATA:  78 year old male with sepsis. EXAM: PORTABLE CHEST 1 VIEW COMPARISON:  Chest radiograph dated 12/18 7 FINDINGS: There is cardiomegaly with mild vascular congestion. No edema. No focal consolidation, pleural effusion, or pneumothorax. Median sternotomy wires and CABG vascular clips. No acute osseous pathology. IMPRESSION: 1. No acute cardiopulmonary process. 2. Cardiomegaly. Electronically Signed   By: Anner Crete M.D.   On: 09/09/2018 23:28    Cardiac Studies   Right/Left Heart Catheterization 05/16/2017:  LESION #1: Prox Cx lesion is 75% stenosed.  A drug-eluting stent was successfully placed using a STENT SIERRA 3.00 X 12 MM.  Post intervention, there is a 0% residual stenosis.  Using a BALLOON EMERGE MR  2.5X8.  LESION #2: Mid Cx (prox OM) lesion is 85% (focal) after AV Groove branch  A drug-eluting stent was successfully placed using a STENT SIERRA 3.00 X 08 MM.  Post intervention, there is a 5% residual stenosis.  Prox RCA to Dist RCA lesion is 100% stenosed. SVG-RPDA origin to Prox Graft lesion is 100% stenosed.  Prox LAD to Mid LAD lesion is 100% stenosed.  LIMA-dLAD is widely patent with good runoff distally. -Very difficult to engage  SVG-OM & SVG-RI(Diag)Origin to Prox Graft lesion is 100% stenosed.  Hemodynamic findings consistent with severe pulmonary hypertension.  LV end diastolic pressure is moderately elevated.   Severe native vessel disease with occluded RCA and LAD.  SVG grafts to presumably the RPDA and OM as well as either ramus or diagonal branch are all occluded. LIMA-LAD is patent. Significant left to right collaterals via AV groove branch.   The circumflex has proximal and mid (lateral OM) of 75 and 85% respectively.  Both were treated with separate DES stents thereby avoiding potential jailing of the ostium of the AV groove circumflex -which provides collaterals to the RCA.  The patient has severe CHF and secondary pulmonary hypertension, and will likely require aggressive diuresis.  Plan:  Overnight observation post procedure, but low threshold for admitting for short-term diuresis. --I have written for Lasix 40 mill grams IV twice daily starting this afternoon  Restart warfarin tonight along with aspirin and Plavix.  Continue DAPT for 3 months and then stop aspirin -continue Plavix plus warfarin  Dr. Percival Spanish started low-dose carvedilol.  We will continue for now, but he was bradycardic. _______________  Echocardiogram 08/29/2017: Study Conclusions: - Left ventricle: The cavity size was normal. There was mild   concentric hypertrophy. Systolic function was moderately reduced.   The estimated ejection fraction was in the range of 35% to 40%.   Severe  hypokinesis of the inferolateral and inferior myocardium;   consistent with infarction in the distribution of the right   coronary and left circumflex coronary artery. Features are   consistent with a pseudonormal left ventricular filling pattern,   with concomitant abnormal relaxation and increased filling   pressure (grade 2 diastolic dysfunction). Acoustic contrast   opacification revealed no evidence ofthrombus. Acoustic contrast   opacification revealed no evidence ofthrombus. - Mitral valve: Calcified annulus. There was mild to moderate   regurgitation directed centrally. - Left atrium: The atrium was severely dilated. - Right ventricle: The cavity size was dilated. Systolic function   was moderately reduced. - Right atrium: The atrium was mildly dilated. - Pulmonary arteries: Systolic pressure was moderately increased.   PA peak pressure: 57 mm Hg (S). - Pericardium, extracardiac: A trivial pericardial effusion was   identified.  Patient Profile   Adrian Barrett is a 78 y.o. male with a history of CAD s/p CABG with most recent cardiac catheterization in 05/2017 showing occluded SVGs and patent LIMA to LAD s/p PCI of the native OM and left circumflex, ischemic cardiomyopathy with EF of 35-40% on Echo in 08/2017, chronic atrial fibrillation on Warfarin, chronic bradycardia with heart rates in the mid 40's, hypertension, hyperlipidemia, and carotid stenosis who is being seen today for the evaluation of guidance with anticoagulation in the setting of right groin hematoma following recent right inguinal hernia repair at the request of Dr.Matthews.  Assessment & Plan    Right Groin Hematoma  - Related to recent inguinal hernia repair.  - CT showed a 10 cm hematoma in the right groin. - Continue to hold Plavix and Warfarin. - Management per surgery.  Sepsis Secondary to Cellulitis of Right Groin - Management per primary team and surgery.  CAD  - Patient has a history of remote MI  and CABG. Most recent cardiac catheterization in 05/2017 showed occluded SVGs and patent LIMA to LAD. Patient underwent PCI of the native OM and left circumflex at that time.  - Patient on Plavix 75mg  daily at home. He is not on any Aspirin at one due to Coumadin. - OK to continue to hold Plavix in the setting of acute groin bleed with large hematoma, especially since he is more than a year out from PCI. Restart when OK from surgery standpoint.  - Continue home statin. - Patient is not on any beta-blocker at home due to chronic bradycardia.   Chronic Systolic CHF / Ischemic Cardiomyopathy - LVEF of 35-40% on Echo in 08/2017. - Patient does not appear to be volume overloaded on exam.  - Home Entresto and Lasix currently being held due to sepsis and renal function. Serum creatinine 1.49 today (up from 1.38 yesterday). Baseline appears to be 1.2 to 1.6.  - Patient not on any beta-blockers due to chronic bradycardia. - Will continue to monitor volume status.   Chronic Atrial Fibrillation - Heart rate controlled on exam. Patient is not on telemetry. - Not on any rate control medications due to history of chronic bradycardia. - OK to hold home Warfarin in the setting of acute groin hematoma. INR 1.6 today. Per surgery note, OK to use Lovenox or Heparin for VTE prophylaxis. Will defer timing of when to restart Warfarin to MD.  Otherwise, per primary team.   For questions or updates, please contact [place] Please consult [email or web address] for contact info under Cardiology/STEMI.      Signed, Darreld Mclean, PA-C  09/11/2018, 12:36 PM

## 2018-09-12 LAB — GLUCOSE, CAPILLARY
Glucose-Capillary: 154 mg/dL — ABNORMAL HIGH (ref 70–99)
Glucose-Capillary: 172 mg/dL — ABNORMAL HIGH (ref 70–99)
Glucose-Capillary: 179 mg/dL — ABNORMAL HIGH (ref 70–99)
Glucose-Capillary: 194 mg/dL — ABNORMAL HIGH (ref 70–99)
Glucose-Capillary: 194 mg/dL — ABNORMAL HIGH (ref 70–99)
Glucose-Capillary: 203 mg/dL — ABNORMAL HIGH (ref 70–99)

## 2018-09-12 LAB — PROTIME-INR
INR: 1.5 — ABNORMAL HIGH (ref 0.8–1.2)
Prothrombin Time: 17.8 seconds — ABNORMAL HIGH (ref 11.4–15.2)

## 2018-09-12 LAB — BASIC METABOLIC PANEL
Anion gap: 6 (ref 5–15)
BUN: 47 mg/dL — ABNORMAL HIGH (ref 8–23)
CO2: 19 mmol/L — ABNORMAL LOW (ref 22–32)
CREATININE: 1.24 mg/dL (ref 0.61–1.24)
Calcium: 8.5 mg/dL — ABNORMAL LOW (ref 8.9–10.3)
Chloride: 109 mmol/L (ref 98–111)
GFR calc Af Amer: >60 mL/min (ref >60)
GFR calc non Af Amer: 55 mL/min — ABNORMAL LOW (ref >60)
Glucose, Bld: 153 mg/dL — ABNORMAL HIGH (ref 70–99)
Potassium: 4.2 mmol/L (ref 3.5–5.1)
Sodium: 134 mmol/L — ABNORMAL LOW (ref 135–145)

## 2018-09-12 MED ORDER — WARFARIN SODIUM 2.5 MG PO TABS
2.5000 mg | ORAL_TABLET | Freq: Once | ORAL | Status: AC
  Administered 2018-09-12: 2.5 mg via ORAL
  Filled 2018-09-12: qty 1

## 2018-09-12 MED ORDER — WARFARIN - PHARMACIST DOSING INPATIENT: Freq: Every day | Status: DC

## 2018-09-12 NOTE — Progress Notes (Signed)
Assumed care of patient from previous nurse. Agree with previous assessment; will continue to monitor patient.

## 2018-09-12 NOTE — Progress Notes (Addendum)
Subjective: CC: Right groin pain Patient notes he had a good night and feels pain in right groin has improved since I&D yesterday. Tolerating dressing changes with oral pain medication. Wife is at bedside, who he lives at home with. She is going to try and learn how to pack the wound. Reports his daughter in law is also a retired PA and can help. No other complaints. Tolerating diet. No fever, chills, N/V.    Objective: Vital signs in last 24 hours: Temp:  [97.6 F (36.4 C)-98.4 F (36.9 C)] 97.6 F (36.4 C) (03/03 0620) Pulse Rate:  [50-56] 50 (03/03 0620) Resp:  [16-18] 16 (03/03 0620) BP: (115-120)/(54-60) 120/56 (03/03 0620) SpO2:  [97 %-98 %] 98 % (03/03 0620) Last BM Date: 09/11/18  Intake/Output from previous day: 03/02 0701 - 03/03 0700 In: 3240 [P.O.:2600; I.V.:40; IV Piggyback:600] Out: 1425 [Urine:1425] Intake/Output this shift: No intake/output data recorded.  PE: Gen: Awake and alert Heart: Irregular rhythm with regular rate Lungs: Normal effort Abd: Soft, ND, NT, +BS GU: Right groin incision packing removed. Small amount of purulence on Kerlex. No further purulent drainage. Wound measures 8cm long x 5cm deep. It appears clean and dry (see pictures below). There is a 2cm sinus that tract medially at the base. There is a 2cm sinus that tract laterally at the base. There is surrounding cellulitis that is erythematous, blanchable, warm to touch without induration or fluctuance. Patient feels cellulitis is improving. Wound was repacked with saline dampened kerlex and covered with dry abd pad.  Msk: No edema        Lab Results:  Recent Labs    09/10/18 0407 09/11/18 0348  WBC 18.2* 15.5*  HGB 10.2* 9.9*  HCT 31.3* 31.5*  PLT 225 257   BMET Recent Labs    09/11/18 0348 09/12/18 0748  NA 131* 134*  K 3.6 4.2  CL 105 109  CO2 20* 19*  GLUCOSE 219* 153*  BUN 47* 47*  CREATININE 1.49* 1.24  CALCIUM 8.0* 8.5*   PT/INR Recent Labs     09/09/18 2059 09/11/18 0348  LABPROT 23.4* 18.4*  INR 2.1* 1.6*   CMP     Component Value Date/Time   NA 134 (L) 09/12/2018 0748   NA 141 02/23/2018 0841   K 4.2 09/12/2018 0748   CL 109 09/12/2018 0748   CO2 19 (L) 09/12/2018 0748   GLUCOSE 153 (H) 09/12/2018 0748   BUN 47 (H) 09/12/2018 0748   BUN 40 (H) 02/23/2018 0841   CREATININE 1.24 09/12/2018 0748   CALCIUM 8.5 (L) 09/12/2018 0748   PROT 6.1 (L) 09/10/2018 0407   PROT 7.4 02/03/2018 0802   ALBUMIN 2.5 (L) 09/10/2018 0407   ALBUMIN 4.6 02/03/2018 0802   AST 31 09/10/2018 0407   ALT 26 09/10/2018 0407   ALKPHOS 100 09/10/2018 0407   BILITOT 1.0 09/10/2018 0407   BILITOT 0.5 02/03/2018 0802   GFRNONAA 55 (L) 09/12/2018 0748   GFRAA >60 09/12/2018 0748   Lipase  No results found for: LIPASE     Studies/Results: No results found.  Anti-infectives: Anti-infectives (From admission, onward)   Start     Dose/Rate Route Frequency Ordered Stop   09/11/18 1800  ceFEPIme (MAXIPIME) 1 g in sodium chloride 0.9 % 100 mL IVPB     1 g 200 mL/hr over 30 Minutes Intravenous Every 12 hours 09/11/18 1231     09/10/18 1600  vancomycin (VANCOCIN) IVPB 1000 mg/200 mL premix  1,000 mg 200 mL/hr over 60 Minutes Intravenous Every 24 hours 09/09/18 2321     09/10/18 1000  aztreonam (AZACTAM) 2 g in sodium chloride 0.9 % 100 mL IVPB  Status:  Discontinued     2 g 200 mL/hr over 30 Minutes Intravenous Every 12 hours 09/09/18 2322 09/09/18 2323   09/10/18 0600  aztreonam (AZACTAM) 2 g in sodium chloride 0.9 % 100 mL IVPB  Status:  Discontinued     2 g 200 mL/hr over 30 Minutes Intravenous Every 8 hours 09/09/18 2323 09/11/18 1228   09/09/18 2300  metroNIDAZOLE (FLAGYL) IVPB 500 mg     500 mg 100 mL/hr over 60 Minutes Intravenous Every 8 hours 09/09/18 2233     09/09/18 2300  aztreonam (AZACTAM) 2 GM IVPB  Status:  Discontinued     2 g 100 mL/hr over 30 Minutes Intravenous  Once 09/09/18 2246 09/09/18 2249   09/09/18 2300   aztreonam (AZACTAM) 2 g in sodium chloride 0.9 % 100 mL IVPB     2 g 200 mL/hr over 30 Minutes Intravenous  Once 09/09/18 2249 09/09/18 2354   09/09/18 2215  vancomycin (VANCOCIN) 1,750 mg in sodium chloride 0.9 % 500 mL IVPB  Status:  Discontinued     1,750 mg 250 mL/hr over 120 Minutes Intravenous  Once 09/09/18 2209 09/09/18 2231       Assessment/Plan HTN DM-II H/o CVA  Chronic Afib - okay to restart coumadin  Systolic and diastolic heart failure Gout HLD  S/p right inguinal hernia repair 07/25/18 Dr. Harlow Asa Postoperative hematoma Cellulitis  - Bedside I&D by Margie Billet PA-C on 3/2 - Cultures with moderate WBC, predominant PMN, rare gram positive cocci. Await final Cx - Continue IV abx. Will need course of oral abx at discharge. Await Culture results - BID WTD dressing changes - Will need to teach patient and wife how to do dressing changes at home. Appears they have help with retired [place] in family. He is tolerating this with oral pain medication.  - Mobilize and IS  ID - azactam/vancomycin/flagyl 2/29>> WBC 15.5 (3/2), down from 18.2 (3/1). Cx pending. FEN - CM diet VTE - SCDs, okay to restart coumadin from surgical standpoint Foley - none Follow up - Dr. Harlow Asa   LOS: 3 days   Jillyn Ledger , [facility] Surgery 09/12/2018, 9:36 AM Pager: [phone]

## 2018-09-12 NOTE — Care Management Important Message (Signed)
Important Message  Patient Details  Name: Adrian Barrett MRN: [id number] Date of Birth: Jun 08, 1941   Medicare Important Message Given:  Yes    Kerin Salen 09/12/2018, 11:13 AMImportant Message  Patient Details  Name: Adrian Barrett MRN: [id number] Date of Birth: 20-Apr-1941   Medicare Important Message Given:  Yes    Kerin Salen 09/12/2018, 11:13 AM

## 2018-09-12 NOTE — Progress Notes (Signed)
ANTICOAGULATION CONSULT NOTE - Initial Consult  Pharmacy Consult for warfarin Indication: atrial fibrillation  Allergies  Allergen Reactions  . Augmentin [Amoxicillin-Pot Clavulanate] Other (See Comments)    Patient said he had "heart problems" after taking the med and that it happened a long time ago and that he does not know if has had any PCNs or cephalosporins since  DID THE REACTION INVOLVE: Swelling of the face/tongue/throat, SOB, or low BP? Unknown Sudden or severe rash/hives, skin peeling, or the inside of the mouth or nose? Unknown Did it require medical treatment? Unknown When did it last happen?unknown If all above answers are "NO", may proceed with cephalosporin use.     Patient Measurements: Weight: 180 lb 12.4 oz (82 kg)(from [facility] documents 2/29)   Vital Signs: Temp: 97.6 F (36.4 C) (03/03 0620) Temp Source: Oral (03/03 0620) BP: 120/56 (03/03 0620) Pulse Rate: 50 (03/03 0620)  Labs: Recent Labs    09/09/18 2059 09/10/18 0407 09/11/18 0348 09/12/18 0748 09/12/18 1207  HGB 11.5* 10.2* 9.9*  --   --   HCT 35.7* 31.3* 31.5*  --   --   PLT 261 225 257  --   --   LABPROT 23.4*  --  18.4*  --  17.8*  INR 2.1*  --  1.6*  --  1.5*  CREATININE 1.61* 1.38* 1.49* 1.24  --     Estimated Creatinine Clearance: 49.9 mL/min (by C-G formula based on SCr of 1.24 mg/dL).   Medical History: Past Medical History:  Diagnosis Date  . Arthritis    "neck, lower back" (05/16/2017)  . Atrial fibrillation ([place])   . Atrial flutter ([place])   . Congestive heart failure, unspecified    45 - 50%  . COPD (chronic obstructive pulmonary disease) ([place])   . Coronary atherosclerosis of native coronary artery   . Gout    "on daily RX:" (05/16/2017)  . History of ETOH abuse    Quit 20 years ago.   Marland Kitchen Hyperlipidemia   . Mitral valve insufficiency    Mild to mod on echo 2015  . Myocardial infarction ([place]) 1987  . Obesity   . Stroke [facility]) 01/2000   right posterior  middle cerebral artery cerebrovascular accident/notes 51/11/2010; denies residual on 05/16/2017  . Type II diabetes mellitus ([place])   . Unspecified essential hypertension     Medications:  Scheduled:  . allopurinol  300 mg Oral Daily  . atorvastatin  40 mg Oral Daily  . insulin aspart  0-9 Units Subcutaneous Q4H  . sodium chloride flush  3 mL Intravenous Q12H   Infusions:  . sodium chloride    . ceFEPime (MAXIPIME) IV 1 g (09/12/18 0636)  . metronidazole 500 mg (09/12/18 1257)    Assessment: 78 yo male admitted with hematoma s/p right inguinal hernia repair on 07/25/18 currently on cefepime and flagyl for wound infection. To restart warfarin for hx Afib per orders. Patient was reportedly on warfarin 7.5mg  daily except 5mg  on Sundays prior to admission. Note that INR currently subtherapeutic at 1.5. And note that patient with current drug drug interaction of warfarin and Flagyl that will require a dose reduction in warfarin and very close INR follow up as long as flagyl continues concurrently with warfarin.   Goal of Therapy:  INR 2-3   Plan:  1) Warfarin 2.5mg  today at 1800 2) Daily INR   Adrian Saran, PharmD, BCPS Pager [phone] 09/12/2018 1:20 PM

## 2018-09-12 NOTE — Progress Notes (Signed)
Pharmacy Antibiotic Note  Adrian Barrett is a 78 y.o. male admitted on 09/09/2018 with Sepsis/wound infection.  Pharmacy had been consulted for Vancomycin/aztreonam dosing. Aztreonam since changed to cefepime per policy on 3/2. Flagyl per Md  Today, 09/12/18  Day 3 total Abxs  Improved SCr  WBC not checked 3/3, was trending down per 3/2 labs  Afebrile  Wound culture pending from 3/2  Plan: 1) Continue cefepime 1g IV q12 2) Flagyl per Md 3) Reluctant to change vanc dosing despite improvement in SCr as patient is elderly and vanc accumulation likely despite this improvement in renal function - continue vancomycin 1g IV q24 for now 4) Narrow abxs and/or change to PO when possible, hopefully can narrow based on wound culture results to single agent   Weight: 180 lb 12.4 oz (82 kg)(from [facility] documents 2/29)  Temp (24hrs), Avg:98 F (36.7 C), Min:97.6 F (36.4 C), Max:98.4 F (36.9 C)  Recent Labs  Lab 09/09/18 2059 09/09/18 2237 09/10/18 0407 09/11/18 0348 09/12/18 0748  WBC 17.8*  --  18.2* 15.5*  --   CREATININE 1.61*  --  1.38* 1.49* 1.24  LATICACIDVEN  --  1.2 0.8  --   --     Estimated Creatinine Clearance: 49.9 mL/min (by C-G formula based on SCr of 1.24 mg/dL).    Allergies  Allergen Reactions  . Augmentin [Amoxicillin-Pot Clavulanate] Other (See Comments)    Patient said he had "heart problems" after taking the med and that it happened a long time ago and that he does not know if has had any PCNs or cephalosporins since  DID THE REACTION INVOLVE: Swelling of the face/tongue/throat, SOB, or low BP? Unknown Sudden or severe rash/hives, skin peeling, or the inside of the mouth or nose? Unknown Did it require medical treatment? Unknown When did it last happen?unknown If all above answers are "NO", may proceed with cephalosporin use.     Thank you for allowing pharmacy to be a part of this patient's care.  Kara Mead 09/12/2018 10:35 AM

## 2018-09-12 NOTE — Progress Notes (Signed)
PROGRESS NOTE    Adrian Barrett  MOQ:[id number] DOB: 08/03/1940 DOA: 09/09/2018 PCP: Monico Blitz, MD  Brief Narrative:78 y.o.malewith medical history significant of diabetes, atrial fibrillation/flutter, coronary artery disease status post stent on chronic anticoagulation, Chronic systolic CHF, COPD, HLD  [place] of pain and swelling on the right groin. had right inguinal hernia repair on January 14. On presentation, right groin was found to be erythematous, swollen and consistent with findings of cellulitis. Imagings confirmed he has a 10 cm hematoma on that area. Patient was started on empiric broad-spectrum antibiotics.VancomycineGeneral surgery saw him but they were reluctant to do the surgery and asked the hospitalist team over there to transfer the patient here. Hospitalist from [place] have discussed with general surgery, Dr. Leighton Ruff, who agrees with the transfer and general surgery will start following him here when he comes.  Need to cardiology new patient was breech for his hernia repair with Lovenox his Plavix washeld while only onaspirin Regarding pertinent Chronic problems:  Last echo feb 2019 Grade 2 diastolic CHF andSystolic CHF EF 35 - 35% on 60 mg of Lasix Etresto CADHe had a cardiac cath 05/16/17. Cath revealed occluded SVGs and a patent LIMA-LAD. He underwent PCI with DES to his native OM and CFXon plavix, lipitor   DM2 - on Metformin  Hx of A. Fib on coumadinCHA2DS2 - VASc score of 4   Assessment & Plan:   Principal Problem:   Fluid collection at surgical site Active Problems:   Non-insulin dependent type 2 diabetes mellitus (HCC)   Type 2 diabetes mellitus with hyperlipidemia (HCC)   Chronic atrial fibrillation   COPD (chronic obstructive pulmonary disease) (HCC)   Essential hypertension   Acute combined systolic and diastolic heart failure (HCC)   Ischemic cardiomyopathy   Coronary artery  disease involving native coronary artery of native heart with unstable angina pectoris (HCC)   CAD S/P percutaneous coronary angioplasty   Chronic anticoagulation   Right inguinal hernia status post open repair with mesh 07/24/2018   Cellulitis   Hematoma   Sepsis ([place])  Right inguinal hematoma status post recent inguinal hernia repair patient admitted with fever leukocytosis and swelling erythema to the site of incision.  General surgery following status post incision and drainage by general surgery.  Follow-up final culture of the wound.  WBC 15.5 improving.  Patient afebrile.  He is on Vanco cefepime and Flagyl at this time.  MRSA PCR is negative.  Will DC vancomycin continue cefepime and Flagyl follow-up final culture prior to discharge.  HTN STILL SOFT continue to monitor off antihypertensives  TYPE 2 DM takes jardiance glyburide and metformin at home continue SSI.  Hemoglobin A1c 9.1.  CHRONIC AFIB rate controlled.  Will restart Coumadin today.  COMBINED SYSTOLIC AND DIASTOLIC HEART FAILURE-on lasix 60 mg daily at home. Stable currently Lasix on hold due to soft blood pressure monitor closely for fluid overload.  Positive by 2.2 L monitor closely.  Gout on allopurinol  hyperlipedemia on lipitor   DVT prophylaxis:  Coumadin to be restarted 09/12/2018 Code Status: full Family Communication: dw wife Disposition Plan:  Pending clinical improvement  Consultants: surgery   Procedures: none Antimicrobials: azabactam vanco  Estimated body mass index is 25.94 kg/m as calculated from the following:   Height as of 07/25/18: 5\' 10"  (1.778 m).   Weight as of this encounter: 82 kg.   Subjective: Patient resting in bed feels well no nausea vomiting diarrhea  Objective: Vitals:   09/11/18 0437  09/11/18 1348 09/11/18 2147 09/12/18 0620  BP: (!) 101/55 (!) 115/54 120/60 (!) 120/56  Pulse: 64 (!) 55 (!) 56 (!) 50  Resp: 18 18 16 16   Temp: 98.9 F (37.2 C)  98.4 F (36.9 C)  97.6 F (36.4 C)  TempSrc: Oral  Oral Oral  SpO2: 99% 97% 98% 98%  Weight:        Intake/Output Summary (Last 24 hours) at 09/12/2018 1205 Last data filed at 09/12/2018 1000 Gross per 24 hour  Intake 3600.03 ml  Output 1425 ml  Net 2175.03 ml   Filed Weights   09/09/18 2300  Weight: 82 kg    Examination:  General exam: Appears calm and comfortable  Respiratory system: Clear to auscultation. Respiratory effort normal. Cardiovascular system: S1 & S2 heard, RRR. No JVD, murmurs, rubs, gallops or clicks. No pedal edema. Gastrointestinal system: Abdomen is nondistended, soft and nontender. No organomegaly or masses felt. Normal bowel sounds heard.  Right groin dressing saturated with drainage.  The wound itself looks clean and dry from the picture. Central nervous system: Alert and oriented. No focal neurological deficits. Extremities: Symmetric 5 x 5 power. Skin: No rashes, lesions or ulcers Psychiatry: Judgement and insight appear normal. Mood & affect appropriate.     Data Reviewed: I have personally reviewed following labs and imaging studies  CBC: Recent Labs  Lab 09/09/18 2059 09/10/18 0407 09/11/18 0348  WBC 17.8* 18.2* 15.5*  NEUTROABS 14.6*  --   --   HGB 11.5* 10.2* 9.9*  HCT 35.7* 31.3* 31.5*  MCV 99.7 97.5 99.4  PLT 261 225 810   Basic Metabolic Panel: Recent Labs  Lab 09/09/18 2059 09/10/18 0407 09/11/18 0348 09/12/18 0748  NA 135 133* 131* 134*  K 4.1 3.7 3.6 4.2  CL 104 105 105 109  CO2 23 19* 20* 19*  GLUCOSE 222* 160* 219* 153*  BUN 39* 39* 47* 47*  CREATININE 1.61* 1.38* 1.49* 1.24  CALCIUM 8.5* 8.1* 8.0* 8.5*  MG  --  1.8  --   --   PHOS  --  2.3*  --   --    GFR: Estimated Creatinine Clearance: 49.9 mL/min (by C-G formula based on SCr of 1.24 mg/dL). Liver Function Tests: Recent Labs  Lab 09/09/18 2059 09/10/18 0407  AST 38 31  ALT 29 26  ALKPHOS 128* 100  BILITOT 1.0 1.0  PROT 7.0 6.1*  ALBUMIN 3.0* 2.5*   No results for  input(s): LIPASE, AMYLASE in the last 168 hours. No results for input(s): AMMONIA in the last 168 hours. Coagulation Profile: Recent Labs  Lab 09/09/18 2059 09/11/18 0348  INR 2.1* 1.6*   Cardiac Enzymes: No results for input(s): CKTOTAL, CKMB, CKMBINDEX, TROPONINI in the last 168 hours. BNP (last 3 results) No results for input(s): PROBNP in the last 8760 hours. HbA1C: Recent Labs    09/09/18 2059  HGBA1C 9.1*   CBG: Recent Labs  Lab 09/11/18 1956 09/12/18 0020 09/12/18 0357 09/12/18 0737 09/12/18 1146  GLUCAP 222* 194* 172* 154* 203*   Lipid Profile: No results for input(s): CHOL, HDL, LDLCALC, TRIG, CHOLHDL, LDLDIRECT in the last 72 hours. Thyroid Function Tests: Recent Labs    09/10/18 0407  TSH 1.609   Anemia Panel: No results for input(s): VITAMINB12, FOLATE, FERRITIN, TIBC, IRON, RETICCTPCT in the last 72 hours. Sepsis Labs: Recent Labs  Lab 09/09/18 2237 09/10/18 0407  PROCALCITON 0.38  --   LATICACIDVEN 1.2 0.8    Recent Results (from the past 240 hour(s))  Culture, blood (Routine X 2) w Reflex to ID Panel     Status: None (Preliminary result)   Collection Time: 09/09/18 10:22 PM  Result Value Ref Range Status   Specimen Description BLOOD RIGHT ANTECUBITAL  Final   Special Requests   Final    BOTTLES DRAWN AEROBIC AND ANAEROBIC Blood Culture adequate volume Performed at [facility] [address], [place], [place] [address]    Culture NO GROWTH 1 DAY  Final   Report Status PENDING  Incomplete  Culture, blood (Routine X 2) w Reflex to ID Panel     Status: None (Preliminary result)   Collection Time: 09/09/18 10:37 PM  Result Value Ref Range Status   Specimen Description BLOOD RIGHT ANTECUBITAL  Final   Special Requests   Final    BOTTLES DRAWN AEROBIC AND ANAEROBIC Blood Culture adequate volume Performed at [facility] [address], [place], [place] [address]    Culture NO GROWTH 1 DAY  Final    Report Status PENDING  Incomplete  MRSA PCR Screening     Status: None   Collection Time: 09/10/18 12:01 AM  Result Value Ref Range Status   MRSA by PCR NEGATIVE NEGATIVE Final    Comment:        The GeneXpert MRSA Assay (FDA approved for NASAL specimens only), is one component of a comprehensive MRSA colonization surveillance program. It is not intended to diagnose MRSA infection nor to guide or monitor treatment for MRSA infections. Performed at [facility], [place] [address], [place], [place] [address]   Aerobic/Anaerobic Culture (surgical/deep wound)     Status: None (Preliminary result)   Collection Time: 09/11/18 10:00 AM  Result Value Ref Range Status   Specimen Description   Final    WOUND Performed at [facility] [address] [place], [place] [address]    Special Requests   Final    NONE Performed at [facility], [place] [address] [place], [place] [address]    Gram Stain   Final    MODERATE WBC PRESENT, PREDOMINANTLY PMN RARE GRAM POSITIVE COCCI Performed at [facility], [place] [address], [place], [place] [address]    Culture PENDING  Incomplete   Report Status PENDING  Incomplete         Radiology Studies: No results found.      Scheduled Meds: . allopurinol  300 mg Oral Daily  . atorvastatin  40 mg Oral Daily  . insulin aspart  0-9 Units Subcutaneous Q4H  . sodium chloride flush  3 mL Intravenous Q12H   Continuous Infusions: . sodium chloride    . ceFEPime (MAXIPIME) IV 1 g (09/12/18 0636)  . metronidazole 500 mg (09/12/18 0531)  . vancomycin 1,000 mg (09/11/18 1533)     LOS: 3 days     Georgette Shell, MD [facility]  If 7PM-7AM, please contact night-coverage [email or web address] Password [facility] 09/12/2018, 12:05 PM

## 2018-09-12 NOTE — Progress Notes (Signed)
Progress Note  Patient Name: Adrian Barrett Date of Encounter: 09/12/2018  Primary Cardiologist: Minus Breeding, MD   Subjective   No significant overnight events. Patient denies any chest pain or shortness of breath. No significant right groin pain since I&D yesterday.  Inpatient Medications    Scheduled Meds: . allopurinol  300 mg Oral Daily  . atorvastatin  40 mg Oral Daily  . insulin aspart  0-9 Units Subcutaneous Q4H  . sodium chloride flush  3 mL Intravenous Q12H   Continuous Infusions: . sodium chloride    . ceFEPime (MAXIPIME) IV 1 g (09/12/18 0636)  . metronidazole 500 mg (09/12/18 0531)  . vancomycin 1,000 mg (09/11/18 1533)   PRN Meds: sodium chloride, acetaminophen **OR** acetaminophen, albuterol, HYDROcodone-acetaminophen, ondansetron **OR** ondansetron (ZOFRAN) IV, sodium chloride flush, traMADol   Vital Signs    Vitals:   09/11/18 0437 09/11/18 1348 09/11/18 2147 09/12/18 0620  BP: (!) 101/55 (!) 115/54 120/60 (!) 120/56  Pulse: 64 (!) 55 (!) 56 (!) 50  Resp: 18 18 16 16   Temp: 98.9 F (37.2 C)  98.4 F (36.9 C) 97.6 F (36.4 C)  TempSrc: Oral  Oral Oral  SpO2: 99% 97% 98% 98%  Weight:        Intake/Output Summary (Last 24 hours) at 09/12/2018 1017 Last data filed at 09/12/2018 1000 Gross per 24 hour  Intake 3500.03 ml  Output 1425 ml  Net 2075.03 ml   Filed Weights   09/09/18 2300  Weight: 82 kg    Telemetry    Patient not on telemetry. - Personally Reviewed  ECG    No new ECG tracing today. - Personally Reviewed  Physical Exam   GEN: Caucasian male resting comfortably. Alert and in no acute distress.   Neck: Supple. No JVD. Cardiac: Bradycardic with irregularly irregular rhythm. No murmurs, gallops, or rubs. Radial and distal pedal pulse 2+ and equal. Respiratory: Clear to auscultation bilaterally. No significant wheezes, rhonchi, or rales. GI: Abdomen soft, non-distended, and non-tender. Bowel sounds present. MS: Bandage over  right groin wound. No lower extremity edema. No deformity. Skin: Warm and dry. Neuro:  No focal deficits. Psych: Normal affect. Responds appropriately.  Labs    Chemistry Recent Labs  Lab 09/09/18 2059 09/10/18 0407 09/11/18 0348 09/12/18 0748  NA 135 133* 131* 134*  K 4.1 3.7 3.6 4.2  CL 104 105 105 109  CO2 23 19* 20* 19*  GLUCOSE 222* 160* 219* 153*  BUN 39* 39* 47* 47*  CREATININE 1.61* 1.38* 1.49* 1.24  CALCIUM 8.5* 8.1* 8.0* 8.5*  PROT 7.0 6.1*  --   --   ALBUMIN 3.0* 2.5*  --   --   AST 38 31  --   --   ALT 29 26  --   --   ALKPHOS 128* 100  --   --   BILITOT 1.0 1.0  --   --   GFRNONAA 40* 48* 44* 55*  GFRAA 46* 56* 51* >60  ANIONGAP 8 9 6 6      Hematology Recent Labs  Lab 09/09/18 2059 09/10/18 0407 09/11/18 0348  WBC 17.8* 18.2* 15.5*  RBC 3.58* 3.21* 3.17*  HGB 11.5* 10.2* 9.9*  HCT 35.7* 31.3* 31.5*  MCV 99.7 97.5 99.4  MCH 32.1 31.8 31.2  MCHC 32.2 32.6 31.4  RDW 13.6 13.6 13.8  PLT 261 225 257    Cardiac EnzymesNo results for input(s): TROPONINI in the last 168 hours. No results for input(s): TROPIPOC in the last  168 hours.   BNPNo results for input(s): BNP, PROBNP in the last 168 hours.   DDimer No results for input(s): DDIMER in the last 168 hours.   Radiology    No results found.  Cardiac Studies   Right/Left Heart Catheterization 05/16/2017:  LESION #1: Prox Cx lesion is 75% stenosed.  A drug-eluting stent was successfully placed using a STENT SIERRA 3.00 X 12 MM.  Post intervention, there is a 0% residual stenosis.  Using a BALLOON EMERGE MR 2.5X8.  LESION #2: Mid Cx (prox OM) lesion is 85% (focal) after AV Groove branch  A drug-eluting stent was successfully placed using a STENT SIERRA 3.00 X 08 MM.  Post intervention, there is a 5% residual stenosis.  Prox RCA to Dist RCA lesion is 100% stenosed. SVG-RPDA origin to Prox Graft lesion is 100% stenosed.  Prox LAD to Mid LAD lesion is 100% stenosed.  LIMA-dLAD is widely  patent with good runoff distally. -Very difficult to engage  SVG-OM & SVG-RI(Diag)Origin to Prox Graft lesion is 100% stenosed.  Hemodynamic findings consistent with severe pulmonary hypertension.  LV end diastolic pressure is moderately elevated.   Severe native vessel disease with occluded RCA and LAD.  SVG grafts to presumably the RPDA and OM as well as either ramus or diagonal branch are all occluded. LIMA-LAD is patent. Significant left to right collaterals via AV groove branch.   The circumflex has proximal and mid (lateral OM) of 75 and 85% respectively.  Both were treated with separate DES stents thereby avoiding potential jailing of the ostium of the AV groove circumflex -which provides collaterals to the RCA.  The patient has severe CHF and secondary pulmonary hypertension, and will likely require aggressive diuresis.  Plan:  Overnight observation post procedure, but low threshold for admitting for short-term diuresis. --I have written for Lasix 40 mill grams IV twice daily starting this afternoon  Restart warfarin tonight along with aspirin and Plavix.  Continue DAPT for 3 months and then stop aspirin -continue Plavix plus warfarin  Dr. Percival Spanish started low-dose carvedilol.  We will continue for now, but he was bradycardic. _______________  Echocardiogram 08/29/2017: Study Conclusions: - Left ventricle: The cavity size was normal. There was mild   concentric hypertrophy. Systolic function was moderately reduced.   The estimated ejection fraction was in the range of 35% to 40%.   Severe hypokinesis of the inferolateral and inferior myocardium;   consistent with infarction in the distribution of the right   coronary and left circumflex coronary artery. Features are   consistent with a pseudonormal left ventricular filling pattern,   with concomitant abnormal relaxation and increased filling   pressure (grade 2 diastolic dysfunction). Acoustic contrast   opacification  revealed no evidence ofthrombus. Acoustic contrast   opacification revealed no evidence ofthrombus. - Mitral valve: Calcified annulus. There was mild to moderate   regurgitation directed centrally. - Left atrium: The atrium was severely dilated. - Right ventricle: The cavity size was dilated. Systolic function   was moderately reduced. - Right atrium: The atrium was mildly dilated. - Pulmonary arteries: Systolic pressure was moderately increased.   PA peak pressure: 57 mm Hg (S). - Pericardium, extracardiac: A trivial pericardial effusion was   identified.   Patient Profile   Adrian Barrett is a 78 y.o. male with a history of CAD s/p CABG with most recent cardiac catheterization in 05/2017 showing occluded SVGs and patent LIMA to LAD s/p PCI of the native OM and left circumflex, ischemic cardiomyopathy  with EF of 35-40% on Echo in 08/2017, chronic atrial fibrillation on Warfarin, chronic bradycardia with heart rates in the mid 40's, hypertension, hyperlipidemia, and carotid stenosis who is being seen today for the evaluation of guidance with anticoagulation in the setting of right groin hematoma following recent right inguinal hernia repair at the request of Dr.Matthews.  Assessment & Plan    Right Groin Hematoma  - Related to recent inguinal hernia repair.  - CT showed a 10 cm hematoma in the right groin. - Continue to hold Plavix. OK to restart Coumadin per surgery. - Management per surgery.  Sepsis Secondary to Cellulitis of Right Groin - Management per primary team and surgery.  CAD  - Patient has a history of remote MI and CABG. Most recent cardiac catheterization in 05/2017 showed occluded SVGs and patent LIMA to LAD. Patient underwent PCI of the native OM and left circumflex at that time.  - Patient on Plavix 75mg  daily at home. He is not on any Aspirin at home due to Coumadin. - OK to continue to hold Plavix in the setting of acute groin bleed with large hematoma, especially  since he is more than a year out from PCI. Restart when OK from surgery standpoint and once INR is stable after restarting Coumadin. - Continue home statin. - Patient is not on any beta-blocker at home due to chronic bradycardia.   Chronic Systolic CHF / Ischemic Cardiomyopathy - LVEF of 35-40% on Echo in 08/2017. - Patient does not appear to be volume overloaded on exam.  - Home Entresto and Lasix currently being held due to sepsis and renal function. Serum creatinine improved at 1.24 today (down from 1.49 yesterday). Baseline appears to be 1.2 to 1.6.  - BP has been on the softer side. Most recent BP 120/56. OK to restart Entresto as BP allows. Would make sure patient tolerates Entresto before adding back Lasix. - Patient not on any beta-blockers due to chronic bradycardia. - Will continue to monitor volume status.   Chronic Atrial Fibrillation - Heart rate controlled on exam. Patient is not on telemetry. - Not on any rate control medications due to history of chronic bradycardia. - Per surgery note today, OK to restart Coumadin.   Otherwise, per primary team.   For questions or updates, please contact [place] Please consult [email or web address] for contact info under Cardiology/STEMI.      Signed, Darreld Mclean, PA-C  09/12/2018, 10:17 AM

## 2018-09-13 DIAGNOSIS — K4090 Unilateral inguinal hernia, without obstruction or gangrene, not specified as recurrent: Secondary | ICD-10-CM

## 2018-09-13 LAB — GLUCOSE, CAPILLARY
GLUCOSE-CAPILLARY: 175 mg/dL — AB (ref 70–99)
Glucose-Capillary: 167 mg/dL — ABNORMAL HIGH (ref 70–99)
Glucose-Capillary: 184 mg/dL — ABNORMAL HIGH (ref 70–99)
Glucose-Capillary: 215 mg/dL — ABNORMAL HIGH (ref 70–99)

## 2018-09-13 LAB — CBC
HCT: 32.5 % — ABNORMAL LOW (ref 39.0–52.0)
HEMOGLOBIN: 10.2 g/dL — AB (ref 13.0–17.0)
MCH: 31.8 pg (ref 26.0–34.0)
MCHC: 31.4 g/dL (ref 30.0–36.0)
MCV: 101.2 fL — ABNORMAL HIGH (ref 80.0–100.0)
Platelets: 284 10*3/uL (ref 150–400)
RBC: 3.21 MIL/uL — ABNORMAL LOW (ref 4.22–5.81)
RDW: 14.6 % (ref 11.5–15.5)
WBC: 7.8 10*3/uL (ref 4.0–10.5)
nRBC: 0 % (ref 0.0–0.2)

## 2018-09-13 LAB — CREATININE, SERUM
Creatinine, Ser: 1.44 mg/dL — ABNORMAL HIGH (ref 0.61–1.24)
GFR calc Af Amer: 53 mL/min — ABNORMAL LOW (ref >60)
GFR calc non Af Amer: 46 mL/min — ABNORMAL LOW (ref >60)

## 2018-09-13 LAB — PROTIME-INR
INR: 1.5 — ABNORMAL HIGH (ref 0.8–1.2)
Prothrombin Time: 17.6 seconds — ABNORMAL HIGH (ref 11.4–15.2)

## 2018-09-13 MED ORDER — WARFARIN SODIUM 5 MG PO TABS: 5.0000 mg | ORAL_TABLET | Freq: Once | ORAL | Status: DC

## 2018-09-13 MED ORDER — SACUBITRIL-VALSARTAN 24-26 MG PO TABS
1.0000 | ORAL_TABLET | Freq: Two times a day (BID) | ORAL | Status: DC
  Administered 2018-09-13: 1 via ORAL
  Filled 2018-09-13: qty 1

## 2018-09-13 MED ORDER — SACUBITRIL-VALSARTAN 24-26 MG PO TABS: 1.0000 | ORAL_TABLET | Freq: Two times a day (BID) | ORAL | 0 refills | Status: DC

## 2018-09-13 MED ORDER — DOCUSATE SODIUM 100 MG PO CAPS
100.0000 mg | ORAL_CAPSULE | Freq: Two times a day (BID) | ORAL | Status: DC
  Administered 2018-09-13: 100 mg via ORAL
  Filled 2018-09-13: qty 1

## 2018-09-13 MED ORDER — POLYETHYLENE GLYCOL 3350 17 G PO PACK: 17.0000 g | PACK | Freq: Every day | ORAL | Status: DC | PRN

## 2018-09-13 MED ORDER — CEPHALEXIN 500 MG PO CAPS
500.0000 mg | ORAL_CAPSULE | Freq: Once | ORAL | Status: AC
  Administered 2018-09-13: 500 mg via ORAL
  Filled 2018-09-13: qty 1

## 2018-09-13 MED ORDER — DOXYCYCLINE HYCLATE 100 MG PO CAPS: 100.0000 mg | ORAL_CAPSULE | Freq: Two times a day (BID) | ORAL | 0 refills | Status: DC

## 2018-09-13 MED ORDER — CEPHALEXIN 500 MG PO CAPS: 500.0000 mg | ORAL_CAPSULE | Freq: Two times a day (BID) | ORAL | 0 refills | Status: DC

## 2018-09-13 NOTE — Progress Notes (Signed)
Subjective: CC: Right groin pain Patient with right groin pain overnight with dressing changes. Feels this is mild otherwise. Tolerating pain and dressing changes with oral medications. Wife at bedside and continues to actively learn how to change packing. Wife's sister is a retired PA and family report she has agreed to help at home. Patient did feels warm and have some sweats overnight. No fevers documented (not on scheduled antipyretics). Denies N/V. Reports he feels he is wheezing some, has not been using IS frequently.  Tolerating diet, passing flatus. Havings small BM's.   Objective: Vital signs in last 24 hours: Temp:  [97.7 F (36.5 C)-98.4 F (36.9 C)] 98 F (36.7 C) (03/04 5643) Pulse Rate:  [52-104] 104 (03/04 0608) Resp:  [18] 18 (03/04 0608) BP: (112-126)/(50-59) 126/57 (03/04 0608) SpO2:  [97 %-98 %] 98 % (03/04 0608) Last BM Date: 09/12/18  Intake/Output from previous day: 03/03 0701 - 03/04 0700 In: 2020 [P.O.:1440; I.V.:80; IV Piggyback:500] Out: 2050 [Urine:2050] Intake/Output this shift: No intake/output data recorded.  PE: Gen: Awake and alert, NAD Heart: Irregular rhythm with regular rate Lungs: Faint expiratory wheezing, R>L. Normal effort Abd: Soft, ND, NT, +BS Skin: Right groin incision packing removed. Small amount of purulence on Kerlex. Wound measures 8cm long x 5cm deep. There was a small area of false bottom that was probed with q-tip and met with small amount of purulence. This measured 6cm from surface. No further purulent drainage.  It appears clean and dry (see pictures below). There is a 2cm tract medially and laterally at the base. There is surrounding cellulitis that is erythematous, blanchable, warm to touch without induration or fluctuance. This was marked yesterday by myself and appears to be receeding. No areas extending beyond area marked. Patient feels cellulitis continues to improve. Wound was repacked with saline dampened kerlex and  covered with dry abd pad.  Msk: No edema      Lab Results:  Recent Labs    09/11/18 0348 09/13/18 0440  WBC 15.5* 7.8  HGB 9.9* 10.2*  HCT 31.5* 32.5*  PLT 257 284   BMET Recent Labs    09/11/18 0348 09/12/18 0748 09/13/18 0440  NA 131* 134*  --   K 3.6 4.2  --   CL 105 109  --   CO2 20* 19*  --   GLUCOSE 219* 153*  --   BUN 47* 47*  --   CREATININE 1.49* 1.24 1.44*  CALCIUM 8.0* 8.5*  --    PT/INR Recent Labs    09/12/18 1207 09/13/18 0440  LABPROT 17.8* 17.6*  INR 1.5* 1.5*   CMP     Component Value Date/Time   NA 134 (L) 09/12/2018 0748   NA 141 02/23/2018 0841   K 4.2 09/12/2018 0748   CL 109 09/12/2018 0748   CO2 19 (L) 09/12/2018 0748   GLUCOSE 153 (H) 09/12/2018 0748   BUN 47 (H) 09/12/2018 0748   BUN 40 (H) 02/23/2018 0841   CREATININE 1.44 (H) 09/13/2018 0440   CALCIUM 8.5 (L) 09/12/2018 0748   PROT 6.1 (L) 09/10/2018 0407   PROT 7.4 02/03/2018 0802   ALBUMIN 2.5 (L) 09/10/2018 0407   ALBUMIN 4.6 02/03/2018 0802   AST 31 09/10/2018 0407   ALT 26 09/10/2018 0407   ALKPHOS 100 09/10/2018 0407   BILITOT 1.0 09/10/2018 0407   BILITOT 0.5 02/03/2018 0802   GFRNONAA 46 (L) 09/13/2018 0440   GFRAA 53 (L) 09/13/2018 0440   Lipase  No results found for: LIPASE     Studies/Results: No results found.  Anti-infectives: Anti-infectives (From admission, onward)   Start     Dose/Rate Route Frequency Ordered Stop   09/11/18 1800  ceFEPIme (MAXIPIME) 1 g in sodium chloride 0.9 % 100 mL IVPB     1 g 200 mL/hr over 30 Minutes Intravenous Every 12 hours 09/11/18 1231     09/10/18 1600  vancomycin (VANCOCIN) IVPB 1000 mg/200 mL premix  Status:  Discontinued     1,000 mg 200 mL/hr over 60 Minutes Intravenous Every 24 hours 09/09/18 2321 09/12/18 1214   09/10/18 1000  aztreonam (AZACTAM) 2 g in sodium chloride 0.9 % 100 mL IVPB  Status:  Discontinued     2 g 200 mL/hr over 30 Minutes Intravenous Every 12 hours 09/09/18 2322 09/09/18 2323    09/10/18 0600  aztreonam (AZACTAM) 2 g in sodium chloride 0.9 % 100 mL IVPB  Status:  Discontinued     2 g 200 mL/hr over 30 Minutes Intravenous Every 8 hours 09/09/18 2323 09/11/18 1228   09/09/18 2300  metroNIDAZOLE (FLAGYL) IVPB 500 mg     500 mg 100 mL/hr over 60 Minutes Intravenous Every 8 hours 09/09/18 2233     09/09/18 2300  aztreonam (AZACTAM) 2 GM IVPB  Status:  Discontinued     2 g 100 mL/hr over 30 Minutes Intravenous  Once 09/09/18 2246 09/09/18 2249   09/09/18 2300  aztreonam (AZACTAM) 2 g in sodium chloride 0.9 % 100 mL IVPB     2 g 200 mL/hr over 30 Minutes Intravenous  Once 09/09/18 2249 09/09/18 2354   09/09/18 2215  vancomycin (VANCOCIN) 1,750 mg in sodium chloride 0.9 % 500 mL IVPB  Status:  Discontinued     1,750 mg 250 mL/hr over 120 Minutes Intravenous  Once 09/09/18 2209 09/09/18 2231       Assessment/Plan HTN DM-II H/o CVA  Chronic Afib - okay to restart coumadin  Systolic and diastolic heart failure Gout HLD  S/p right inguinal hernia repair 07/25/18 Dr. Harlow Asa Postoperative hematoma Cellulitis  - Bedside I&D by Margie Billet PA-C on 3/2 - Cultures with moderate WBC, predominant PMN, rare gram positive cocci. Await final Cx. Cx was reincubated. He was taken off Vanc overnight.  - Continue IV abx. Will need course of oral abx at discharge. Await Culture results - Cellulitis improving - Continue BID WTD dressing changes - Will need to teach patient and wife how to do dressing changes at home. Appears they have help with retired [employer] in family. He is tolerating this with oral pain medication.  - Mobilize and IS  ID -Vanc 2/29 - 3/3; azactam/flagyl 2/29>> WBC normalized, 7.8. Cx pending. FEN -CM diet, bowel regimen VTE -SCDs, coumadin per cardiology  Foley -none Follow up -Dr. Harlow Asa   LOS: 4 days    Jillyn Ledger , [facility] 09/13/2018, 8:45 AM Pager: [phone]

## 2018-09-13 NOTE — Progress Notes (Signed)
Discharge instructions discussed with patient and family, verbalized agreement and understanding 

## 2018-09-13 NOTE — Progress Notes (Signed)
Progress Note  Patient Name: Adrian Barrett Date of Encounter: 09/13/2018  Primary Cardiologist: Minus Breeding, MD   Subjective   Doing well, looking forward to going home. Reviewed med recommendations at length.  Inpatient Medications    Scheduled Meds: . allopurinol  300 mg Oral Daily  . atorvastatin  40 mg Oral Daily  . cephALEXin  500 mg Oral Once  . docusate sodium  100 mg Oral BID  . insulin aspart  0-9 Units Subcutaneous Q4H  . sacubitril-valsartan  1 tablet Oral BID  . sodium chloride flush  3 mL Intravenous Q12H  . warfarin  5 mg Oral ONCE-1800  . Warfarin - Pharmacist Dosing Inpatient   Does not apply q1800   Continuous Infusions: . sodium chloride    . metronidazole Stopped (09/13/18 1025)   PRN Meds: sodium chloride, acetaminophen **OR** acetaminophen, albuterol, HYDROcodone-acetaminophen, ondansetron **OR** ondansetron (ZOFRAN) IV, polyethylene glycol, sodium chloride flush, traMADol   Vital Signs    Vitals:   09/12/18 0620 09/12/18 1348 09/12/18 2110 09/13/18 0608  BP: (!) 120/56 (!) 112/50 (!) 120/59 (!) 126/57  Pulse: (!) 50 (!) 52 63 (!) 104  Resp: 16 18 18 18   Temp: 97.6 F (36.4 C) 97.7 F (36.5 C) 98.4 F (36.9 C) 98 F (36.7 C)  TempSrc: Oral Oral Oral Oral  SpO2: 98% 98% 97% 98%  Weight:        Intake/Output Summary (Last 24 hours) at 09/13/2018 1216 Last data filed at 09/13/2018 0907 Gross per 24 hour  Intake 1760 ml  Output 1925 ml  Net -165 ml   Last 3 Weights 09/09/2018 07/25/2018 07/19/2018  Weight (lbs) 180 lb 12.4 oz 182 lb 180 lb  Weight (kg) 82 kg 82.555 kg 81.647 kg      Telemetry    none - Personally Reviewed  ECG    No new - Personally Reviewed  Physical Exam   GEN: No acute distress.  Resting comfortably in bed. Neck: No JVD Cardiac: bradycardic, irregularly irregular, no murmurs, rubs, or gallops.  Respiratory: Clear to auscultation bilaterally. GI: Soft, nontender, non-distended. R groin bandage c/d/i MS: No  edema; No deformity. Neuro:  Nonfocal  Psych: Normal affect   Labs    Chemistry Recent Labs  Lab 09/09/18 2059 09/10/18 0407 09/11/18 0348 09/12/18 0748 09/13/18 0440  NA 135 133* 131* 134*  --   K 4.1 3.7 3.6 4.2  --   CL 104 105 105 109  --   CO2 23 19* 20* 19*  --   GLUCOSE 222* 160* 219* 153*  --   BUN 39* 39* 47* 47*  --   CREATININE 1.61* 1.38* 1.49* 1.24 1.44*  CALCIUM 8.5* 8.1* 8.0* 8.5*  --   PROT 7.0 6.1*  --   --   --   ALBUMIN 3.0* 2.5*  --   --   --   AST 38 31  --   --   --   ALT 29 26  --   --   --   ALKPHOS 128* 100  --   --   --   BILITOT 1.0 1.0  --   --   --   GFRNONAA 40* 48* 44* 55* 46*  GFRAA 46* 56* 51* >60 53*  ANIONGAP 8 9 6 6   --      Hematology Recent Labs  Lab 09/10/18 0407 09/11/18 0348 09/13/18 0440  WBC 18.2* 15.5* 7.8  RBC 3.21* 3.17* 3.21*  HGB 10.2* 9.9* 10.2*  HCT 31.3* 31.5* 32.5*  MCV 97.5 99.4 101.2*  MCH 31.8 31.2 31.8  MCHC 32.6 31.4 31.4  RDW 13.6 13.8 14.6  PLT 225 257 284    Cardiac EnzymesNo results for input(s): TROPONINI in the last 168 hours. No results for input(s): TROPIPOC in the last 168 hours.   BNPNo results for input(s): BNP, PROBNP in the last 168 hours.   DDimer No results for input(s): DDIMER in the last 168 hours.   Radiology    No results found.  Cardiac Studies   Prior personally reviewed, no new this admission  Patient Profile     78 y.o. male with chronic systolic heart failure 2/2 ischemic cardiomyopathy, permanent atrial fibrillation on coumadin, prior MI/CABG on chronic clopidogrel who presented with R groin hematoma and cellulitis ->sepsis. We are consulted for anticoagulation and medication assistance.  Assessment & Plan    R groin hematoma after recent inguinal hernia repair: given large size of hematoma and need for I&D, being conservative with anticoagulation -allowing INR to come up on coumadin without bridging -holding clopidogrel until he is stable without bleeding on a  therapeutic INR -discussed risk/benefit of not bridging until therapeutic, we all agree that preventing another bleed is most important in the short term  Sepsis 2/2 cellulitis of R groin: per primary team  CAD: no active symptoms. Remote history of MI/CABG. On lifelong clopidogrel, no stents since 2018 -discussed risk/benefit of clopidogrel, both holding and prescribing. Understands risk, but given large hematoma and I&D, will favor avoidance of bleeding. Once INR stable with no bleeding, restart clopidogrel. -continue statin -no beta blocker given bradycardia  Chronic systolic heart failure 2/2 ischemic cardiomyopathy -last echo LVEF 35-40% -euvolemic on exam -initially entresto and lasix held due to sepsis. Restarting lower dose of entresto today as BP likely will not tolerate return to high dose. Can uptitrate this back up as an outpatient to return to prior dose -no beta blocker due to bradycardia -no lasix yet, monitor weights as outpatient, call the office for weight gain. May be able to restart at outpatient follow up.  Atrial fibrillation, on coumadin: anticoagulation as above. No rate control agents given baseline bradycardia.  [facility] will sign off in anticipation of discharge.   Medication Recommendations:  Coumadin as directed (allow to drift up, no bridge). Continue atorvastatin 40 mg. Hold lasix, but call if weight increased or shortness of breath develops before follow up. Hold clopidogrel until INR therapeutic and stable, and there is no further bleeding. Restart entresto at low dose 25-26 mg BID (not home dose of 97-103 mg), will uptitrate as outpatient as BP allows.  Other recommendations (labs, testing, etc):  Coumadin per his INR clinic at Dr. Trena Platt office Follow up as an outpatient:  We will arrange follow up with Dr. Rosezella [place] office  For questions or updates, please contact [facility] Please consult [email or web address] for contact info under      Signed, Buford Dresser, MD  09/13/2018, 12:16 PM

## 2018-09-13 NOTE — Progress Notes (Signed)
[facility] for warfarin Indication: atrial fibrillation  Allergies  Allergen Reactions  . Augmentin [Amoxicillin-Pot Clavulanate] Other (See Comments)    Patient said he had "heart problems" after taking the med and that it happened a long time ago and that he does not know if has had any PCNs or cephalosporins since  DID THE REACTION INVOLVE: Swelling of the face/tongue/throat, SOB, or low BP? Unknown Sudden or severe rash/hives, skin peeling, or the inside of the mouth or nose? Unknown Did it require medical treatment? Unknown When did it last happen?unknown If all above answers are "NO", may proceed with cephalosporin use.    Patient Measurements: Weight: 180 lb 12.4 oz (82 kg)(from [place] documents 2/29)  Vital Signs: Temp: 98 F (36.7 C) (03/04 0608) Temp Source: Oral (03/04 0608) BP: 126/57 (03/04 7026) Pulse Rate: 104 (03/04 0608)  Labs: Recent Labs    09/11/18 0348 09/12/18 0748 09/12/18 1207 09/13/18 0440  HGB 9.9*  --   --  10.2*  HCT 31.5*  --   --  32.5*  PLT 257  --   --  284  LABPROT 18.4*  --  17.8* 17.6*  INR 1.6*  --  1.5* 1.5*  CREATININE 1.49* 1.24  --  1.44*   Estimated Creatinine Clearance: 42.9 mL/min (A) (by C-G formula based on SCr of 1.44 mg/dL (H)).  Medical History: Past Medical History:  Diagnosis Date  . Arthritis    "neck, lower back" (05/16/2017)  . Atrial fibrillation ([place])   . Atrial flutter ([place])   . Congestive heart failure, unspecified    45 - 50%  . COPD (chronic obstructive pulmonary disease) ([place])   . Coronary atherosclerosis of native coronary artery   . Gout    "on daily RX:" (05/16/2017)  . History of ETOH abuse    Quit 20 years ago.   Marland Kitchen Hyperlipidemia   . Mitral valve insufficiency    Mild to mod on echo 2015  . Myocardial infarction ([place]) 1987  . Obesity   . Stroke [facility]) 01/2000   right posterior middle cerebral artery cerebrovascular accident/notes 51/11/2010; denies  residual on 05/16/2017  . Type II diabetes mellitus ([place])   . Unspecified essential hypertension     Medications:  Scheduled:  . allopurinol  300 mg Oral Daily  . atorvastatin  40 mg Oral Daily  . docusate sodium  100 mg Oral BID  . insulin aspart  0-9 Units Subcutaneous Q4H  . sodium chloride flush  3 mL Intravenous Q12H  . Warfarin - Pharmacist Dosing Inpatient   Does not apply q1800   Infusions:  . sodium chloride    . ceFEPime (MAXIPIME) IV 1 g (09/13/18 0627)  . metronidazole Stopped (09/13/18 3785)    Assessment: 9 yoM admitted with hematoma s/p right inguinal hernia repair on 07/25/18, on cefepime and flagyl for wound infection. 3/3: Restart warfarin for hx Afib per orders. Home dose Warfarin 7.5mg  daily except 5mg  Su, LD 2/27 - INR 1.5 - Cefepime and Flagyl may increase INR  Today, 09/13/2018 INR 1.5 after 2.5mg  Warfarin, as expected D4 abx, good po intake  Goal of Therapy:  INR 2-3   Plan:   Warfarin 5mg  today at 1800  Daily Protime/INR  Monitor CBC, s/s bleed  Minda Ditto PharmD Pager [phone] 09/13/2018, 10:35 AM

## 2018-09-13 NOTE — Discharge Summary (Addendum)
Physician Discharge Summary  Adrian Barrett YOV:[id number] DOB: August 06, 1940 DOA: 09/09/2018  PCP: Monico Blitz, MD  Admit date: 09/09/2018 Discharge date: 09/14/2018  Admitted From: Home Disposition: Home   Recommendations for Outpatient Follow-up:  1. Follow up with PCP in 1-2 weeks 2. Monitor INR closely, in the next few days. 3. Monitor BP, lowered dose of entresto due to soft BPs while admitted 4. Follow up with Dr. Percival Spanish to be arranged by cardiology.  Home Health: None Equipment/Devices: None Discharge Condition: Stable CODE STATUS: Full Diet recommendation: Heart healthy, carb-modified  Brief/Interim Summary: Adrian Barrett is a 78 y.o. male with a history of ASCAD s/p remote MI 29 and subsequent CABG and most recent cath  05/2017 showing occluded SVGs and patent LIMA to LAD s/p PCI of native OM and LCx.  He has a known ischemic DCM with EF 35-40% on echo 08/2017.  He also has chronic atrial fibrillation on chronic anticoagulation with Eliquis.  He has chronic bradycardia with HRs in the mid 40's that he has tolerated well.  Other med hx includes HTN, hyperlipidemia, carotid stenosis, chronic systolic CHF and DM.    He had a right inguinal hernia repair 07/25/2018 and presented to [facility] with complaints of right groin pain and was found to have cellulitis of the right groin wound with a 10cm hematoma in the area. Started on broad spectrum antibiotics and transferred to [facility] for surgical evaluation. It opened spontaneously after arrival and was washed out, opened further by general surgery 3/2. INR had been noted to be 2.4-2.8 and creatinine ater fluids at 1.43.    Discharge Diagnoses:  Principal Problem:   Fluid collection at surgical site Active Problems:   Non-insulin dependent type 2 diabetes mellitus (HCC)   Type 2 diabetes mellitus with hyperlipidemia (HCC)   Chronic atrial fibrillation   COPD (chronic obstructive pulmonary disease) (HCC)   Essential  hypertension   Acute combined systolic and diastolic heart failure (HCC)   Ischemic cardiomyopathy   Coronary artery disease involving native coronary artery of native heart with unstable angina pectoris (HCC)   CAD S/P percutaneous coronary angioplasty   Chronic anticoagulation   Right inguinal hernia status post open repair with mesh 07/24/2018   Cellulitis   Hematoma   Sepsis ([place])  Right groin hematoma, sepsis (POA, since resolved) due to abscess and cellulitis due to GBS s/p inguinal hernia repair: s/p washout at bedside by surgery 3/2. 10cm hematoma noted on CT.  - Will give 2 weeks antibiotics. Doxycycline initially requested per surgery, though wound culture ultimately grew GBS. Discussed with ID that keflex would be a more ideal agent, this was ok'ed by surgery. - W > D BID, has PA in the family.  - Restarted coumadin, ok by surgery - Holding plavix until INR therapeutic and no bleeding - Follow up with Dr. Harlow Asa as arranged by surgery  CAD:  - Continue home medications except as above  Chronic HFrEF, ICM: EF 35-40%, euvolemic - Cardiology recommends baby dose entresto to replace higher dose PTA, will titrate as outpatient, follow up with Dr. Percival Spanish.   Chronic AFib: CHA2DS2-VASc score is 6. Rate controlled - Restart coumadin, no bridge.  IDT2DM: Continue home medications  COPD: Continue home medications.   HTN: Currently softer BPs.   Discharge Instructions Discharge Instructions    Diet - low sodium heart healthy   Complete by:  As directed    Discharge instructions   Complete by:  As directed    - Start  taking keflex twice daily for the next 2 weeks. The initial plan was to take doxycycline but the wound culture results have shown group B strep infection which is more susceptible to keflex. Please do not pick up prescription for doxycycline. - Follow up with surgery, Dr. Harlow Asa next week. Call to verify appointment. - Continue wet to dry dressing changes twice  daily. - Continue taking coumadin as you were. You are at an increased risk of stroke due to AFib while your INR is below 2. It is currently 1.5 but will come up with restarting coumadin. It is important to follow up with your primary doctor on Monday to recheck INR and make adjustments if needed to the dose. Do NOT take plavix until instructed to do so by your doctor.  - Due to lower blood pressure, your dose of entresto has been decreased per cardiology recommendations. A new prescription is sent to your pharmacy. Please follow up with your primary doctor and/or cardiologist.  - If you notice pain, redness, increasing discharge from the wound or uncontrolled bleeding or fever, seek medical attention right away.   Increase activity slowly   Complete by:  As directed      Allergies as of 09/13/2018      Reactions   Augmentin [amoxicillin-pot Clavulanate] Other (See Comments)   Patient said he had "heart problems" after taking the med and that it happened a long time ago and that he does not know if has had any PCNs or cephalosporins since DID THE REACTION INVOLVE: Swelling of the face/tongue/throat, SOB, or low BP? Unknown Sudden or severe rash/hives, skin peeling, or the inside of the mouth or nose? Unknown Did it require medical treatment? Unknown When did it last happen?unknown If all above answers are "NO", may proceed with cephalosporin use.      Medication List    STOP taking these medications   sacubitril-valsartan 97-103 MG Commonly known as:  Entresto Replaced by:  sacubitril-valsartan 24-26 MG     TAKE these medications   albuterol 108 (90 Base) MCG/ACT inhaler Commonly known as:  PROVENTIL HFA;VENTOLIN HFA Inhale 2 puffs into the lungs every 6 (six) hours as needed for wheezing or shortness of breath.   allopurinol 300 MG tablet Commonly known as:  ZYLOPRIM Take 300 mg by mouth daily.   atorvastatin 40 MG tablet Commonly known as:  LIPITOR TAKE 1 TABLET BY MOUTH  ONCE DAILY   cephALEXin 500 MG capsule Commonly known as:  KEFLEX Take 1 capsule (500 mg total) by mouth 2 (two) times daily.   clopidogrel 75 MG tablet Commonly known as:  PLAVIX Take 1 tablet (75 mg total) by mouth daily with breakfast.   Fish Oil 1000 MG Caps Take 1,000 mg by mouth daily.   furosemide 20 MG tablet Commonly known as:  LASIX Take 3 tablets (60 mg total) by mouth daily.   glyBURIDE 5 MG tablet Commonly known as:  DIABETA Take 5 mg by mouth at bedtime.   HYDROcodone-acetaminophen 5-325 MG tablet Commonly known as:  NORCO/VICODIN One tablet every four hours as needed for acute pain.  Limit of five days per [place]. What changed:    how much to take  how to take this  when to take this  reasons to take this  additional instructions   Ibuprofen PM 200-25 MG Caps Generic drug:  Ibuprofen-diphenhydrAMINE HCl Take 2 tablets by mouth at bedtime as needed (sleep).   Jardiance 25 MG Tabs tablet Generic drug:  empagliflozin Take 25 mg by mouth at bedtime.   metFORMIN 500 MG tablet Commonly known as:  GLUCOPHAGE Take 500-1,000 mg by mouth See admin instructions. Pt takes 1000mg  AM and 500mg  PM   nitroGLYCERIN 0.4 MG SL tablet Commonly known as:  NITROSTAT Place 1 tablet (0.4 mg total) under the tongue every 5 (five) minutes x 3 doses as needed for chest pain. If no relief after the 3rd dose, proceed to the ED for an evaluation   sacubitril-valsartan 24-26 MG Commonly known as:  ENTRESTO Take 1 tablet by mouth 2 (two) times daily. Replaces:  sacubitril-valsartan 97-103 MG   warfarin 5 MG tablet Commonly known as:  COUMADIN Take 5-7.5 mg by mouth See admin instructions. Takes 5 mg in the evening on Sun, take 7.5 mg in the evening Mon through Sat      Follow-up Information    Adrian Gemma, MD. Call on 09/18/2018.   Specialty:  General Surgery Why:  for Monday 09/18/2018 @ 11:00. Please arrive 30 minutes prior to your appointment for paperwork.  Please bring a copy of your photo ID and insurance card.  Contact information: [address] [place] [address] [phone]        Monico Blitz, MD. Schedule an appointment as soon as possible for a visit in 5 day(s).   Specialty:  Internal Medicine Why:  for INR recheck. Contact information: [address]  [place] [address] [phone]        Minus Breeding, MD .   Specialty:  Cardiology Contact information: [address] [place] [address] [phone]          Allergies  Allergen Reactions  . Augmentin [Amoxicillin-Pot Clavulanate] Other (See Comments)    Patient said he had "heart problems" after taking the med and that it happened a long time ago and that he does not know if has had any PCNs or cephalosporins since  DID THE REACTION INVOLVE: Swelling of the face/tongue/throat, SOB, or low BP? Unknown Sudden or severe rash/hives, skin peeling, or the inside of the mouth or nose? Unknown Did it require medical treatment? Unknown When did it last happen?unknown If all above answers are "NO", may proceed with cephalosporin use.     Consultations:  Cardiology  General surgery  Procedures/Studies: Dg Chest Port 1 View  Result Date: 09/09/2018 CLINICAL DATA:  78 year old male with sepsis. EXAM: PORTABLE CHEST 1 VIEW COMPARISON:  Chest radiograph dated 12/18 7 FINDINGS: There is cardiomegaly with mild vascular congestion. No edema. No focal consolidation, pleural effusion, or pneumothorax. Median sternotomy wires and CABG vascular clips. No acute osseous pathology. IMPRESSION: 1. No acute cardiopulmonary process. 2. Cardiomegaly. Electronically Signed   By: Anner Crete M.D.   On: 09/09/2018 23:28    Subjective: Feels well, wants to go home. No fevers, bleeding, bruising or lightheadedness.   Discharge Exam: Vitals:   09/12/18 2110 09/13/18 0608  BP: (!) 120/59 (!) 126/57  Pulse: 63 (!) 104  Resp: 18 18  Temp:  98.4 F (36.9 C) 98 F (36.7 C)  SpO2: 97% 98%   General: Pt is alert, awake, not in acute distress Cardiovascular: RRR, S1/S2 +, no rubs, no gallops Respiratory: CTA bilaterally, no wheezing, no rhonchi Abdominal: Soft, NT, ND, bowel sounds + Skin: Transverse oblique incision in right inguinal crease with dressing c/d/i, no purulence, odor, or bleeding. Minimal surrounding erythema. Extremities: No edema, no cyanosis  Labs: BNP (last 3 results) No results for input(s): BNP in the  last 8760 hours. Basic Metabolic Panel: Recent Labs  Lab 09/09/18 2059 09/10/18 0407 09/11/18 0348 09/12/18 0748 09/13/18 0440  NA 135 133* 131* 134*  --   K 4.1 3.7 3.6 4.2  --   CL 104 105 105 109  --   CO2 23 19* 20* 19*  --   GLUCOSE 222* 160* 219* 153*  --   BUN 39* 39* 47* 47*  --   CREATININE 1.61* 1.38* 1.49* 1.24 1.44*  CALCIUM 8.5* 8.1* 8.0* 8.5*  --   MG  --  1.8  --   --   --   PHOS  --  2.3*  --   --   --    Liver Function Tests: Recent Labs  Lab 09/09/18 2059 09/10/18 0407  AST 38 31  ALT 29 26  ALKPHOS 128* 100  BILITOT 1.0 1.0  PROT 7.0 6.1*  ALBUMIN 3.0* 2.5*   No results for input(s): LIPASE, AMYLASE in the last 168 hours. No results for input(s): AMMONIA in the last 168 hours. CBC: Recent Labs  Lab 09/09/18 2059 09/10/18 0407 09/11/18 0348 09/13/18 0440  WBC 17.8* 18.2* 15.5* 7.8  NEUTROABS 14.6*  --   --   --   HGB 11.5* 10.2* 9.9* 10.2*  HCT 35.7* 31.3* 31.5* 32.5*  MCV 99.7 97.5 99.4 101.2*  PLT 261 225 257 284   Cardiac Enzymes: No results for input(s): CKTOTAL, CKMB, CKMBINDEX, TROPONINI in the last 168 hours. BNP: Invalid input(s): POCBNP CBG: Recent Labs  Lab 09/12/18 1950 09/13/18 0001 09/13/18 0406 09/13/18 0722 09/13/18 1136  GLUCAP 194* 215* 175* 167* 184*   D-Dimer No results for input(s): DDIMER in the last 72 hours. Hgb A1c No results for input(s): HGBA1C in the last 72 hours. Lipid Profile No results for input(s): CHOL, HDL,  LDLCALC, TRIG, CHOLHDL, LDLDIRECT in the last 72 hours. Thyroid function studies No results for input(s): TSH, T4TOTAL, T3FREE, THYROIDAB in the last 72 hours.  Invalid input(s): FREET3 Anemia work up No results for input(s): VITAMINB12, FOLATE, FERRITIN, TIBC, IRON, RETICCTPCT in the last 72 hours. Urinalysis No results found for: COLORURINE, APPEARANCEUR, LABSPEC, [place], GLUCOSEU, [place], [place], KETONESUR, PROTEINUR, UROBILINOGEN, NITRITE, [place]  Microbiology Recent Results (from the past 240 hour(s))  Culture, blood (Routine X 2) w Reflex to ID Panel     Status: None (Preliminary result)   Collection Time: 09/09/18 10:22 PM  Result Value Ref Range Status   Specimen Description   Final    BLOOD RIGHT ANTECUBITAL Performed at [facility] [address], [place], [place] [address]    Special Requests   Final    BOTTLES DRAWN AEROBIC AND ANAEROBIC Blood Culture adequate volume Performed at [facility] [address] [place], [place] [address]    Culture   Final    NO GROWTH 4 DAYS Performed at [facility], [place] [address], [place], [place] [address]    Report Status PENDING  Incomplete  Culture, blood (Routine X 2) w Reflex to ID Panel     Status: None (Preliminary result)   Collection Time: 09/09/18 10:37 PM  Result Value Ref Range Status   Specimen Description   Final    BLOOD RIGHT ANTECUBITAL Performed at [facility] [address] [place], [place] [address]    Special Requests   Final    BOTTLES DRAWN AEROBIC AND ANAEROBIC Blood Culture adequate volume Performed at [facility] [address] [place],  [place] [address]    Culture   Final    NO GROWTH 4 DAYS Performed at [facility], [place] [address] [place], [place] [address]    Report Status PENDING  Incomplete  MRSA PCR Screening     Status: None   Collection Time: 09/10/18 12:01 AM  Result Value Ref  Range Status   MRSA by PCR NEGATIVE NEGATIVE Final    Comment:        The GeneXpert MRSA Assay (FDA approved for NASAL specimens only), is one component of a comprehensive MRSA colonization surveillance program. It is not intended to diagnose MRSA infection nor to guide or monitor treatment for MRSA infections. Performed at [facility], [place] [address] [place], [place] [address]   Aerobic/Anaerobic Culture (surgical/deep wound)     Status: None (Preliminary result)   Collection Time: 09/11/18 10:00 AM  Result Value Ref Range Status   Specimen Description   Final    WOUND Performed at [facility] [address], [place], [place] [address]    Special Requests   Final    NONE Performed at [facility], [place] [address], [place], [place] [address]    Gram Stain   Final    MODERATE WBC PRESENT, PREDOMINANTLY PMN RARE GRAM POSITIVE COCCI Performed at [facility], [place] [address], [place], [place] [address]    Culture   Final    FEW GROUP B STREP(S.AGALACTIAE)ISOLATED TESTING AGAINST S. AGALACTIAE NOT ROUTINELY PERFORMED DUE TO PREDICTABILITY OF AMP/PEN/VAN SUSCEPTIBILITY. NO ANAEROBES ISOLATED; CULTURE IN PROGRESS FOR 5 DAYS    Report Status PENDING  Incomplete    Time coordinating discharge: Approximately 40 minutes  Patrecia Pour, MD  [facility] 09/14/2018, 6:08 PM Pager [phone]

## 2018-09-15 LAB — CULTURE, BLOOD (ROUTINE X 2)
Culture: NO GROWTH
Culture: NO GROWTH
SPECIAL REQUESTS: ADEQUATE
Special Requests: ADEQUATE

## 2018-09-16 LAB — AEROBIC/ANAEROBIC CULTURE W GRAM STAIN (SURGICAL/DEEP WOUND)

## 2018-09-18 DIAGNOSIS — T8149XA Infection following a procedure, other surgical site, initial encounter: Secondary | ICD-10-CM | POA: Diagnosis not present

## 2018-09-18 DIAGNOSIS — Z6827 Body mass index (BMI) 27.0-27.9, adult: Secondary | ICD-10-CM | POA: Diagnosis not present

## 2018-09-18 DIAGNOSIS — I10 Essential (primary) hypertension: Secondary | ICD-10-CM | POA: Diagnosis not present

## 2018-09-18 DIAGNOSIS — I5022 Chronic systolic (congestive) heart failure: Secondary | ICD-10-CM | POA: Diagnosis not present

## 2018-09-18 DIAGNOSIS — I4892 Unspecified atrial flutter: Secondary | ICD-10-CM | POA: Diagnosis not present

## 2018-09-18 DIAGNOSIS — Z299 Encounter for prophylactic measures, unspecified: Secondary | ICD-10-CM | POA: Diagnosis not present

## 2018-10-03 ENCOUNTER — Other Ambulatory Visit: Payer: Self-pay | Admitting: Cardiology

## 2018-10-13 ENCOUNTER — Telehealth: Payer: Self-pay | Admitting: Cardiology

## 2018-10-13 MED ORDER — SACUBITRIL-VALSARTAN 24-26 MG PO TABS: 1.0000 | ORAL_TABLET | Freq: Two times a day (BID) | ORAL | 0 refills | Status: DC

## 2018-10-13 NOTE — Telephone Encounter (Signed)
Called patient, spoke with wife- advised of plan.   Wife verbalized understanding.

## 2018-10-13 NOTE — Telephone Encounter (Signed)
Please advise spoke with wife, they lowered patient Entresto to 24/26- after procedure, only gave 30 days but now she is unsure of which dose patient should be on.  Can you please advise? And send in correct dose?   Thanks!

## 2018-10-13 NOTE — Addendum Note (Signed)
Addended by: Caprice Beaver T on: 10/13/2018 04:43 PM   Modules accepted: Orders

## 2018-10-13 NOTE — Telephone Encounter (Signed)
New Message    *STAT* If patient is at the pharmacy, call can be transferred to refill team.   1. Which medications need to be refilled? (please list name of each medication and dose if known) Entresto 24/26 mg  2. Which pharmacy/location (including street and city if local pharmacy) is medication to be sent to? [facility] in [place]  3. Do they need a 30 day or 90 day supply? 66 or 59   Pts wife is calling for refill. She says he was on a higher dose of Entresto and when the  Pt left the hospital, they sent him him with the 24/26 mg and she is now trying to refill them and she doesn't now if it needs to be the new dose or the old.  Please call

## 2018-10-13 NOTE — Telephone Encounter (Signed)
Sent in refill for 30 day; will route to Dr.Hochrein and nurse to give recommendations on appointment with him or APP.   Thanks!

## 2018-10-13 NOTE — Telephone Encounter (Signed)
Continue Entresto 24/26mg  dose for now. They changed it during hospital stay.    Will need to re-assess patient prior to any Entresto titration.   Should see Dr Percival Spanish or APP as recommended on hospital discharge instrcutions.

## 2018-10-16 NOTE — Telephone Encounter (Signed)
Pt schedule for virtual visit 04/07 @ 1pm

## 2018-10-16 NOTE — Telephone Encounter (Signed)
Nya,  He needs a virtual visit with me.

## 2018-10-17 ENCOUNTER — Encounter: Payer: Self-pay | Admitting: Cardiology

## 2018-10-17 ENCOUNTER — Other Ambulatory Visit: Payer: Self-pay

## 2018-10-17 ENCOUNTER — Telehealth (INDEPENDENT_AMBULATORY_CARE_PROVIDER_SITE_OTHER): Payer: Medicare Other | Admitting: Cardiology

## 2018-10-17 VITALS — BP 131/62 | HR 70 | Ht 70.0 in | Wt 175.0 lb

## 2018-10-17 DIAGNOSIS — I5022 Chronic systolic (congestive) heart failure: Secondary | ICD-10-CM

## 2018-10-17 DIAGNOSIS — I25810 Atherosclerosis of coronary artery bypass graft(s) without angina pectoris: Secondary | ICD-10-CM | POA: Diagnosis not present

## 2018-10-17 DIAGNOSIS — I4821 Permanent atrial fibrillation: Secondary | ICD-10-CM | POA: Insufficient documentation

## 2018-10-17 DIAGNOSIS — I10 Essential (primary) hypertension: Secondary | ICD-10-CM

## 2018-10-17 MED ORDER — SACUBITRIL-VALSARTAN 24-26 MG PO TABS: 1.0000 | ORAL_TABLET | Freq: Two times a day (BID) | ORAL | 3 refills | Status: AC

## 2018-10-17 NOTE — Progress Notes (Signed)
Virtual Visit via Video Note   This visit type was conducted due to national recommendations for restrictions regarding the COVID-19 Pandemic (e.g. social distancing) in an effort to limit this patient's exposure and mitigate transmission in our community.  Due to his co-morbid illnesses, this patient is at least at moderate risk for complications without adequate follow up.  This format is felt to be most appropriate for this patient at this time.  All issues noted in this document were discussed and addressed.  A limited physical exam was performed with this format.  Please refer to the patient's chart for his consent to telehealth for [facility].   Evaluation Performed:  Follow-up visit  Date:  10/17/2018   ID:  Adrian Barrett, DOB [birth year], MRN [id number]  Patient Location: Home  Provider Location: Office  PCP:  Monico Blitz, MD  Cardiologist:  Minus Breeding, MD  Electrophysiologist:  None   Chief Complaint:  Abdominal wound  History of Present Illness:    Adrian Barrett is a 78 y.o. male who presents via audio/video conferencing for a telehealth visit today.    The patient has a history of ASCAD s/p remote MI 54 and subsequent CABG and most recent cath 05/2017 showing occluded SVGs and patent LIMA to LAD s/p PCI of native OM and LCx. He has a known ischemic DCM with EF 35-40% on echo 08/2017. He also has chronic atrial fibrillation on chronic anticoagulation with warfarin. He was most recently in the hospital in Feb with sepsis following a hernia repair with cellulitis that followed this with a hematoma.  He had this drained and is packing the wound at home.    From a cardiac standpoint it sounds like he is doing pretty well.  He denies any cardiovascular symptoms.  He gets out in the yard and walks a little bit. The patient denies any new symptoms such as chest discomfort, neck or arm discomfort. There has been no new shortness of breath, PND or orthopnea. There have  been no reported palpitations, presyncope or syncope.  The patient does not have symptoms concerning for COVID-19 infection (fever, chills, cough, or new shortness of breath).    Past Medical History:  Diagnosis Date   Arthritis    "neck, lower back" (05/16/2017)   Atrial fibrillation (HCC)    Atrial flutter (HCC)    Congestive heart failure, unspecified    45 - 50%   COPD (chronic obstructive pulmonary disease) ([place])    Coronary atherosclerosis of native coronary artery    Gout    "on daily RX:" (05/16/2017)   History of ETOH abuse    Quit 20 years ago.    Hyperlipidemia    Mitral valve insufficiency    Mild to mod on echo 2015   Myocardial infarction [facility]) 1987   Obesity    Stroke ([place]) 01/2000   right posterior middle cerebral artery cerebrovascular accident/notes 51/11/2010; denies residual on 05/16/2017   Type II diabetes mellitus ([place])    Unspecified essential hypertension    Past Surgical History:  Procedure Laterality Date   [place]   "couldn't get thru"   CAROTID ENDARTERECTOMY Right 01/2000   WITH DACRON PATCH   CATARACT EXTRACTION W/ INTRAOCULAR LENS  IMPLANT, BILATERAL     CORONARY ANGIOPLASTY WITH STENT PLACEMENT  05/16/2017   CORONARY ARTERY BYPASS GRAFT  1999   CABG X 4   CORONARY STENT INTERVENTION N/A 05/16/2017   Procedure: CORONARY STENT INTERVENTION;  Surgeon: Glenetta Hew  W, MD;  Location: [place] [facility];  Service: Cardiovascular;  Laterality: N/A;   INCISION AND DRAINAGE ABSCESS ANAL  1987   INGUINAL HERNIA REPAIR Left    INGUINAL HERNIA REPAIR Right 07/25/2018   Procedure: RIGHT HERNIA REPAIR INGUINAL ADULT WITH MESH;  Surgeon: [name], MD;  Location: [facility];  Service: General;  Laterality: Right;   INSERTION OF MESH Right 07/25/2018   Procedure: INSERTION OF MESH;  Surgeon: [name], MD;  Location: [facility];  Service: General;  Laterality: Right;   RIGHT/LEFT HEART CATH AND CORONARY/GRAFT  ANGIOGRAPHY N/A 05/16/2017   Procedure: RIGHT/LEFT HEART CATH AND CORONARY/GRAFT ANGIOGRAPHY;  Surgeon: Leonie Man, MD;  Location: [place] [facility];  Service: Cardiovascular;  Laterality: N/A;     Current Meds  Medication Sig   albuterol (PROVENTIL HFA;VENTOLIN HFA) 108 (90 Base) MCG/ACT inhaler Inhale 2 puffs into the lungs every 6 (six) hours as needed for wheezing or shortness of breath.   allopurinol (ZYLOPRIM) 300 MG tablet Take 300 mg by mouth daily.     atorvastatin (LIPITOR) 40 MG tablet TAKE 1 TABLET BY MOUTH ONCE DAILY (Patient taking differently: Take 40 mg by mouth daily. )   clopidogrel (PLAVIX) 75 MG tablet Take 1 tablet (75 mg total) by mouth daily with breakfast.   furosemide (LASIX) 20 MG tablet Take 3 tablets (60 mg total) by mouth daily.   glyBURIDE (DIABETA) 5 MG tablet Take 5 mg by mouth at bedtime.   HYDROcodone-acetaminophen (NORCO/VICODIN) 5-325 MG tablet One tablet every four hours as needed for acute pain.  Limit of five days per [place]. (Patient taking differently: Take 1 tablet by mouth daily as needed for moderate pain. )   JARDIANCE 25 MG TABS tablet Take 25 mg by mouth at bedtime.    metFORMIN (GLUCOPHAGE) 500 MG tablet Take 500-1,000 mg by mouth See admin instructions. Pt takes 1000mg  AM and 500mg  PM   nitroGLYCERIN (NITROSTAT) 0.4 MG SL tablet Place 1 tablet (0.4 mg total) under the tongue every 5 (five) minutes x 3 doses as needed for chest pain. If no relief after the 3rd dose, proceed to the ED for an evaluation   Omega-3 Fatty Acids (FISH OIL) 1000 MG CAPS Take 1,000 mg by mouth daily.    sacubitril-valsartan (ENTRESTO) 24-26 MG Take 1 tablet by mouth 2 (two) times daily.   warfarin (COUMADIN) 5 MG tablet Take 5-7.5 mg by mouth See admin instructions. Takes 5 mg in the evening on Sun, take 7.5 mg in the evening Mon through Sat   [DISCONTINUED] Ibuprofen-diphenhydrAMINE HCl (IBUPROFEN PM) 200-25 MG CAPS Take 2 tablets by mouth at  bedtime as needed (sleep).     Allergies:   Augmentin [amoxicillin-pot clavulanate]   Social History   Tobacco Use   Smoking status: Former Smoker    Packs/day: 2.00    Years: 32.00    Pack years: 64.00    Types: Cigarettes    Last attempt to quit: 07/12/1985    Years since quitting: 33.2   Smokeless tobacco: Former Systems developer    Types: Chew    Quit date: 07/12/1986  Substance Use Topics   Alcohol use: No    Frequency: Never    Comment: [place] EtOH; "stopped in the 1990s"   Drug use: No     Family Hx: The patient's family history includes Heart attack in his father.  ROS:   Please see the history of present illness.    As stated in the HPI  and negative for all other systems.   Prior CV studies:   The following studies were reviewed today:  February hospital records  Labs/Other Tests and Data Reviewed:    EKG:  No ECG reviewed.  Recent Labs: 09/10/2018: ALT 26; Magnesium 1.8; TSH 1.609 09/12/2018: BUN 47; Potassium 4.2; Sodium 134 09/13/2018: Creatinine, Ser 1.44; Hemoglobin 10.2; Platelets 284   Recent Lipid Panel Lab Results  Component Value Date/Time   CHOL 124 02/03/2018 08:02 AM   TRIG 121 02/03/2018 08:02 AM   HDL 48 02/03/2018 08:02 AM   LDLCALC 52 02/03/2018 08:02 AM    Wt Readings from Last 3 Encounters:  10/17/18 175 lb (79.4 kg)  09/09/18 180 lb 12.4 oz (82 kg)  07/25/18 182 lb (82.6 kg)     Objective:    Vital Signs:  BP 131/62    Pulse 70    Ht 5\' 10"  (1.778 m)    Wt 175 lb (79.4 kg)    BMI 25.11 kg/m    Well nourished, well developed male in no acute distress. Lower abdominal wound with packing.  No drainage or erythema.  ASSESSMENT & PLAN:    CHRONIC SYSTOLIC HF - He is euvolemic.  Talking to him today.  He will continue the meds as listed.  I will consider med titration when I see him again.  ATRIAL FIBRILLATION -  Adrian Barrett has a CHA2DS2 - VASc score of 4.   He tolerates anticoagulation.  He does not want to get his INR  checked and it is been very steady and was last checked at the beginning of March.  I told him it would be fine to wait till the beginning of May.  If he would prefer we can have him drive down to [facility] for a drive by INR as we are doing them without patients leaving the car.  He will let me know.  CAD -  The patient has no new sypmtoms.  No further cardiovascular testing is indicated.  We will continue with aggressive risk reduction and meds as listed.  Because of his coronary disease and extensive stenosis and going to continue the Plavix.  Carotid stenosis -  This is followed by Dr. Manuella Ghazi.  HTN (hypertension) -  The blood pressure is controlled as above.  DM - This is followed by  Monico Blitz, MD.  He does not know what he is A1c is.    DYSLIPIDEMIA - I would like to try to get his A1C from Monico Blitz, MD  COVID-19 Education: The signs and symptoms of COVID-19 were discussed with the patient and how to seek care for testing (follow up with PCP or arrange E-visit).  The importance of social distancing was discussed today.  Time:   Today, I have spent 20 minutes with the patient with telehealth technology discussing the above problems.     Medication Adjustments/Labs and Tests Ordered: Current medicines are reviewed at length with the patient today.  Concerns regarding medicines are outlined above.  Tests Ordered: No orders of the defined types were placed in this encounter.  Medication Changes: No orders of the defined types were placed in this encounter.   Disposition:  Follow up six months  Signed, Minus Breeding, MD  10/17/2018 1:20 PM    [facility]

## 2018-10-17 NOTE — Patient Instructions (Signed)

## 2018-11-17 DIAGNOSIS — Z299 Encounter for prophylactic measures, unspecified: Secondary | ICD-10-CM | POA: Diagnosis not present

## 2018-11-17 DIAGNOSIS — I5022 Chronic systolic (congestive) heart failure: Secondary | ICD-10-CM | POA: Diagnosis not present

## 2018-11-17 DIAGNOSIS — J449 Chronic obstructive pulmonary disease, unspecified: Secondary | ICD-10-CM | POA: Diagnosis not present

## 2018-11-17 DIAGNOSIS — Z6827 Body mass index (BMI) 27.0-27.9, adult: Secondary | ICD-10-CM | POA: Diagnosis not present

## 2018-11-17 DIAGNOSIS — I4891 Unspecified atrial fibrillation: Secondary | ICD-10-CM | POA: Diagnosis not present

## 2018-12-05 ENCOUNTER — Other Ambulatory Visit: Payer: Self-pay | Admitting: Cardiology

## 2018-12-19 DIAGNOSIS — I10 Essential (primary) hypertension: Secondary | ICD-10-CM | POA: Diagnosis not present

## 2018-12-19 DIAGNOSIS — E1165 Type 2 diabetes mellitus with hyperglycemia: Secondary | ICD-10-CM | POA: Diagnosis not present

## 2018-12-19 DIAGNOSIS — Z299 Encounter for prophylactic measures, unspecified: Secondary | ICD-10-CM | POA: Diagnosis not present

## 2018-12-19 DIAGNOSIS — I4892 Unspecified atrial flutter: Secondary | ICD-10-CM | POA: Diagnosis not present

## 2018-12-19 DIAGNOSIS — I509 Heart failure, unspecified: Secondary | ICD-10-CM | POA: Diagnosis not present

## 2018-12-19 DIAGNOSIS — Z6827 Body mass index (BMI) 27.0-27.9, adult: Secondary | ICD-10-CM | POA: Diagnosis not present

## 2019-01-09 DIAGNOSIS — Z299 Encounter for prophylactic measures, unspecified: Secondary | ICD-10-CM | POA: Diagnosis not present

## 2019-01-09 DIAGNOSIS — I4891 Unspecified atrial fibrillation: Secondary | ICD-10-CM | POA: Diagnosis not present

## 2019-01-09 DIAGNOSIS — I509 Heart failure, unspecified: Secondary | ICD-10-CM | POA: Diagnosis not present

## 2019-01-09 DIAGNOSIS — I10 Essential (primary) hypertension: Secondary | ICD-10-CM | POA: Diagnosis not present

## 2019-01-09 DIAGNOSIS — I4892 Unspecified atrial flutter: Secondary | ICD-10-CM | POA: Diagnosis not present

## 2019-01-09 DIAGNOSIS — Z6827 Body mass index (BMI) 27.0-27.9, adult: Secondary | ICD-10-CM | POA: Diagnosis not present

## 2019-01-17 DIAGNOSIS — H4322 Crystalline deposits in vitreous body, left eye: Secondary | ICD-10-CM | POA: Diagnosis not present

## 2019-01-17 DIAGNOSIS — Z961 Presence of intraocular lens: Secondary | ICD-10-CM | POA: Diagnosis not present

## 2019-01-17 DIAGNOSIS — H31093 Other chorioretinal scars, bilateral: Secondary | ICD-10-CM | POA: Diagnosis not present

## 2019-01-17 DIAGNOSIS — E119 Type 2 diabetes mellitus without complications: Secondary | ICD-10-CM | POA: Diagnosis not present

## 2019-01-17 DIAGNOSIS — H04123 Dry eye syndrome of bilateral lacrimal glands: Secondary | ICD-10-CM | POA: Diagnosis not present

## 2019-01-17 DIAGNOSIS — H35372 Puckering of macula, left eye: Secondary | ICD-10-CM | POA: Diagnosis not present

## 2019-02-08 DIAGNOSIS — Z6827 Body mass index (BMI) 27.0-27.9, adult: Secondary | ICD-10-CM | POA: Diagnosis not present

## 2019-02-08 DIAGNOSIS — E1165 Type 2 diabetes mellitus with hyperglycemia: Secondary | ICD-10-CM | POA: Diagnosis not present

## 2019-02-08 DIAGNOSIS — I10 Essential (primary) hypertension: Secondary | ICD-10-CM | POA: Diagnosis not present

## 2019-02-08 DIAGNOSIS — I5022 Chronic systolic (congestive) heart failure: Secondary | ICD-10-CM | POA: Diagnosis not present

## 2019-02-08 DIAGNOSIS — I4891 Unspecified atrial fibrillation: Secondary | ICD-10-CM | POA: Diagnosis not present

## 2019-02-08 DIAGNOSIS — Z299 Encounter for prophylactic measures, unspecified: Secondary | ICD-10-CM | POA: Diagnosis not present

## 2019-02-13 DIAGNOSIS — E1159 Type 2 diabetes mellitus with other circulatory complications: Secondary | ICD-10-CM | POA: Diagnosis not present

## 2019-02-13 DIAGNOSIS — E1140 Type 2 diabetes mellitus with diabetic neuropathy, unspecified: Secondary | ICD-10-CM | POA: Diagnosis not present

## 2019-03-08 ENCOUNTER — Other Ambulatory Visit: Payer: Self-pay | Admitting: Cardiology

## 2019-03-09 DIAGNOSIS — I10 Essential (primary) hypertension: Secondary | ICD-10-CM | POA: Diagnosis not present

## 2019-03-09 DIAGNOSIS — Z299 Encounter for prophylactic measures, unspecified: Secondary | ICD-10-CM | POA: Diagnosis not present

## 2019-03-09 DIAGNOSIS — I739 Peripheral vascular disease, unspecified: Secondary | ICD-10-CM | POA: Diagnosis not present

## 2019-03-09 DIAGNOSIS — Z6827 Body mass index (BMI) 27.0-27.9, adult: Secondary | ICD-10-CM | POA: Diagnosis not present

## 2019-03-09 DIAGNOSIS — E1165 Type 2 diabetes mellitus with hyperglycemia: Secondary | ICD-10-CM | POA: Diagnosis not present

## 2019-03-09 DIAGNOSIS — I4891 Unspecified atrial fibrillation: Secondary | ICD-10-CM | POA: Diagnosis not present

## 2019-03-09 DIAGNOSIS — I4892 Unspecified atrial flutter: Secondary | ICD-10-CM | POA: Diagnosis not present

## 2019-03-14 DIAGNOSIS — H35372 Puckering of macula, left eye: Secondary | ICD-10-CM | POA: Diagnosis not present

## 2019-03-14 DIAGNOSIS — H4322 Crystalline deposits in vitreous body, left eye: Secondary | ICD-10-CM | POA: Diagnosis not present

## 2019-03-14 DIAGNOSIS — H35351 Cystoid macular degeneration, right eye: Secondary | ICD-10-CM | POA: Diagnosis not present

## 2019-03-14 DIAGNOSIS — Z961 Presence of intraocular lens: Secondary | ICD-10-CM | POA: Diagnosis not present

## 2019-03-14 DIAGNOSIS — E119 Type 2 diabetes mellitus without complications: Secondary | ICD-10-CM | POA: Diagnosis not present

## 2019-03-14 DIAGNOSIS — H04123 Dry eye syndrome of bilateral lacrimal glands: Secondary | ICD-10-CM | POA: Diagnosis not present

## 2019-03-14 DIAGNOSIS — H31093 Other chorioretinal scars, bilateral: Secondary | ICD-10-CM | POA: Diagnosis not present

## 2019-03-15 ENCOUNTER — Other Ambulatory Visit: Payer: Self-pay | Admitting: Cardiology

## 2019-03-20 ENCOUNTER — Encounter (INDEPENDENT_AMBULATORY_CARE_PROVIDER_SITE_OTHER): Payer: Medicare Other | Admitting: Ophthalmology

## 2019-03-20 ENCOUNTER — Other Ambulatory Visit: Payer: Self-pay

## 2019-03-20 DIAGNOSIS — H43813 Vitreous degeneration, bilateral: Secondary | ICD-10-CM | POA: Diagnosis not present

## 2019-03-20 DIAGNOSIS — H353132 Nonexudative age-related macular degeneration, bilateral, intermediate dry stage: Secondary | ICD-10-CM | POA: Diagnosis not present

## 2019-03-20 DIAGNOSIS — I10 Essential (primary) hypertension: Secondary | ICD-10-CM | POA: Diagnosis not present

## 2019-03-20 DIAGNOSIS — H35033 Hypertensive retinopathy, bilateral: Secondary | ICD-10-CM | POA: Diagnosis not present

## 2019-03-20 DIAGNOSIS — H59031 Cystoid macular edema following cataract surgery, right eye: Secondary | ICD-10-CM | POA: Diagnosis not present

## 2019-04-02 DIAGNOSIS — I70213 Atherosclerosis of native arteries of extremities with intermittent claudication, bilateral legs: Secondary | ICD-10-CM | POA: Diagnosis not present

## 2019-04-02 DIAGNOSIS — I739 Peripheral vascular disease, unspecified: Secondary | ICD-10-CM | POA: Diagnosis not present

## 2019-04-10 DIAGNOSIS — J449 Chronic obstructive pulmonary disease, unspecified: Secondary | ICD-10-CM | POA: Diagnosis not present

## 2019-04-10 DIAGNOSIS — Z299 Encounter for prophylactic measures, unspecified: Secondary | ICD-10-CM | POA: Diagnosis not present

## 2019-04-10 DIAGNOSIS — Z23 Encounter for immunization: Secondary | ICD-10-CM | POA: Diagnosis not present

## 2019-04-10 DIAGNOSIS — I739 Peripheral vascular disease, unspecified: Secondary | ICD-10-CM | POA: Diagnosis not present

## 2019-04-10 DIAGNOSIS — I4891 Unspecified atrial fibrillation: Secondary | ICD-10-CM | POA: Diagnosis not present

## 2019-04-10 DIAGNOSIS — I509 Heart failure, unspecified: Secondary | ICD-10-CM | POA: Diagnosis not present

## 2019-04-12 ENCOUNTER — Telehealth: Payer: Self-pay | Admitting: Cardiology

## 2019-04-12 NOTE — Telephone Encounter (Signed)
LVM for patient to call and schedule appt for 6 month followup with Dr. Percival Spanish.

## 2019-05-01 ENCOUNTER — Encounter (INDEPENDENT_AMBULATORY_CARE_PROVIDER_SITE_OTHER): Payer: Medicare Other | Admitting: Ophthalmology

## 2019-05-01 ENCOUNTER — Other Ambulatory Visit: Payer: Self-pay

## 2019-05-01 DIAGNOSIS — H43813 Vitreous degeneration, bilateral: Secondary | ICD-10-CM | POA: Diagnosis not present

## 2019-05-01 DIAGNOSIS — H35372 Puckering of macula, left eye: Secondary | ICD-10-CM | POA: Diagnosis not present

## 2019-05-01 DIAGNOSIS — I10 Essential (primary) hypertension: Secondary | ICD-10-CM | POA: Diagnosis not present

## 2019-05-01 DIAGNOSIS — H35033 Hypertensive retinopathy, bilateral: Secondary | ICD-10-CM

## 2019-05-01 DIAGNOSIS — H353132 Nonexudative age-related macular degeneration, bilateral, intermediate dry stage: Secondary | ICD-10-CM

## 2019-05-01 DIAGNOSIS — H59031 Cystoid macular edema following cataract surgery, right eye: Secondary | ICD-10-CM | POA: Diagnosis not present

## 2019-05-10 DIAGNOSIS — I4892 Unspecified atrial flutter: Secondary | ICD-10-CM | POA: Diagnosis not present

## 2019-05-10 DIAGNOSIS — I10 Essential (primary) hypertension: Secondary | ICD-10-CM | POA: Diagnosis not present

## 2019-05-10 DIAGNOSIS — Z299 Encounter for prophylactic measures, unspecified: Secondary | ICD-10-CM | POA: Diagnosis not present

## 2019-05-10 DIAGNOSIS — Z6827 Body mass index (BMI) 27.0-27.9, adult: Secondary | ICD-10-CM | POA: Diagnosis not present

## 2019-05-10 DIAGNOSIS — I5022 Chronic systolic (congestive) heart failure: Secondary | ICD-10-CM | POA: Diagnosis not present

## 2019-05-10 DIAGNOSIS — E1165 Type 2 diabetes mellitus with hyperglycemia: Secondary | ICD-10-CM | POA: Diagnosis not present

## 2019-05-13 ENCOUNTER — Other Ambulatory Visit: Payer: Self-pay | Admitting: Cardiology

## 2019-06-04 ENCOUNTER — Other Ambulatory Visit: Payer: Self-pay | Admitting: Cardiology

## 2019-06-11 DIAGNOSIS — Z7189 Other specified counseling: Secondary | ICD-10-CM | POA: Insufficient documentation

## 2019-06-11 DIAGNOSIS — E785 Hyperlipidemia, unspecified: Secondary | ICD-10-CM | POA: Insufficient documentation

## 2019-06-11 NOTE — Progress Notes (Signed)
Cardiology Office Note   Date:  06/13/2019   ID:  Adrian Barrett, DOB 1940/10/02, MRN [id number]  PCP:  Monico Blitz, MD  Cardiologist:   Minus Breeding, MD   No chief complaint on file.     History of Present Illness: Adrian Barrett is a 78 y.o. male who presents for evaluation of ASCAD s/p remote MI 1987 and subsequent CABG and most recent cath 05/2017 showing occluded SVGs and patent LIMA to LAD s/p PCI of native OM and LCx. He has a known ischemic DCM with EF 35-40% on echo 08/2017. He also has chronic atrial fibrillation on chronic anticoagulation with warfarin. He was most recently in the hospital in Feb with sepsis following a hernia repair with cellulitis that followed this with a hematoma.  He had this drained and was packing the wound at home.    Since I last saw him he is finally had healing of that wound.  He is got along fairly well.  He rides his exercise bike.  He does yard work.  He is not describing any new shortness of breath, PND or orthopnea.  He said no new chest pressure, neck or arm discomfort.  He has had no weight gain or edema.  He does not really notice his atrial fibrillation.   Past Medical History:  Diagnosis Date  . Arthritis    "neck, lower back" (05/16/2017)  . Atrial fibrillation ([place])   . Atrial flutter ([place])   . Congestive heart failure, unspecified    45 - 50%  . COPD (chronic obstructive pulmonary disease) ([place])   . Coronary atherosclerosis of native coronary artery   . Gout    "on daily RX:" (05/16/2017)  . History of ETOH abuse    Quit 20 years ago.   Marland Kitchen Hyperlipidemia   . Mitral valve insufficiency    Mild to mod on echo 2015  . Myocardial infarction ([place]) 1987  . Obesity   . Stroke [facility]) 01/2000   right posterior middle cerebral artery cerebrovascular accident/notes 51/11/2010; denies residual on 05/16/2017  . Type II diabetes mellitus ([place])   . Unspecified essential hypertension     Past Surgical History:  Procedure  Laterality Date  . CARDIAC CATHETERIZATION  1987   "couldn't get thru"  . CAROTID ENDARTERECTOMY Right 01/2000   WITH DACRON PATCH  . CATARACT EXTRACTION W/ INTRAOCULAR LENS  IMPLANT, BILATERAL    . CORONARY ANGIOPLASTY WITH STENT PLACEMENT  05/16/2017  . CORONARY ARTERY BYPASS GRAFT  1999   CABG X 4  . CORONARY STENT INTERVENTION N/A 05/16/2017   Procedure: CORONARY STENT INTERVENTION;  Surgeon: Leonie Man, MD;  Location: [place] [facility];  Service: Cardiovascular;  Laterality: N/A;  . INCISION AND DRAINAGE ABSCESS ANAL  1987  . INGUINAL HERNIA REPAIR Left   . INGUINAL HERNIA REPAIR Right 07/25/2018   Procedure: RIGHT HERNIA REPAIR INGUINAL ADULT WITH MESH;  Surgeon: [name], MD;  [facility];  Service: General;  Laterality: Right;  . INSERTION OF MESH Right 07/25/2018   Procedure: INSERTION OF MESH;  Surgeon: [name], MD;  [facility];  Service: General;  Laterality: Right;  . RIGHT/LEFT HEART CATH AND CORONARY/GRAFT ANGIOGRAPHY N/A 05/16/2017   Procedure: RIGHT/LEFT HEART CATH AND CORONARY/GRAFT ANGIOGRAPHY;  Surgeon: Leonie Man, MD;  Location: [place] [facility];  Service: Cardiovascular;  Laterality: N/A;     Current Outpatient Medications  Medication Sig Dispense Refill  . albuterol (PROVENTIL HFA;VENTOLIN HFA) 108 (90  Base) MCG/ACT inhaler Inhale 2 puffs into the lungs every 6 (six) hours as needed for wheezing or shortness of breath.    . allopurinol (ZYLOPRIM) 300 MG tablet Take 300 mg by mouth daily.      Marland Kitchen atorvastatin (LIPITOR) 40 MG tablet Take 1 tablet by mouth once daily 90 tablet 0  . furosemide (LASIX) 20 MG tablet Take 3 tablets by mouth once daily 270 tablet 0  . glyBURIDE (DIABETA) 5 MG tablet Take 5 mg by mouth at bedtime.    Marland Kitchen HYDROcodone-acetaminophen (NORCO/VICODIN) 5-325 MG tablet One tablet every four hours as needed for acute pain.  Limit of five days per  statue. 30 tablet 0  . JARDIANCE 25 MG TABS tablet Take 25 mg by  mouth at bedtime.   2  . metFORMIN (GLUCOPHAGE) 500 MG tablet Take 500-1,000 mg by mouth See admin instructions. Pt takes 1000mg  AM and 500mg  PM    . Omega-3 Fatty Acids (FISH OIL) 1000 MG CAPS Take 1,000 mg by mouth daily.     Marland Kitchen PROLENSA 0.07 % SOLN Place 1 drop into the right eye at bedtime.    . sacubitril-valsartan (ENTRESTO) 24-26 MG Take 1 tablet by mouth 2 (two) times daily. 180 tablet 3  . warfarin (COUMADIN) 5 MG tablet Take 5-7.5 mg by mouth See admin instructions. Takes 5 mg in the evening on Sun, take 7.5 mg in the evening Mon through Sat    . clopidogrel (PLAVIX) 75 MG tablet Take 1 tablet (75 mg total) by mouth daily. 90 tablet 3  . nitroGLYCERIN (NITROSTAT) 0.4 MG SL tablet Place 1 tablet (0.4 mg total) under the tongue every 5 (five) minutes x 3 doses as needed for chest pain. If no relief after the 3rd dose, proceed to the ED for an evaluation 25 tablet 3   No current facility-administered medications for this visit.     Allergies:   Augmentin [amoxicillin-pot clavulanate]    ROS:  Please see the history of present illness.   Otherwise, review of systems are positive for none.   All other systems are reviewed and negative.    PHYSICAL EXAM: VS:  BP (!) 142/60   Pulse (!) 51   Ht 5\' 10"  (1.778 m)   Wt 181 lb (82.1 kg)   BMI 25.97 kg/m  , BMI Body mass index is 25.97 kg/m. GENERAL:  Well appearing NECK:  No jugular venous distention, waveform within normal limits, carotid upstroke brisk and symmetric, no bruits, no thyromegaly LUNGS:  Clear to auscultation bilaterally CHEST:  Well healed sternotomy scar. HEART:  PMI not displaced or sustained,S1 and S2 within normal limits, no S3, no clicks, no rubs, no murmurs, irregular  ABD:  Flat, positive bowel sounds normal in frequency in pitch, no bruits, no rebound, no guarding, no midline pulsatile mass, no hepatomegaly, no splenomegaly EXT:  2 plus pulses throughout, no edema, no cyanosis no clubbing   EKG:  EKG is ordered  today. The ekg ordered today demonstrates atrial fibrillation, rate 51, left axis deviation, interventricular conduction delay, left anterior fascicular block, no acute ST-T wave changes.   Recent Labs: 09/10/2018: ALT 26; Magnesium 1.8; TSH 1.609 09/12/2018: BUN 47; Potassium 4.2; Sodium 134 09/13/2018: Creatinine, Ser 1.44; Hemoglobin 10.2; Platelets 284    Lipid Panel    Component Value Date/Time   CHOL 124 02/03/2018 0802   TRIG 121 02/03/2018 0802   HDL 48 02/03/2018 0802   LDLCALC 52 02/03/2018 0802  Wt Readings from Last 3 Encounters:  06/13/19 181 lb (82.1 kg)  10/17/18 175 lb (79.4 kg)  09/09/18 180 lb 12.4 oz (82 kg)      Other studies Reviewed: Additional studies/ records that were reviewed today include: None. Review of the above records demonstrates:  Please see elsewhere in the note.     ASSESSMENT AND PLAN:  Atrial fib - Mr.Adrian L Tugglehas a CHA2DS2 - VASc score of 4.  He tolerates anticoagulation.  CAD - The patient has no new symptoms.  No change in therapy.   Chronic HF: He seems to be euvolemic.  He tolerates the meds as listed.  No change in therapy.  HTN (hypertension) -  The blood pressure is elevated but this is very unusual for him.  He takes it at home and we have not even been able to do med titration because of the lower pressures.  No change in therapy.  He will keep an eye on it at home and if I get a chance I might be able to titrate his Entresto.   DM- This is followed by Monico Blitz, MD   DYSLIPIDEMIA - Last LDL was 52 with an HDL of 46 followed again by Monico Blitz, MD   COVID-19 Education: We discussed the vaccine and other concerns.    Current medicines are reviewed at length with the patient today.  The patient does not have concerns regarding medicines.  The following changes have been made:  no change  Labs/ tests ordered today include: None  Orders Placed This Encounter  Procedures  . EKG 12-Lead      Disposition:   FU with me in six months.     Signed, Minus Breeding, MD  06/13/2019 11:13 AM    [facility]

## 2019-06-12 ENCOUNTER — Encounter (INDEPENDENT_AMBULATORY_CARE_PROVIDER_SITE_OTHER): Payer: Medicare Other | Admitting: Ophthalmology

## 2019-06-12 DIAGNOSIS — H35372 Puckering of macula, left eye: Secondary | ICD-10-CM | POA: Diagnosis not present

## 2019-06-12 DIAGNOSIS — Z299 Encounter for prophylactic measures, unspecified: Secondary | ICD-10-CM | POA: Diagnosis not present

## 2019-06-12 DIAGNOSIS — Z6827 Body mass index (BMI) 27.0-27.9, adult: Secondary | ICD-10-CM | POA: Diagnosis not present

## 2019-06-12 DIAGNOSIS — H35033 Hypertensive retinopathy, bilateral: Secondary | ICD-10-CM | POA: Diagnosis not present

## 2019-06-12 DIAGNOSIS — H43813 Vitreous degeneration, bilateral: Secondary | ICD-10-CM | POA: Diagnosis not present

## 2019-06-12 DIAGNOSIS — H59031 Cystoid macular edema following cataract surgery, right eye: Secondary | ICD-10-CM

## 2019-06-12 DIAGNOSIS — I10 Essential (primary) hypertension: Secondary | ICD-10-CM

## 2019-06-12 DIAGNOSIS — H353132 Nonexudative age-related macular degeneration, bilateral, intermediate dry stage: Secondary | ICD-10-CM | POA: Diagnosis not present

## 2019-06-12 DIAGNOSIS — I4892 Unspecified atrial flutter: Secondary | ICD-10-CM | POA: Diagnosis not present

## 2019-06-12 DIAGNOSIS — I509 Heart failure, unspecified: Secondary | ICD-10-CM | POA: Diagnosis not present

## 2019-06-12 DIAGNOSIS — M549 Dorsalgia, unspecified: Secondary | ICD-10-CM | POA: Diagnosis not present

## 2019-06-13 ENCOUNTER — Encounter: Payer: Self-pay | Admitting: Cardiology

## 2019-06-13 ENCOUNTER — Other Ambulatory Visit: Payer: Self-pay

## 2019-06-13 ENCOUNTER — Ambulatory Visit (INDEPENDENT_AMBULATORY_CARE_PROVIDER_SITE_OTHER): Payer: Medicare Other | Admitting: Cardiology

## 2019-06-13 VITALS — BP 142/60 | HR 51 | Ht 70.0 in | Wt 181.0 lb

## 2019-06-13 DIAGNOSIS — I25810 Atherosclerosis of coronary artery bypass graft(s) without angina pectoris: Secondary | ICD-10-CM

## 2019-06-13 DIAGNOSIS — I10 Essential (primary) hypertension: Secondary | ICD-10-CM | POA: Diagnosis not present

## 2019-06-13 DIAGNOSIS — Z7189 Other specified counseling: Secondary | ICD-10-CM

## 2019-06-13 DIAGNOSIS — E785 Hyperlipidemia, unspecified: Secondary | ICD-10-CM

## 2019-06-13 DIAGNOSIS — E1169 Type 2 diabetes mellitus with other specified complication: Secondary | ICD-10-CM

## 2019-06-13 DIAGNOSIS — I482 Chronic atrial fibrillation, unspecified: Secondary | ICD-10-CM

## 2019-06-13 MED ORDER — CLOPIDOGREL BISULFATE 75 MG PO TABS: 75.0000 mg | ORAL_TABLET | Freq: Every day | ORAL | 3 refills | Status: AC

## 2019-06-13 NOTE — Patient Instructions (Signed)
Medication Instructions:  The current medical regimen is effective;  continue present plan and medications.  *If you need a refill on your cardiac medications before your next appointment, please call your pharmacy*  Follow-Up: At [facility], you and your health needs are our priority.  As part of our continuing mission to provide you with exceptional heart care, we have created designated Provider Care Teams.  These Care Teams include your primary Cardiologist (physician) and Advanced Practice Providers (APPs -  Physician Assistants and Nurse Practitioners) who all work together to provide you with the care you need, when you need it.  Your next appointment:   6 month(s)  The format for your next appointment:   In Person  Provider:   James Hochrein, MD  Thank you for choosing [facility]!!     

## 2019-07-13 ENCOUNTER — Ambulatory Visit
Admission: EM | Admit: 2019-07-13 | Discharge: 2019-07-13 | Disposition: A | Discharge status: Home / Self Care | Payer: Medicare Other | Attending: Emergency Medicine | Admitting: Emergency Medicine

## 2019-07-13 ENCOUNTER — Ambulatory Visit (INDEPENDENT_AMBULATORY_CARE_PROVIDER_SITE_OTHER): Payer: Medicare Other

## 2019-07-13 ENCOUNTER — Other Ambulatory Visit: Payer: Self-pay

## 2019-07-13 DIAGNOSIS — R197 Diarrhea, unspecified: Secondary | ICD-10-CM | POA: Diagnosis not present

## 2019-07-13 DIAGNOSIS — I517 Cardiomegaly: Secondary | ICD-10-CM

## 2019-07-13 DIAGNOSIS — E1165 Type 2 diabetes mellitus with hyperglycemia: Secondary | ICD-10-CM

## 2019-07-13 DIAGNOSIS — R05 Cough: Secondary | ICD-10-CM | POA: Diagnosis not present

## 2019-07-13 DIAGNOSIS — R5383 Other fatigue: Secondary | ICD-10-CM | POA: Diagnosis not present

## 2019-07-13 DIAGNOSIS — Z20822 Contact with and (suspected) exposure to covid-19: Secondary | ICD-10-CM

## 2019-07-13 LAB — POCT FASTING CBG KUC MANUAL ENTRY: POCT Glucose (KUC): 192 mg/dL — AB (ref 70–99)

## 2019-07-13 MED ORDER — ALBUTEROL SULFATE HFA 108 (90 BASE) MCG/ACT IN AERS: 1.0000 | INHALATION_SPRAY | Freq: Four times a day (QID) | RESPIRATORY_TRACT | 0 refills | Status: AC | PRN

## 2019-07-13 MED ORDER — BENZONATATE 100 MG PO CAPS: 100.0000 mg | ORAL_CAPSULE | Freq: Three times a day (TID) | ORAL | 0 refills | Status: AC

## 2019-07-13 NOTE — Discharge Instructions (Addendum)
Chest x-ray show cardiomegaly Advised patient to follow-up with primary care for possible cardiology referral COVID testing ordered.  It will take between 2-7 days for test results.  Someone will contact you regarding abnormal results.    In the meantime: You should remain isolated in your home for 10 days from symptom onset  Get plenty of rest and push fluids Tessalon Perles prescribed for cough Pro Air prescribed for nasal congestion and runny nose Use medications daily for symptom relief Use OTC medications like ibuprofen or tylenol as needed fever or pain Call or go to the ED if you have any new or worsening symptoms such as fever, worsening cough, shortness of breath, chest tightness, chest pain, turning blue, changes in mental status, etc..Marland Kitchen

## 2019-07-13 NOTE — ED Triage Notes (Signed)
Pt presents to UC w/ feeling weak, cough, diarrhea x1 week . Denies fevers.

## 2019-07-13 NOTE — ED Provider Notes (Signed)
[facility]    CSN: [id number] Arrival date & time: 07/13/19  1605      History   Chief Complaint Chief Complaint  Patient presents with  . weakness, cough  . Appointment    1600    HPI Adrian Barrett is a 79 y.o. male.   Adrian Barrett 79 years old male presented to the urgent care with a complaint of cough, fatigue, diarrhea for the past 1 week.  Denies sick exposure to COVID, flu or strep.  Denies recent travel.  Denies aggravating or alleviating symptoms.  Denies previous COVID infection.   Denies fever, chills,  nasal congestion, rhinorrhea, sore throat, , SOB, wheezing, chest pain, nausea, vomiting, changes in bowel or bladder habits.       Past Medical History:  Diagnosis Date  . Arthritis    "neck, lower back" (05/16/2017)  . Atrial fibrillation ([place])   . Atrial flutter ([place])   . Congestive heart failure, unspecified    45 - 50%  . COPD (chronic obstructive pulmonary disease) ([place])   . Coronary atherosclerosis of native coronary artery   . Gout    "on daily RX:" (05/16/2017)  . History of ETOH abuse    Quit 20 years ago.   Marland Kitchen Hyperlipidemia   . Mitral valve insufficiency    Mild to mod on echo 2015  . Myocardial infarction ([place]) 1987  . Obesity   . Stroke [facility]) 01/2000   right posterior middle cerebral artery cerebrovascular accident/notes 51/11/2010; denies residual on 05/16/2017  . Type II diabetes mellitus ([place])   . Unspecified essential hypertension     Patient Active Problem List   Diagnosis Date Noted  . Dyslipidemia 06/11/2019  . Educated about COVID-19 virus infection 06/11/2019  . Permanent atrial fibrillation ([place]) 10/17/2018  . Coronary artery disease involving coronary bypass graft of native heart without angina pectoris 10/17/2018  . Fluid collection at surgical site   . Cellulitis 09/09/2018  . Hematoma 09/09/2018  . Sepsis ([place]) 09/09/2018  . Right inguinal hernia status post open repair with mesh 07/24/2018 07/24/2018  .  Chronic systolic HF (heart failure) ([place]) 02/01/2018  . Medication management 02/01/2018  . CAD S/P percutaneous coronary angioplasty 05/27/2017  . Chronic anticoagulation 05/27/2017  . Ischemic cardiomyopathy 05/16/2017  . Coronary artery disease involving native coronary artery of native heart with unstable angina pectoris ([place]) 05/16/2017  . Acute combined systolic and diastolic heart failure ([place]) 04/27/2017  . SOB (shortness of breath) 04/22/2017  . Carotid stenosis 12/14/2011  . Fatigue 12/14/2011  . Essential hypertension 12/14/2011  . Obesity, unspecified 09/25/2009  . Non-insulin dependent type 2 diabetes mellitus ([place]) 09/24/2009  . Type 2 diabetes mellitus with hyperlipidemia ([place]) 09/24/2009  . NONDEPENDENT ALCOHOL ABUSE UNSPEC DRUNKENNESS 09/24/2009  . MITRAL VALVE INSUFF&AORTIC VALVE INSUFF 09/24/2009  . DISEASES OF TRICUSPID VALVE 09/24/2009  . Hx of CABG 09/24/2009  . OTHER SPEC FORMS CHRONIC ISCHEMIC HEART DISEASE 09/24/2009  . Chronic atrial fibrillation 09/24/2009  . ATRIAL FLUTTER 09/24/2009  . UNSPECIFIED CEREBROVASCULAR DISEASE 09/24/2009  . COPD (chronic obstructive pulmonary disease) ([place]) 09/24/2009  . POSTSURGICAL AORTOCORONARY BYPASS STATUS 09/24/2009    Past Surgical History:  Procedure Laterality Date  . CARDIAC CATHETERIZATION  1987   "couldn't get thru"  . CAROTID ENDARTERECTOMY Right 01/2000   WITH DACRON PATCH  . CATARACT EXTRACTION W/ INTRAOCULAR LENS  IMPLANT, BILATERAL    . CORONARY ANGIOPLASTY WITH STENT PLACEMENT  05/16/2017  . [place]  CABG X 4  . CORONARY STENT INTERVENTION N/A 05/16/2017   Procedure: CORONARY STENT INTERVENTION;  Surgeon: Leonie Man, MD;  Location: [facility];  Service: Cardiovascular;  Laterality: N/A;  . INCISION AND DRAINAGE ABSCESS ANAL  1987  . INGUINAL HERNIA REPAIR Left   . INGUINAL HERNIA REPAIR Right 07/25/2018   Procedure: RIGHT HERNIA REPAIR INGUINAL ADULT WITH MESH;   Surgeon: [name], MD;  Location: [facility];  Service: General;  Laterality: Right;  . INSERTION OF MESH Right 07/25/2018   Procedure: INSERTION OF MESH;  Surgeon: [name], MD;  Location: [facility];  Service: General;  Laterality: Right;  . RIGHT/LEFT HEART CATH AND CORONARY/GRAFT ANGIOGRAPHY N/A 05/16/2017   Procedure: RIGHT/LEFT HEART CATH AND CORONARY/GRAFT ANGIOGRAPHY;  Surgeon: Leonie Man, MD;  Location: [facility];  Service: Cardiovascular;  Laterality: N/A;       Home Medications    Prior to Admission medications   Medication Sig Start Date End Date Taking? Authorizing Provider  albuterol (PROVENTIL HFA;VENTOLIN HFA) 108 (90 Base) MCG/ACT inhaler Inhale 2 puffs into the lungs every 6 (six) hours as needed for wheezing or shortness of breath.    [provider]  allopurinol (ZYLOPRIM) 300 MG tablet Take 300 mg by mouth daily.      [provider]  atorvastatin (LIPITOR) 40 MG tablet Take 1 tablet by mouth once daily 06/04/19   Minus Breeding, MD  clopidogrel (PLAVIX) 75 MG tablet Take 1 tablet (75 mg total) by mouth daily. 06/13/19   Minus Breeding, MD  furosemide (LASIX) 20 MG tablet Take 3 tablets by mouth once daily 05/14/19   Minus Breeding, MD  glyBURIDE (DIABETA) 5 MG tablet Take 5 mg by mouth at bedtime.    [provider]  HYDROcodone-acetaminophen (NORCO/VICODIN) 5-325 MG tablet One tablet every four hours as needed for acute pain.  Limit of five days per [address] statue. 08/26/16   Sanjuana Kava, MD  JARDIANCE 25 MG TABS tablet Take 25 mg by mouth at bedtime.  06/07/18   [provider]  metFORMIN (GLUCOPHAGE) 500 MG tablet Take 500-1,000 mg by mouth See admin instructions. Pt takes 1000mg  AM and 500mg  PM 10/12/14   [provider]  nitroGLYCERIN (NITROSTAT) 0.4 MG SL tablet Place 1 tablet (0.4 mg total) under the tongue every 5 (five) minutes x 3 doses as needed for chest pain. If no relief after the 3rd dose, proceed  to the ED for an evaluation 12/30/16 10/17/18  Minus Breeding, MD  Omega-3 Fatty Acids (FISH OIL) 1000 MG CAPS Take 1,000 mg by mouth daily.     [provider]  PROLENSA 0.07 % SOLN Place 1 drop into the right eye at bedtime. 04/23/19   [provider]  sacubitril-valsartan (ENTRESTO) 24-26 MG Take 1 tablet by mouth 2 (two) times daily. 10/17/18   Minus Breeding, MD  warfarin (COUMADIN) 5 MG tablet Take 5-7.5 mg by mouth See admin instructions. Takes 5 mg in the evening on Sun, take 7.5 mg in the evening Mon through Sat    [provider]    Family History Family History  Problem Relation Age of Onset  . Healthy Mother   . Heart attack Father     Social History Social History   Tobacco Use  . Smoking status: Former Smoker    Packs/day: 2.00    Years: 32.00    Pack years: 64.00    Types: Cigarettes    Quit date: 07/12/1985  Years since quitting: 34.0  . Smokeless tobacco: Former Systems developer    Types: [place] date: 07/12/1986  Substance Use Topics  . Alcohol use: No    Comment: [place] EtOH; "stopped in the 1990s"  . Drug use: No     Allergies   Augmentin [amoxicillin-pot clavulanate]   Review of Systems Review of Systems  Constitutional: Positive for fatigue.  HENT: Negative.   Respiratory: Positive for cough.   Cardiovascular: Negative.   Gastrointestinal: Positive for diarrhea.  Neurological: Negative.      Physical Exam Triage Vital Signs ED Triage Vitals  Enc Vitals Group     BP 07/13/19 1631 110/75     Pulse Rate 07/13/19 1631 99     Resp 07/13/19 1631 20     Temp 07/13/19 1631 98.1 F (36.7 C)     Temp Source 07/13/19 1631 Oral     SpO2 07/13/19 1631 94 %     Weight --      Height --      Head Circumference --      Peak Flow --      Pain Score 07/13/19 1633 0     Pain Loc --      Pain Edu? --      Excl. in [place]? --    No data found.  Updated Vital Signs BP 110/75 (BP Location: Right Arm)   Pulse 99   Temp 98.1 F  (36.7 C) (Oral)   Resp 20   SpO2 94%   Visual Acuity Right Eye Distance:   Left Eye Distance:   Bilateral Distance:    Right Eye Near:   Left Eye Near:    Bilateral Near:     Physical Exam Vitals and nursing note reviewed.  Constitutional:      General: He is not in acute distress.    Appearance: Normal appearance. He is normal weight. He is not ill-appearing or toxic-appearing.  HENT:     Head: Normocephalic.     Right Ear: Tympanic membrane, ear canal and external ear normal. There is no impacted cerumen.     Left Ear: Tympanic membrane, ear canal and external ear normal. There is no impacted cerumen.     Nose: Nose normal. No congestion.     Mouth/Throat:     Mouth: Mucous membranes are moist.     Pharynx: No oropharyngeal exudate or posterior oropharyngeal erythema.  Cardiovascular:     Rate and Rhythm: Normal rate and regular rhythm.     Pulses: Normal pulses.     Heart sounds: Normal heart sounds. No murmur.  Pulmonary:     Effort: Pulmonary effort is normal. No respiratory distress.     Breath sounds: No wheezing or rhonchi.  Chest:     Chest wall: No tenderness.  Abdominal:     General: Abdomen is flat. Bowel sounds are normal. There is no distension.     Palpations: There is no mass.     Tenderness: There is no abdominal tenderness.  Skin:    Capillary Refill: Capillary refill takes less than 2 seconds.  Neurological:     Mental Status: He is alert and oriented to person, place, and time.      UC Treatments / Results  Labs (all labs ordered are listed, but only abnormal results are displayed) Labs Reviewed  POCT FASTING CBG [place] - Abnormal; Notable for the following components:      Result Value   POCT Glucose ([place])  192 (*)    All other components within normal limits  NOVEL CORONAVIRUS, NAA    EKG   Radiology DG Chest 2 View  Result Date: 07/13/2019 CLINICAL DATA:  Cough EXAM: CHEST - 2 VIEW COMPARISON:  09/09/2018 FINDINGS:  Cardiomegaly status post median sternotomy and CABG. Both lungs are clear. Disc degenerative disease of the thoracic spine IMPRESSION: Cardiomegaly without acute abnormality of the lungs. Electronically Signed   By: Eddie Candle M.D.   On: 07/13/2019 17:37    Procedures Procedures (including critical care time)  Medications Ordered in UC Medications - No data to display  Initial Impression / Assessment and Plan / UC Course  I have reviewed the triage vital signs and the nursing notes.  Pertinent labs & imaging results that were available during my care of the patient were reviewed by me and considered in my medical decision making (see chart for details).   Point-of-care glucose test was completed and result reviewed.   Chest x-ray was ordered and results reviewed.  Results  show cardiomegaly.  COVID-19 test was ordered.  Patient is stable for discharge.  Advised patient to quarantine until Covid test result become available.  To go to ED for worsening of symptoms.  Patient verbalized an understanding of this plan of care.  Final Clinical Impressions(s) / UC Diagnoses   Final diagnoses:  Suspected COVID-19 virus infection     Discharge Instructions     COVID testing ordered.  It will take between 2-7 days for test results.  Someone will contact you regarding abnormal results.    In the meantime: You should remain isolated in your home for 10 days from symptom onset  Get plenty of rest and push fluids Tessalon Perles prescribed for cough Zyrtec-D prescribed for nasal congestion, runny nose, and/or sore throat Flonase prescribed for nasal congestion and runny nose Use medications daily for symptom relief Use OTC medications like ibuprofen or tylenol as needed fever or pain Call or go to the ED if you have any new or worsening symptoms such as fever, worsening cough, shortness of breath, chest tightness, chest pain, turning blue, changes in mental status, etc...     ED  Prescriptions    None     PDMP not reviewed this encounter.   Emerson Monte, FNP 07/13/19 1819

## 2019-07-15 LAB — NOVEL CORONAVIRUS, NAA: SARS-CoV-2, NAA: DETECTED — AB

## 2019-07-16 ENCOUNTER — Telehealth: Payer: Self-pay | Admitting: Nurse Practitioner

## 2019-07-16 NOTE — Telephone Encounter (Signed)
Called to Discuss with patient about Covid symptoms and the use of bamlanivimab, a monoclonal antibody infusion for those with mild to moderate Covid symptoms and at a high risk of hospitalization.     Pt is qualified for this infusion at the [facility] center due to co-morbid conditions and/or a member of an at-risk group.     Unable to reach pt  

## 2019-07-17 ENCOUNTER — Telehealth: Payer: Self-pay | Admitting: Emergency Medicine

## 2019-07-17 NOTE — Telephone Encounter (Signed)

## 2019-07-24 ENCOUNTER — Encounter (INDEPENDENT_AMBULATORY_CARE_PROVIDER_SITE_OTHER): Payer: Medicare Other | Admitting: Ophthalmology

## 2019-07-31 ENCOUNTER — Emergency Department (HOSPITAL_COMMUNITY)
Admission: EM | Admit: 2019-07-31 | Discharge: 2019-08-13 | Disposition: E | Payer: Medicare Other | Attending: Emergency Medicine | Admitting: Emergency Medicine

## 2019-07-31 DIAGNOSIS — I472 Ventricular tachycardia, unspecified: Secondary | ICD-10-CM

## 2019-07-31 DIAGNOSIS — Z7984 Long term (current) use of oral hypoglycemic drugs: Secondary | ICD-10-CM | POA: Insufficient documentation

## 2019-07-31 DIAGNOSIS — E119 Type 2 diabetes mellitus without complications: Secondary | ICD-10-CM | POA: Diagnosis not present

## 2019-07-31 DIAGNOSIS — Z79899 Other long term (current) drug therapy: Secondary | ICD-10-CM | POA: Diagnosis not present

## 2019-07-31 DIAGNOSIS — Z743 Need for continuous supervision: Secondary | ICD-10-CM | POA: Diagnosis not present

## 2019-07-31 DIAGNOSIS — I252 Old myocardial infarction: Secondary | ICD-10-CM | POA: Diagnosis not present

## 2019-07-31 DIAGNOSIS — I469 Cardiac arrest, cause unspecified: Secondary | ICD-10-CM | POA: Insufficient documentation

## 2019-07-31 DIAGNOSIS — Z87891 Personal history of nicotine dependence: Secondary | ICD-10-CM | POA: Diagnosis not present

## 2019-07-31 DIAGNOSIS — R404 Transient alteration of awareness: Secondary | ICD-10-CM | POA: Diagnosis not present

## 2019-07-31 DIAGNOSIS — I499 Cardiac arrhythmia, unspecified: Secondary | ICD-10-CM | POA: Diagnosis not present

## 2019-07-31 DIAGNOSIS — Z8616 Personal history of COVID-19: Secondary | ICD-10-CM | POA: Diagnosis not present

## 2019-07-31 DIAGNOSIS — Z7902 Long term (current) use of antithrombotics/antiplatelets: Secondary | ICD-10-CM | POA: Diagnosis not present

## 2019-07-31 DIAGNOSIS — Z7901 Long term (current) use of anticoagulants: Secondary | ICD-10-CM | POA: Insufficient documentation

## 2019-07-31 DIAGNOSIS — I25110 Atherosclerotic heart disease of native coronary artery with unstable angina pectoris: Secondary | ICD-10-CM | POA: Diagnosis not present

## 2019-07-31 DIAGNOSIS — Z951 Presence of aortocoronary bypass graft: Secondary | ICD-10-CM | POA: Diagnosis not present

## 2019-07-31 DIAGNOSIS — J449 Chronic obstructive pulmonary disease, unspecified: Secondary | ICD-10-CM | POA: Diagnosis not present

## 2019-07-31 DIAGNOSIS — R092 Respiratory arrest: Secondary | ICD-10-CM | POA: Diagnosis not present

## 2019-07-31 DIAGNOSIS — I10 Essential (primary) hypertension: Secondary | ICD-10-CM | POA: Diagnosis not present

## 2019-07-31 DIAGNOSIS — I4901 Ventricular fibrillation: Secondary | ICD-10-CM | POA: Diagnosis not present

## 2019-07-31 MED ORDER — AMIODARONE HCL IN DEXTROSE 360-4.14 MG/200ML-% IV SOLN: 60.0000 mg/h | INTRAVENOUS | Status: DC

## 2019-07-31 MED ORDER — MAGNESIUM SULFATE 50 % IJ SOLN
INTRAMUSCULAR | Status: AC | PRN
  Administered 2019-07-31: 2 g via INTRAVENOUS

## 2019-07-31 MED ORDER — EPINEPHRINE 1 MG/10ML IJ SOSY
PREFILLED_SYRINGE | INTRAMUSCULAR | Status: AC | PRN
  Administered 2019-07-31 (×5): 1 mg via INTRAVENOUS

## 2019-07-31 MED ORDER — EPINEPHRINE PF 1 MG/ML IJ SOLN
0.5000 ug/min | INTRAVENOUS | Status: DC
  Administered 2019-07-31: 5 ug/min via INTRAVENOUS

## 2019-07-31 MED ORDER — AMIODARONE HCL IN DEXTROSE 360-4.14 MG/200ML-% IV SOLN
INTRAVENOUS | Status: AC
  Administered 2019-07-31: 60 mg/h via INTRAVENOUS
  Filled 2019-07-31: qty 200

## 2019-07-31 MED ORDER — AMIODARONE HCL IN DEXTROSE 360-4.14 MG/200ML-% IV SOLN: 30.0000 mg/h | INTRAVENOUS | Status: DC

## 2019-07-31 MED ORDER — SODIUM BICARBONATE 8.4 % IV SOLN
INTRAVENOUS | Status: DC | PRN
  Administered 2019-07-31: 50 meq via INTRAVENOUS

## 2019-07-31 MED ORDER — EPINEPHRINE 1 MG/10ML IJ SOSY
PREFILLED_SYRINGE | INTRAMUSCULAR | Status: DC | PRN
  Administered 2019-07-31: 1 mg via INTRAVENOUS

## 2019-07-31 MED ORDER — EPINEPHRINE PF 1 MG/ML IJ SOLN
INTRAMUSCULAR | Status: AC
  Filled 2019-07-31: qty 4

## 2019-07-31 MED ORDER — SODIUM BICARBONATE 8.4 % IV SOLN
INTRAVENOUS | Status: AC
  Filled 2019-07-31: qty 100

## 2019-07-31 MED ORDER — CALCIUM CHLORIDE 10 % IV SOLN
INTRAVENOUS | Status: AC | PRN
  Administered 2019-07-31: 1 g via INTRAVENOUS

## 2019-07-31 MED ORDER — SODIUM BICARBONATE 8.4 % IV SOLN
INTRAVENOUS | Status: AC | PRN
  Administered 2019-07-31 (×2): 50 meq via INTRAVENOUS

## 2019-08-01 MED FILL — Medication: Qty: 1 | Status: AC

## 2019-08-02 ENCOUNTER — Encounter (INDEPENDENT_AMBULATORY_CARE_PROVIDER_SITE_OTHER): Payer: Medicare Other | Admitting: Ophthalmology

## 2019-08-13 NOTE — ED Notes (Signed)
Pulse noted hr 57, Dr Dina Rich remains at bedside,

## 2019-08-13 NOTE — Code Documentation (Signed)
Pt hr continued to drop to 50's. Pulses remain, Dr Dina Rich at bedside, additional orders given

## 2019-08-13 NOTE — ED Triage Notes (Addendum)
Pt was found unresponsive by family, ems and fire started cp at 5;00, pt was defibrillated a total of 7 times for v-fib on monitor, pt was given total of 6 doses of epi 1 mg by ems as well with return of pulses, upon arrival to er pt cpr in progress again, pt also given two separate doses of amiodarone 150 mg and 300 mg by ems enroute to er

## 2019-08-13 NOTE — ED Notes (Signed)
Pt hr decreasing to 44's, Dr Dina Rich at bedside along with family,

## 2019-08-13 NOTE — Code Documentation (Signed)
Pt in v-fib, shock given, cpr started

## 2019-08-13 NOTE — Code Documentation (Signed)
cpr continues,

## 2019-08-13 NOTE — Code Documentation (Addendum)
Patient time of death occurred at 07:01, Dr Dina Rich at bedside with family,

## 2019-08-13 NOTE — ED Provider Notes (Signed)
[facility] EMERGENCY DEPARTMENT Provider Note   CSN: [id number] Arrival date & time: 2019-08-09  4098     History Chief Complaint  Patient presents with  . Cardiac Arrest    Adrian Barrett is a 79 y.o. male.  HPI     This is an 79 year old male with a history of atrial fibrillation, congestive heart failure, COPD who was brought in by EMS post arrest.  EMS was called out after he was found by his family.  EMS reports 6-7 rounds of epinephrine, multiple shocks for ventricular fibrillation, 150 mg followed by 350 mg of amiodarone.  They subsequently got ROSC but in route, patient coded again.  Reportedly Covid positive on 1/2 but was asymptomatic per the wife and had finished his quarantine..  Level 5 caveat for acuity of condition  Past Medical History:  Diagnosis Date  . Arthritis    "neck, lower back" (05/16/2017)  . Atrial fibrillation ([place])   . Atrial flutter ([place])   . Congestive heart failure, unspecified    45 - 50%  . COPD (chronic obstructive pulmonary disease) ([place])   . Coronary atherosclerosis of native coronary artery   . Gout    "on daily RX:" (05/16/2017)  . History of ETOH abuse    Quit 20 years ago.   Marland Kitchen Hyperlipidemia   . Mitral valve insufficiency    Mild to mod on echo 2015  . Myocardial infarction ([place]) 1987  . Obesity   . Stroke [facility]) 01/2000   right posterior middle cerebral artery cerebrovascular accident/notes 51/11/2010; denies residual on 05/16/2017  . Type II diabetes mellitus ([place])   . Unspecified essential hypertension     Patient Active Problem List   Diagnosis Date Noted  . Dyslipidemia 06/11/2019  . Educated about COVID-19 virus infection 06/11/2019  . Permanent atrial fibrillation ([place]) 10/17/2018  . Coronary artery disease involving coronary bypass graft of native heart without angina pectoris 10/17/2018  . Fluid collection at surgical site   . Cellulitis 09/09/2018  . Hematoma 09/09/2018  . Sepsis ([place]) 09/09/2018  . Right  inguinal hernia status post open repair with mesh 07/24/2018 07/24/2018  . Chronic systolic HF (heart failure) ([place]) 02/01/2018  . Medication management 02/01/2018  . CAD S/P percutaneous coronary angioplasty 05/27/2017  . Chronic anticoagulation 05/27/2017  . Ischemic cardiomyopathy 05/16/2017  . Coronary artery disease involving native coronary artery of native heart with unstable angina pectoris ([place]) 05/16/2017  . Acute combined systolic and diastolic heart failure ([place]) 04/27/2017  . SOB (shortness of breath) 04/22/2017  . Carotid stenosis 12/14/2011  . Fatigue 12/14/2011  . Essential hypertension 12/14/2011  . Obesity, unspecified 09/25/2009  . Non-insulin dependent type 2 diabetes mellitus ([place]) 09/24/2009  . Type 2 diabetes mellitus with hyperlipidemia ([place]) 09/24/2009  . NONDEPENDENT ALCOHOL ABUSE UNSPEC DRUNKENNESS 09/24/2009  . MITRAL VALVE INSUFF&AORTIC VALVE INSUFF 09/24/2009  . DISEASES OF TRICUSPID VALVE 09/24/2009  . Hx of CABG 09/24/2009  . OTHER SPEC FORMS CHRONIC ISCHEMIC HEART DISEASE 09/24/2009  . Chronic atrial fibrillation 09/24/2009  . ATRIAL FLUTTER 09/24/2009  . UNSPECIFIED CEREBROVASCULAR DISEASE 09/24/2009  . COPD (chronic obstructive pulmonary disease) ([place]) 09/24/2009  . POSTSURGICAL AORTOCORONARY BYPASS STATUS 09/24/2009    Past Surgical History:  Procedure Laterality Date  . CARDIAC CATHETERIZATION  1987   "couldn't get thru"  . CAROTID ENDARTERECTOMY Right 01/2000   WITH DACRON PATCH  . CATARACT EXTRACTION W/ INTRAOCULAR LENS  IMPLANT, BILATERAL    . CORONARY ANGIOPLASTY WITH STENT PLACEMENT  05/16/2017  .  CORONARY ARTERY BYPASS GRAFT  1999   CABG X 4  . CORONARY STENT INTERVENTION N/A 05/16/2017   Procedure: CORONARY STENT INTERVENTION;  Surgeon: Leonie Man, MD;  Location: [place] [facility];  Service: Cardiovascular;  Laterality: N/A;  . INCISION AND DRAINAGE ABSCESS ANAL  1987  . INGUINAL HERNIA REPAIR Left   . INGUINAL HERNIA REPAIR  Right 07/25/2018   Procedure: RIGHT HERNIA REPAIR INGUINAL ADULT WITH MESH;  Surgeon: [name], MD;  Location: [facility];  Service: General;  Laterality: Right;  . INSERTION OF MESH Right 07/25/2018   Procedure: INSERTION OF MESH;  Surgeon: [name], MD;  Location: [facility];  Service: General;  Laterality: Right;  . RIGHT/LEFT HEART CATH AND CORONARY/GRAFT ANGIOGRAPHY N/A 05/16/2017   Procedure: RIGHT/LEFT HEART CATH AND CORONARY/GRAFT ANGIOGRAPHY;  Surgeon: Leonie Man, MD;  Location: [place] [facility];  Service: Cardiovascular;  Laterality: N/A;       Family History  Problem Relation Age of Onset  . Healthy Mother   . Heart attack Father     Social History   Tobacco Use  . Smoking status: Former Smoker    Packs/day: 2.00    Years: 32.00    Pack years: 64.00    Types: Cigarettes    Quit date: 07/12/1985    Years since quitting: 34.0  . Smokeless tobacco: Former Systems developer    Types: [place] date: 07/12/1986  Substance Use Topics  . Alcohol use: No    Comment: [place] EtOH; "stopped in the 1990s"  . Drug use: No    Home Medications Prior to Admission medications   Medication Sig Start Date End Date Taking? Authorizing Provider  albuterol (VENTOLIN HFA) 108 (90 Base) MCG/ACT inhaler Inhale 1-2 puffs into the lungs every 6 (six) hours as needed for wheezing or shortness of breath. 07/13/19   Avegno, Darrelyn Hillock, FNP  allopurinol (ZYLOPRIM) 300 MG tablet Take 300 mg by mouth daily.      [provider]  atorvastatin (LIPITOR) 40 MG tablet Take 1 tablet by mouth once daily 06/04/19   Minus Breeding, MD  benzonatate (TESSALON) 100 MG capsule Take 1 capsule (100 mg total) by mouth every 8 (eight) hours. 07/13/19   Avegno, Darrelyn Hillock, FNP  clopidogrel (PLAVIX) 75 MG tablet Take 1 tablet (75 mg total) by mouth daily. 06/13/19   Minus Breeding, MD  furosemide (LASIX) 20 MG tablet Take 3 tablets by mouth once daily 05/14/19   Minus Breeding, MD  glyBURIDE (DIABETA) 5 MG tablet  Take 5 mg by mouth at bedtime.    [provider]  HYDROcodone-acetaminophen (NORCO/VICODIN) 5-325 MG tablet One tablet every four hours as needed for acute pain.  Limit of five days per [place] statue. 08/26/16   Sanjuana Kava, MD  JARDIANCE 25 MG TABS tablet Take 25 mg by mouth at bedtime.  06/07/18   [provider]  metFORMIN (GLUCOPHAGE) 500 MG tablet Take 500-1,000 mg by mouth See admin instructions. Pt takes 1034m AM and 5064mPM 10/12/14   [provider]  nitroGLYCERIN (NITROSTAT) 0.4 MG SL tablet Place 1 tablet (0.4 mg total) under the tongue every 5 (five) minutes x 3 doses as needed for chest pain. If no relief after the 3rd dose, proceed to the ED for an evaluation 12/30/16 10/17/18  HoMinus BreedingMD  Omega-3 Fatty Acids (FISH OIL) 1000 MG CAPS Take 1,000 mg by mouth daily.     [provider]  PROLENSA 0.07 %  SOLN Place 1 drop into the right eye at bedtime. 04/23/19   [provider]  sacubitril-valsartan (ENTRESTO) 24-26 MG Take 1 tablet by mouth 2 (two) times daily. 10/17/18   Minus Breeding, MD  warfarin (COUMADIN) 5 MG tablet Take 5-7.5 mg by mouth See admin instructions. Takes 5 mg in the evening on Sun, take 7.5 mg in the evening Mon through Sat    [provider]    Allergies    Augmentin [amoxicillin-pot clavulanate]  Review of Systems   Review of Systems  Unable to perform ROS: Acuity of condition    Physical Exam Updated Vital Signs BP (!) 84/49   Pulse (!) 103   Resp 11   Physical Exam Vitals and nursing note reviewed. Exam conducted with a chaperone present.  Constitutional:      Comments: Unresponsive, GCS 3  HENT:     Head: Normocephalic and atraumatic.     Mouth/Throat:     Mouth: Mucous membranes are dry.  Eyes:     Comments: Pupils 8 mm and unreactive bilaterally  Cardiovascular:     Rate and Rhythm: Rhythm irregular.  Pulmonary:     Comments: Intubated, no spontaneous respiration Abdominal:      General: Abdomen is flat.  Musculoskeletal:        General: No swelling or tenderness.     Cervical back: Normal range of motion.  Skin:    Findings: Bruising present.  Neurological:     Comments: GCS 3, no purposeful movement  Psychiatric:     Comments: Unable to assess     ED Results / Procedures / Treatments   Labs (all labs ordered are listed, but only abnormal results are displayed) Labs Reviewed - No data to display  EKG EKG Interpretation  Date/Time:  22-Aug-2019 06:07:58 EST Ventricular Rate:  72 PR Interval:    QRS Duration: 208 QT Interval:  490 QTC Calculation: 537 R Axis:   -91 Text Interpretation: Accelerated junctional rhythm RBBB and LAFB Confirmed by Thayer Jew [phone]) on August 22, 2019 7:06:22 AM   Radiology No results found.  Procedures CPR  Date/Time: 08-22-2019 7:21 AM Performed by: Merryl Hacker, MD Authorized by: Merryl Hacker, MD  CPR Procedure Details:      Amount of time prior to administration of ACLS/BLS (minutes):  50   ACLS/BLS initiated by EMS: Yes     CPR/ACLS performed in the ED: Yes     Duration of CPR (minutes):  60   Outcome: Pt declared dead    CPR performed via ACLS guidelines under my direct supervision.  See RN documentation for details including defibrillator use, medications, doses and timing.   (including critical care time)  CRITICAL CARE Performed by: Merryl Hacker   Total critical care time: 75 minutes  Critical care time was exclusive of separately billable procedures and treating other patients.  Critical care was necessary to treat or prevent imminent or life-threatening deterioration.  Critical care was time spent personally by me on the following activities: development of treatment plan with patient and/or surrogate as well as nursing, discussions with consultants, evaluation of patient's response to treatment, examination of patient, obtaining history from patient or surrogate,  ordering and performing treatments and interventions, ordering and review of laboratory studies, ordering and review of radiographic studies, pulse oximetry and re-evaluation of patient's condition.   Medications Ordered in ED Medications  EPINEPHrine (ADRENALIN) 4 mg in dextrose 5 % 250 mL (0.016 mg/mL) infusion (0 mcg/min Intravenous  Stopped August 06, 2019 0703)  amiodarone (NEXTERONE PREMIX) 360-4.14 MG/200ML-% (1.8 mg/mL) IV infusion (0 mg/hr Intravenous Stopped Aug 06, 2019 0703)    Followed by  amiodarone (NEXTERONE PREMIX) 360-4.14 MG/200ML-% (1.8 mg/mL) IV infusion (0 mg/hr Intravenous Hold 08/06/19 0703)  EPINEPHrine (ADRENALIN) 1 MG/10ML injection (1 mg Intravenous Given 08/06/19 0647)  sodium bicarbonate injection (50 mEq Intravenous Given 2019/08/06 0647)  EPINEPHrine (ADRENALIN) 1 MG/10ML injection (1 mg Intravenous Given 2019/08/06 5883)  calcium chloride injection (1 g Intravenous Given August 06, 2019 0610)  magnesium sulfate (IV Push/IM) injection (2 g Intravenous Given 08-06-19 0612)  sodium bicarbonate injection ( Intravenous Canceled Entry 2019/08/06 0645)    ED Course  I have reviewed the triage vital signs and the nursing notes.  Pertinent labs & imaging results that were available during my care of the patient were reviewed by me and considered in my medical decision making (see chart for details).    MDM Rules/Calculators/A&P                      Patient presents with CPR in progress.  Reported ventricular arrest.  He had approximately 1 additional hour of CPR.  At different intervals he had both PEA and V. tach.  He was shocked 3 times and had multiple rounds of epinephrine, magnesium, calcium chloride, and bicarbonate.  Patient continued to arrest despite epi drip and amiodarone drip.  He appeared to be in a malignant arrhythmia.  I met with the patient's daughter and wife.  I discussed his dire situation.  Do not feel he would have a meaningful outcome I do not feel the further CPR would  prolong his life meaningfully.  Decision was made to not escalate care.  Patient ultimately died 27.   Final Clinical Impression(s) / ED Diagnoses Final diagnoses:  V-tach [place])  Cardiac arrest [facility])    Rx / DC Orders ED Discharge Orders    None       Anh Mangano, Barbette Hair, MD 08/06/19 [phone]

## 2019-08-13 NOTE — ED Notes (Signed)
cpr continues,

## 2019-08-13 DEATH — deceased

## 2020-06-15 IMAGING — DX DG CHEST 1V PORT
1 series · 1 of 1 positions shown · non-contrast
Comparison: Chest radiograph dated [DATE] 7

CLINICAL DATA: 77-year-old male with sepsis.

EXAM:
PORTABLE CHEST 1 VIEW

[chest ap]
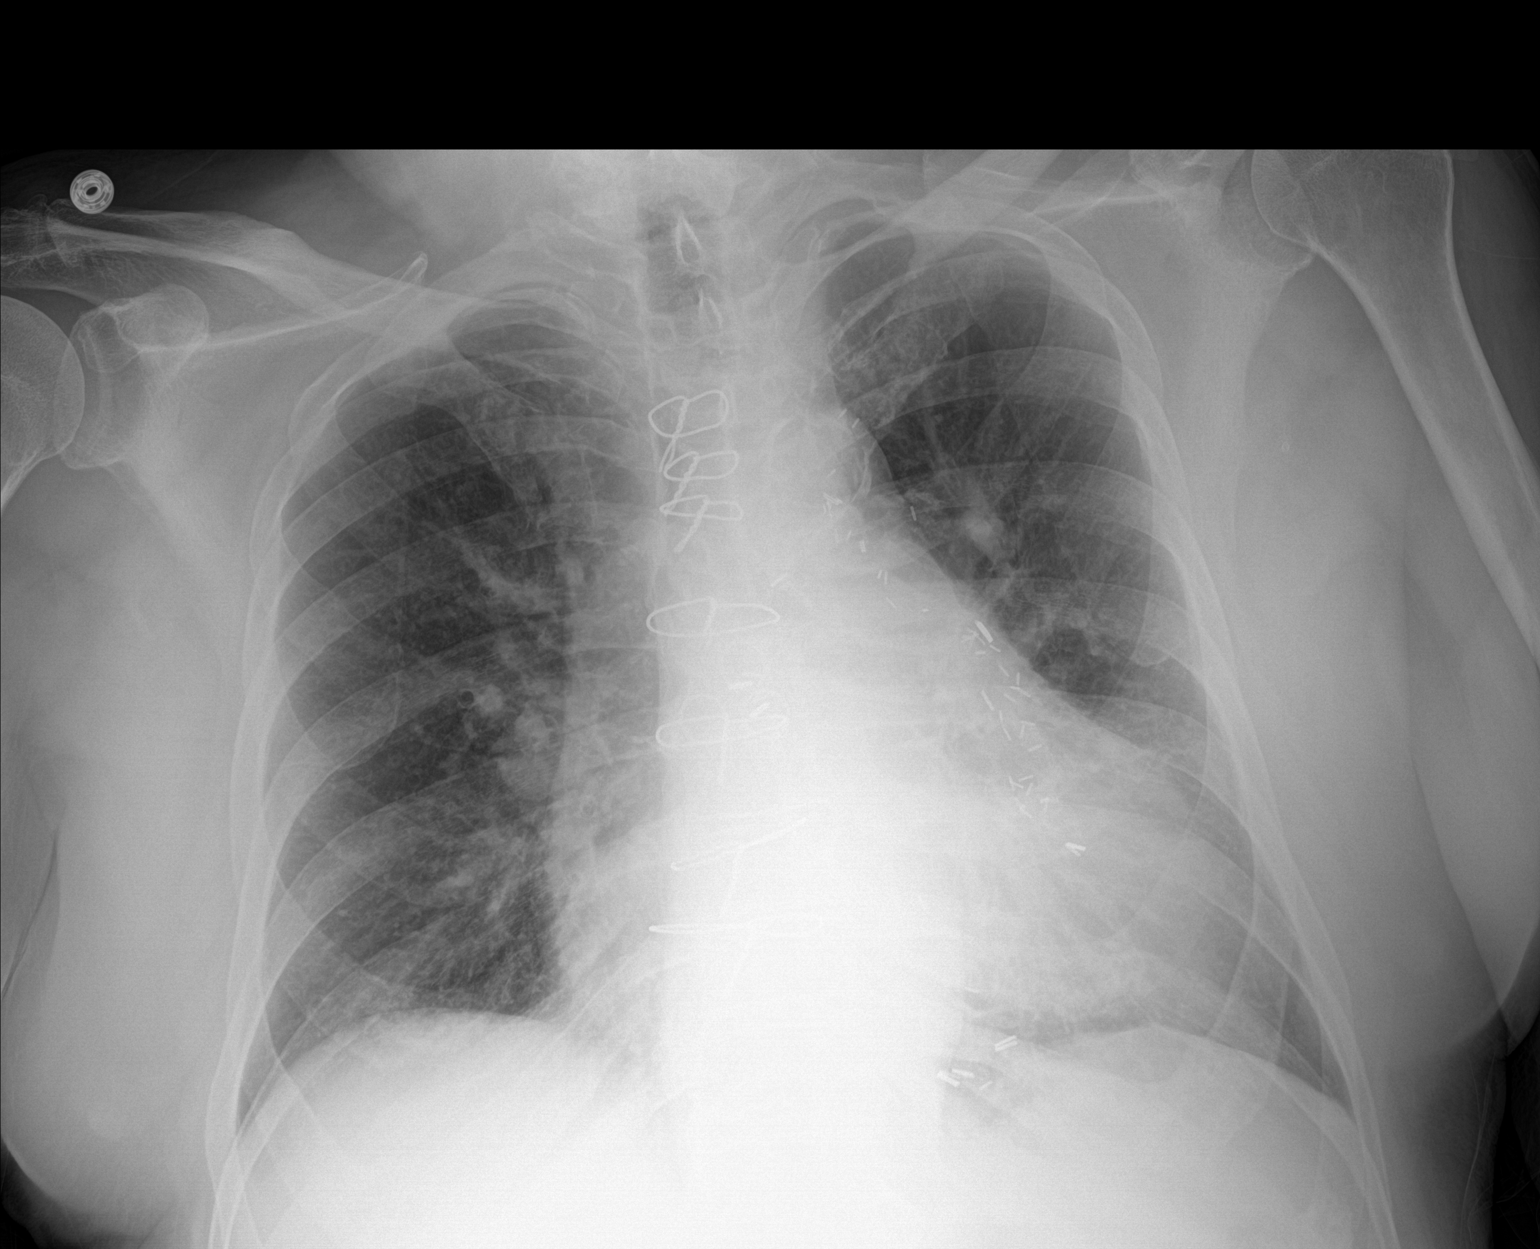

[1 of 1 positions shown; findings below may reference images not displayed]

FINDINGS: There is cardiomegaly with mild vascular congestion. No edema. No
focal consolidation, pleural effusion, or pneumothorax. Median
sternotomy wires and CABG vascular clips. No acute osseous
pathology.
IMPRESSION: 1. No acute cardiopulmonary process.
2. Cardiomegaly.

## 2024-06-14 ENCOUNTER — Encounter (HOSPITAL_COMMUNITY): Payer: Self-pay | Admitting: Surgery
